# Patient Record
Sex: Male | Born: 1955 | Hispanic: Yes | Marital: Married | Smoking: Never smoker
Health system: Southern US, Community
[De-identification: ages and names within clinical notes are randomized; demographics above are authoritative.]

## PROBLEM LIST (undated history)

## (undated) DIAGNOSIS — I10 Essential (primary) hypertension: Secondary | ICD-10-CM

## (undated) DIAGNOSIS — K5900 Constipation, unspecified: Secondary | ICD-10-CM

## (undated) DIAGNOSIS — E785 Hyperlipidemia, unspecified: Secondary | ICD-10-CM

## (undated) DIAGNOSIS — C801 Malignant (primary) neoplasm, unspecified: Secondary | ICD-10-CM

## (undated) DIAGNOSIS — E119 Type 2 diabetes mellitus without complications: Secondary | ICD-10-CM

## (undated) DIAGNOSIS — M109 Gout, unspecified: Secondary | ICD-10-CM

---

## 2017-10-08 ENCOUNTER — Ambulatory Visit: Payer: Self-pay | Admitting: Physician Assistant

## 2017-10-08 ENCOUNTER — Telehealth (HOSPITAL_COMMUNITY): Payer: Self-pay | Admitting: Emergency Medicine

## 2017-10-08 ENCOUNTER — Encounter (HOSPITAL_COMMUNITY): Payer: Self-pay | Admitting: Emergency Medicine

## 2017-10-08 ENCOUNTER — Ambulatory Visit (HOSPITAL_COMMUNITY)
Admission: EM | Admit: 2017-10-08 | Discharge: 2017-10-08 | Disposition: A | Discharge status: Home / Self Care | Payer: Self-pay | Attending: Urgent Care | Admitting: Urgent Care

## 2017-10-08 ENCOUNTER — Ambulatory Visit (INDEPENDENT_AMBULATORY_CARE_PROVIDER_SITE_OTHER): Payer: Self-pay

## 2017-10-08 DIAGNOSIS — J189 Pneumonia, unspecified organism: Secondary | ICD-10-CM

## 2017-10-08 DIAGNOSIS — R0789 Other chest pain: Secondary | ICD-10-CM

## 2017-10-08 DIAGNOSIS — R05 Cough: Secondary | ICD-10-CM

## 2017-10-08 DIAGNOSIS — R059 Cough, unspecified: Secondary | ICD-10-CM

## 2017-10-08 MED ORDER — HYDROCODONE-HOMATROPINE 5-1.5 MG/5ML PO SYRP: 5.0000 mL | ORAL_SOLUTION | Freq: Every evening | ORAL | 0 refills | Status: DC | PRN

## 2017-10-08 MED ORDER — LEVOFLOXACIN 750 MG PO TABS: 750.0000 mg | ORAL_TABLET | Freq: Every day | ORAL | 0 refills | Status: DC

## 2017-10-08 NOTE — ED Provider Notes (Signed)
  MRN: [id number] DOB: [birth year]  Subjective:   Tim Vincent is a 62 y.o. male presenting for 2 week history of persistent productive cough that elicits chest pain, mild shob. Patient is currently being treated for mumps. Admits that this started out after he had wisdom tooth extraction. He was thought to have a subsequent infection from this but then was seen by another physician that started treatment for mumps.  He is also taking methisoprinol as prescribed by the same physician that is treating his mumps.  No current facility-administered medications for this encounter.   Current Outpatient Medications:  .  azithromycin (ZITHROMAX) 500 MG tablet, Take by mouth daily., Disp: , Rfl:  .  naproxen sodium (ANAPROX) 275 MG tablet, Take 275 mg by mouth 2 (two) times daily with a meal., Disp: , Rfl:  .  RIBAVIRIN PO, Take by mouth., Disp: , Rfl:    No Known Allergies. Past medical history includes mumps. Past surgical history includes wisdom tooth extraction.   Objective:   Vitals: BP (!) 143/101   Pulse 81   Temp 98 F (36.7 C)   Resp 16   SpO2 100%   Physical Exam  Constitutional: He is oriented to person, place, and time. He appears well-developed and well-nourished.  Eyes: Pupils are equal, round, and reactive to light. EOM are normal. Right eye exhibits no discharge. Left eye exhibits no discharge. No scleral icterus.  Neck:  Right sided submandibular lymphadenopathy.  Cardiovascular: Normal rate, regular rhythm, normal heart sounds and intact distal pulses. Exam reveals no gallop and no friction rub.  No murmur heard. Pulmonary/Chest: Effort normal and breath sounds normal. No stridor. No respiratory distress. He has no wheezes. He has no rales.  Diminished lung sounds bilaterally over lower lung fields.  Neurological: He is alert and oriented to person, place, and time.  Skin: Skin is warm and dry.  Psychiatric: He has a normal mood and affect.   Dg Chest 2 View  Result  Date: 10/08/2017 CLINICAL DATA:  Two weeks of coughing and chest pain. Recently diagnosed with months. Nonsmoker. EXAM: CHEST - 2 VIEW COMPARISON:  None in PACs FINDINGS: The lungs are adequately inflated. There is increased density in the left lower lobe and likely in the right lower lobe medially. There is no pleural effusion. The cardiac silhouette is enlarged. The pulmonary vascularity is not engorged. There are calcified lymph nodes in the hilar region bilaterally. The observed bony thorax is normal. IMPRESSION: Probable left lower lobe pneumonia posteriorly. Subsegmental atelectasis or early pneumonia in the right infrahilar region. Followup PA and lateral chest X-ray is recommended in 3-4 weeks following trial of antibiotic therapy to ensure resolution and exclude underlying malignancy. Mild cardiomegaly without pulmonary vascular congestion. Electronically Signed   By: David  SwazilandJordan M.D.   On: 10/08/2017 13:59   Assessment and Plan :   Community acquired pneumonia of left lung, unspecified part of lung  Community acquired pneumonia of right lung, unspecified part of lung  Cough  Atypical chest pain  Patient is to stop taking azithromycin. He is to start levofloxacin. Counseled on need for follow up, need for repeat chest x-ray in 3-4 weeks. Use supportive care otherwise for cough, pain. Recommended he follow up here or with his physician treating mumps.    Wallis BambergMani, Reyanna Baley, PA-C 10/08/17 1423

## 2017-10-08 NOTE — Discharge Instructions (Addendum)
Para el dolor de garganta intente usar un t de miel. Use 3 cucharaditas de miel con jugo exprimido de medio limn. Coloque las piezas de jengibre afeitadas en 1/2 - 1 taza de agua y caliente sobre la estufa. Luego mezcle los ingredientes y repita cada 4 horas.  

## 2017-10-08 NOTE — Telephone Encounter (Signed)
Patient's family member called reporting medication not available at pharmacy it was sent to .  Was told it could take 2 days to get hycodan.  [facility]ing [facility].  Already changed this in computer.  Notified provider

## 2017-10-08 NOTE — ED Triage Notes (Signed)
Pt presents with medications, but the formulary is not recognized by our system.  Pt had three oral medications and one liquid of unknown route.  Koptin was a medication recognized as Azithromycin and Naproxeno as Aleve.  Virazide and Metisoprinol were not recognized.

## 2017-10-08 NOTE — ED Triage Notes (Signed)
Per family member, pt c/o coughing x2 weeks, pt family states he was recently dx with mumps.

## 2017-10-09 ENCOUNTER — Telehealth (HOSPITAL_COMMUNITY): Payer: Self-pay | Admitting: Emergency Medicine

## 2017-10-09 NOTE — Telephone Encounter (Signed)
Patients family called last night.  Tim Vincent, [place] wrote a hard copy script for hycodan.  Maggie Mean, Patient Access available to call patient and instruct on hard copy script.    This afternoon 13:50, patient access staff reports patient/family did come to get script and identity verified.

## 2017-10-31 ENCOUNTER — Ambulatory Visit (INDEPENDENT_AMBULATORY_CARE_PROVIDER_SITE_OTHER): Payer: Self-pay

## 2017-10-31 ENCOUNTER — Ambulatory Visit (INDEPENDENT_AMBULATORY_CARE_PROVIDER_SITE_OTHER): Payer: Self-pay | Admitting: Emergency Medicine

## 2017-10-31 ENCOUNTER — Encounter: Payer: Self-pay | Admitting: Emergency Medicine

## 2017-10-31 VITALS — BP 173/85 | HR 60 | Temp 98.3°F | Resp 17 | Ht 66.0 in | Wt 183.0 lb

## 2017-10-31 DIAGNOSIS — R05 Cough: Secondary | ICD-10-CM

## 2017-10-31 DIAGNOSIS — R059 Cough, unspecified: Secondary | ICD-10-CM

## 2017-10-31 DIAGNOSIS — R030 Elevated blood-pressure reading, without diagnosis of hypertension: Secondary | ICD-10-CM | POA: Insufficient documentation

## 2017-10-31 MED ORDER — LISINOPRIL 10 MG PO TABS: 10.0000 mg | ORAL_TABLET | Freq: Every day | ORAL | 3 refills | Status: DC

## 2017-10-31 NOTE — Patient Instructions (Addendum)
If you have lab work done today you will be contacted with your lab results within the next 2 weeks.  If you have not heard from [place] then please contact us. The fastest way to get your results is to register for My Chart.   IF you received an x-ray today, you will receive an invoice from [facility]. Please contact [facility] at [phone] with questions or concerns regarding your invoice.   IF you received labwork today, you will receive an invoice from [facility]. Please contact [facility] at [phone] with questions or concerns regarding your invoice.   Our billing staff will not be able to assist you with questions regarding bills from these companies.  You will be contacted with the lab results as soon as they are available. The fastest way to get your results is to activate your My Chart account. Instructions are located on the last page of this paperwork. If you have not heard from [place] regarding the results in 2 weeks, please contact this office.      Hipertensin Hypertension La hipertensin, conocida comnmente como presin arterial alta, se produce cuando la sangre bombea en las arterias con mucha fuerza. Las arterias son los vasos sanguneos que transportan la sangre desde el corazn al resto del cuerpo. La hipertensin hace que el corazn haga ms esfuerzo para Insurance account manager y [employer] que las arterias se Armed forces training and education officer o Multimedia programmer. La hipertensin no tratada o no controlada puede causar infarto de miocardio, accidentes cerebrovasculares, enfermedad renal y otros problemas. Una lectura de la presin arterial consiste de un nmero ms alto sobre un nmero ms bajo. En condiciones ideales, la presin arterial debe estar por debajo de 120/80. El primer nmero ("superior") es la presin sistlica. Es la medida de la presin de las arterias cuando el corazn late. El segundo nmero ("inferior") es la presin diastlica. Es la medida de la presin en las arterias  cuando el corazn se relaja. Cules son las causas? Se desconoce la causa de esta afeccin. Qu incrementa el riesgo? Algunos factores de riesgo de hipertensin estn bajo su control. Otros no. Factores que puede [employer].  Tener diabetes mellitus tipo 2, colesterol alto, o ambos.  No hacer la cantidad suficiente de actividad fsica o ejercicio.  Tener sobrepeso.  Consumir mucha grasa, azcar, caloras o sal (sodio) en su dieta.  Beber alcohol en exceso. Factores que son difciles o imposibles de modificar  Tener enfermedad renal crnica.  Tener antecedentes familiares de presin arterial alta.  La edad. Los riesgos aumentan con la edad.  La raza. El riesgo es mayor para las Statistician.  El sexo. Antes de los 45aos, los hombres corren ms [employer]. Despus de los 65aos, las mujeres corren ms [employer].  Tener apnea obstructiva del sueo.  El estrs. Cules son los signos o los sntomas? La presin arterial extremadamente alta (crisis hipertensiva) puede provocar:  Dolor de [place].  Ansiedad.  Falta de aire.  Hemorragia nasal.  Nuseas y vmitos.  Dolor de pecho intenso.  Una crisis de movimientos que no puede controlar (convulsiones).  Cmo se diagnostica? Esta afeccin se diagnostica midiendo su presin arterial mientras se encuentra sentado, con el brazo apoyado sobre una superficie. El brazalete del tensimetro debe colocarse directamente sobre la piel de la parte superior del brazo y al nivel de su corazn. Debe medirla al [facility] veces en el mismo brazo. Determinadas condiciones pueden causar una diferencia de presin arterial entre  el brazo izquierdo y Aeronautical engineer. Ciertos factores pueden provocar que las lecturas de la presin arterial sean inferiores o superiores a lo normal (elevadas) por un perodo corto de tiempo:  Si su presin arterial es ms alta cuando se encuentra en el consultorio del  mdico que cuando la mide en su hogar, se denomina "hipertensin de bata blanca". La Harley[employer]Davidson de las personas que tienen esta afeccin no deben ser [employer].  Si su presin arterial es ms alta en el hogar que cuando se encuentra en el consultorio del mdico, se denomina "hipertensin enmascarada". La Harley[employer]Davidson de las personas que tienen esta afeccin deben ser medicadas para Chief Operating Officer la presin arterial.  Si tiene una lecturas de presin arterial alta durante una visita o si tiene presin arterial normal con otros factores de riesgo:  Es posible que se le pida que regrese Banker para volver a Chief Operating Officer su presin arterial.  Se le puede pedir que se controle la presin arterial en su casa durante 1 semana o ms.  Si se le diagnostica hipertensin, es posible que se le realicen otros anlisis de sangre o estudios de diagnstico por imgenes para ayudar a su mdico a comprender su riesgo general de tener otras afecciones. Cmo se trata? Esta afeccin se trata haciendo cambios saludables en el estilo de vida, tales como ingerir alimentos saludables, realizar ms ejercicio y reducir el consumo de alcohol. El mdico puede recetarle medicamentos si los cambios en el estilo de vida no son suficientes para Museum/gallery curator la presin arterial y si:  Su presin arterial sistlica est por encima de 130.  Su presin arterial diastlica est por encima de 80.  La presin arterial deseada puede variar en funcin de las enfermedades, la edad y otros factores personales. Siga estas instrucciones en su casa: Comida y bebida  Siga una dieta con alto contenido de fibras y [place], y con bajo contenido de sodio, International aid/development worker agregada y Neurosurgeon. Un ejemplo de plan alimenticio es la dieta DASH (Dietary Approaches to Stop Hypertension, Mtodos alimenticios para detener la hipertensin). Para alimentarse de esta manera: ? Coma mucha fruta y verdura fresca. Trate de que la mitad del plato de cada comida sea de frutas y  verduras. ? Coma cereales integrales, como pasta integral, arroz integral y pan integral. Llene aproximadamente un cuarto del plato con cereales integrales. ? Coma y beba productos lcteos con bajo contenido de grasa, como leche descremada o yogur bajo en grasas. ? Evite la ingesta de cortes de carne grasa, carne procesada o curada, y carne de ave con piel. Llene aproximadamente un cuarto del plato con protenas magras, como pescado, pollo sin piel, frijoles, huevos y tofu. ? Evite ingerir alimentos prehechos o procesados. En general, estos tienen mayor cantidad de sodio, azcar agregada y Steffanie [place].  Reduzca su ingesta diaria de sodio. La mayora de las personas que tienen hipertensin deben comer menos de 1500 mg de sodio por [employer].  Limite el consumo de alcohol a no ms de 1 medida por da si es mujer y no est Orthoptist y a 2 medidas por da si es hombre. Una medida equivale a 12onzas de cerveza, 5onzas de vino o 1onzas de bebidas alcohlicas de alta graduacin. Estilo de vida  Trabaje con su mdico para mantener un peso saludable o Curator. Pregntele cual es su peso recomendado.  Realice al menos 30 minutos de ejercicio que haga que se acelere su corazn (ejercicio Magazine features editor) la DIRECTV de la [place]. Estas actividades pueden incluir  caminar, nadar o andar en bicicleta.  Incluya ejercicios para fortalecer sus msculos (ejercicios de resistencia), como pilates o levantamiento de pesas, como parte de su rutina semanal de ejercicios. Intente realizar 30minutos de este tipo de ejercicios al Kelloggmenos tres das a la Lock[place]semana.  No consuma ningn producto que contenga nicotina o tabaco, como cigarrillos y Administrator, Civil Servicecigarrillos electrnicos. Si necesita ayuda para dejar de fumar, consulte al mdico.  Contrlese la presin arterial en su casa segn las indicaciones del mdico.  Concurra a todas las visitas de control como se lo haya indicado el mdico. Esto es importante. Medicamentos  Intel Corporationome los  medicamentos de venta libre y los recetados solamente como se lo haya indicado el mdico. Siga cuidadosamente las indicaciones. Los medicamentos para la presin arterial deben tomarse segn las indicaciones.  No omita las dosis de medicamentos para la presin arterial. Si lo hace, estar en riesgo de tener problemas y puede hacer que los medicamentos sean menos eficaces.  Pregntele a su mdico a qu efectos secundarios o reacciones a los Museum/gallery curatormedicamentos debe prestar atencin. Comunquese con un mdico si:  Piensa que tiene una reaccin a un medicamento que est tomando.  Tiene dolores de cabeza frecuentes (recurrentes).  Siente mareos.  Tiene hinchazn en los tobillos.  Tiene problemas de visin. Solicite ayuda de inmediato si:  Siente un dolor de cabeza intenso o confusin.  Siente debilidad inusual o adormecimiento.  Siente que va a desmayarse.  Siente un dolor intenso en el pecho o el abdomen.  Vomita repetidas veces.  Tiene dificultad para respirar. Resumen  La hipertensin se produce cuando la sangre bombea en las arterias con mucha fuerza. Si esta afeccin no se controla, podra correr riesgo de tener complicaciones graves.  La presin arterial deseada puede variar en funcin de las enfermedades, la edad y otros factores personales. Para la Franklin Resourcesmayora de las personas, una presin arterial normal es menor que 120/80.  La hipertensin se trata con cambios en el estilo de vida, medicamentos o una combinacin de [place]. Los Danaher Corporationcambios en el estilo de vida incluyen prdida de peso, ingerir alimentos sanos, seguir una dieta baja en sodio, hacer ms ejercicio y Glass blower/designerlimitar el consumo de alcohol. Esta informacin no tiene Theme park managercomo fin reemplazar el consejo del mdico. Asegrese de hacerle al mdico cualquier pregunta que tenga. Document Released: 02/25/2005 Document Revised: 02/07/2016 Document Reviewed: 02/07/2016 Elsevier Interactive Patient Education  [employer]2018 Elsevier Inc.

## 2017-10-31 NOTE — Progress Notes (Signed)
Tim Vincent 62 y.o.   Chief Complaint  Patient presents with  . Follow-up    pneumonia     HISTORY OF PRESENT ILLNESS: This is a 62 y.o. male seen at urgent care center on 10/08/2017 and diagnosed with pneumonia.  Finished course of Levaquin 7 days ago.  Feeling better.  Has no complaints except dry cough.  Here for follow-up.  HPI   Prior to Admission medications   Medication Sig Start Date End Date Taking? Authorizing Provider  azithromycin (ZITHROMAX) 500 MG tablet Take by mouth daily.    [provider]  HYDROcodone-homatropine (HYCODAN) 5-1.5 MG/5ML syrup Take 5 mLs by mouth at bedtime as needed. Patient not taking: Reported on 10/31/2017 10/08/17   Wallis Bamberg, PA-C  levofloxacin (LEVAQUIN) 750 MG tablet Take 1 tablet (750 mg total) by mouth daily. Patient not taking: Reported on 10/31/2017 10/08/17   Wallis Bamberg, PA-C  naproxen sodium (ANAPROX) 275 MG tablet Take 275 mg by mouth 2 (two) times daily with a meal.    [provider]  RIBAVIRIN PO Take by mouth.    [provider]    No Known Allergies  There are no active problems to display for this patient.   No past medical history on file.  No past surgical history on file.  Social History   Socioeconomic History  . Marital status: Married    Spouse name: Not on file  . Number of children: Not on file  . Years of education: Not on file  . Highest education level: Not on file  Occupational History  . Not on file  Social Needs  . Financial resource strain: Not on file  . Food insecurity:    Worry: Not on file    Inability: Not on file  . Transportation needs:    Medical: Not on file    Non-medical: Not on file  Tobacco Use  . Smoking status: Never Smoker  . Smokeless tobacco: Never Used  Substance and Sexual Activity  . Alcohol use: Not on file  . Drug use: Not on file  . Sexual activity: Not on file  Lifestyle  . Physical activity:    Days per week: Not on file    Minutes  per session: Not on file  . Stress: Not on file  Relationships  . Social connections:    Talks on phone: Not on file    Gets together: Not on file    Attends religious service: Not on file    Active member of club or organization: Not on file    Attends meetings of clubs or organizations: Not on file    Relationship status: Not on file  . Intimate partner violence:    Fear of current or ex partner: Not on file    Emotionally abused: Not on file    Physically abused: Not on file    Forced sexual activity: Not on file  Other Topics Concern  . Not on file  Social History Narrative  . Not on file    No family history on file.   Review of Systems  Constitutional: Negative.  Negative for chills and fever.  HENT: Negative for congestion, nosebleeds and sore throat.   Eyes: Negative.  Negative for blurred vision and double vision.  Respiratory: Positive for cough. Negative for sputum production, shortness of breath and wheezing.   Cardiovascular: Negative.  Negative for chest pain, palpitations, orthopnea, leg swelling and PND.  Gastrointestinal: Negative for abdominal pain, nausea and vomiting.  Genitourinary: Negative.   Skin: Negative.  Negative for rash.  Neurological: Negative.   Endo/Heme/Allergies: Negative.   All other systems reviewed and are negative.   Vitals:   10/31/17 1006  BP: (!) 173/85  Pulse: 60  Resp: 17  Temp: 98.3 F (36.8 C)  SpO2: 98%   BP Readings from Last 3 Encounters:  10/31/17 (!) 173/85  10/08/17 (!) 143/101    Dg Chest 2 View  Result Date: 10/31/2017 CLINICAL DATA:  Pneumonia follow-up. EXAM: CHEST - 2 VIEW COMPARISON:  Chest x-ray dated October 08, 2017. FINDINGS: Stable mild cardiomegaly. Normal pulmonary vascularity. No focal consolidation, pleural effusion, or pneumothorax. No acute osseous abnormality. IMPRESSION: Resolved left lower lobe pneumonia. No active cardiopulmonary disease. Electronically Signed   By: Obie Dredge M.D.   On:  10/31/2017 10:55    Physical Exam  Constitutional: He appears well-developed and well-nourished.  HENT:  Head: Normocephalic and atraumatic.  Nose: Nose normal.  Mouth/Throat: Oropharynx is clear and moist.  Eyes: Pupils are equal, round, and reactive to light. Conjunctivae and EOM are normal.  Neck: Normal range of motion.  Cardiovascular: Normal rate, regular rhythm and normal heart sounds.  Pulmonary/Chest: Effort normal and breath sounds normal.  Abdominal: Soft. There is no tenderness.  Musculoskeletal: Normal range of motion. He exhibits no edema or tenderness.  Lymphadenopathy:    He has no cervical adenopathy.  Neurological: He is alert. No sensory deficit. He exhibits normal muscle tone.  Skin: Skin is warm and dry. Capillary refill takes less than 2 seconds.  Psychiatric: He has a normal mood and affect.  Vitals reviewed.    ASSESSMENT & PLAN: Tim Vincent was seen today for follow-up.  Diagnoses and all orders for this visit:  Cough -     DG Chest 2 View; Future  Elevated blood pressure reading -     lisinopril (PRINIVIL,ZESTRIL) 10 MG tablet; Take 1 tablet (10 mg total) by mouth daily.    Patient Instructions       If you have lab work done today you will be contacted with your lab results within the next 2 weeks.  If you have not heard from [place] then please contact us. The fastest way to get your results is to register for My Chart.   IF you received an x-ray today, you will receive an invoice from [facility]. Please contact [facility] at [phone] with questions or concerns regarding your invoice.   IF you received labwork today, you will receive an invoice from [place]. Please contact [facility] at [phone] with questions or concerns regarding your invoice.   Our billing staff will not be able to assist you with questions regarding bills from these companies.  You will be contacted with the lab results as soon as they are available.  The fastest way to get your results is to activate your My Chart account. Instructions are located on the last page of this paperwork. If you have not heard from [place] regarding the results in 2 weeks, please contact this office.      Hipertensin Hypertension La hipertensin, conocida comnmente como presin arterial alta, se produce cuando la sangre bombea en las arterias con mucha fuerza. Las arterias son los vasos sanguneos que transportan la sangre desde el corazn al resto del cuerpo. La hipertensin hace que el corazn haga ms esfuerzo para Insurance account manager y [employer] que las arterias se Armed forces training and education officer o Multimedia programmer. La hipertensin no tratada o no controlada puede causar infarto de miocardio, accidentes cerebrovasculares,  enfermedad renal y otros problemas. Una lectura de la presin arterial consiste de un nmero ms alto sobre un nmero ms bajo. En condiciones ideales, la presin arterial debe estar por debajo de 120/80. El primer nmero ("superior") es la presin sistlica. Es la medida de la presin de las arterias cuando el corazn late. El segundo nmero ("inferior") es la presin diastlica. Es la medida de la presin en las arterias cuando el corazn se relaja. Cules son las causas? Se desconoce la causa de esta afeccin. Qu incrementa el riesgo? Algunos factores de riesgo de hipertensin estn bajo su control. Otros no. Factores que puede [employer].  Tener diabetes mellitus tipo 2, colesterol alto, o ambos.  No hacer la cantidad suficiente de actividad fsica o ejercicio.  Tener sobrepeso.  Consumir mucha grasa, azcar, caloras o sal (sodio) en su dieta.  Beber alcohol en exceso. Factores que son difciles o imposibles de modificar  Tener enfermedad renal crnica.  Tener antecedentes familiares de presin arterial alta.  La edad. Los riesgos aumentan con la edad.  La raza. El riesgo es mayor para las Statistician.  El sexo. Antes de los  45aos, los hombres corren ms [employer]. Despus de los 65aos, las mujeres corren ms [employer].  Tener apnea obstructiva del sueo.  El estrs. Cules son los signos o los sntomas? La presin arterial extremadamente alta (crisis hipertensiva) puede provocar:  Dolor de [place].  Ansiedad.  Falta de aire.  Hemorragia nasal.  Nuseas y vmitos.  Dolor de pecho intenso.  Una crisis de movimientos que no puede controlar (convulsiones).  Cmo se diagnostica? Esta afeccin se diagnostica midiendo su presin arterial mientras se encuentra sentado, con el brazo apoyado sobre una superficie. El brazalete del tensimetro debe colocarse directamente sobre la piel de la parte superior del brazo y al nivel de su corazn. Debe medirla al [facility] veces en el mismo brazo. Determinadas condiciones pueden causar una diferencia de presin arterial entre el brazo izquierdo y Aeronautical engineer. Ciertos factores pueden provocar que las lecturas de la presin arterial sean inferiores o superiores a lo normal (elevadas) por un perodo corto de tiempo:  Si su presin arterial es ms alta cuando se encuentra en el consultorio del mdico que cuando la mide en su hogar, se denomina "hipertensin de bata blanca". La Harley[employer]Davidson de las personas que tienen esta afeccin no deben ser [employer].  Si su presin arterial es ms alta en el hogar que cuando se encuentra en el consultorio del mdico, se denomina "hipertensin enmascarada". La Harley[employer]Davidson de las personas que tienen esta afeccin deben ser medicadas para Chief Operating Officer la presin arterial.  Si tiene una lecturas de presin arterial alta durante una visita o si tiene presin arterial normal con otros factores de riesgo:  Es posible que se le pida que regrese Banker para volver a Chief Operating Officer su presin arterial.  Se le puede pedir que se controle la presin arterial en su casa durante 1 semana o ms.  Si se le diagnostica hipertensin, es  posible que se le realicen otros anlisis de sangre o estudios de diagnstico por imgenes para ayudar a su mdico a comprender su riesgo general de tener otras afecciones. Cmo se trata? Esta afeccin se trata haciendo cambios saludables en el estilo de vida, tales como ingerir alimentos saludables, realizar ms ejercicio y reducir el consumo de alcohol. El mdico puede recetarle medicamentos si los cambios en el estilo de vida no son suficientes para Personnel officer  controlar la presin arterial y si:  Su presin arterial sistlica est por encima de 130.  Su presin arterial diastlica est por encima de 80.  La presin arterial deseada puede variar en funcin de las enfermedades, la edad y otros factores personales. Siga estas instrucciones en su casa: Comida y bebida  Siga una dieta con alto contenido de fibras y Tancred, y con bajo contenido de sodio, International aid/development worker agregada y Neurosurgeon. Un ejemplo de plan alimenticio es la dieta DASH (Dietary Approaches to Stop Hypertension, Mtodos alimenticios para detener la hipertensin). Para alimentarse de esta manera: ? Coma mucha fruta y verdura fresca. Trate de que la mitad del plato de cada comida sea de frutas y verduras. ? Coma cereales integrales, como pasta integral, arroz integral y pan integral. Llene aproximadamente un cuarto del plato con cereales integrales. ? Coma y beba productos lcteos con bajo contenido de grasa, como leche descremada o yogur bajo en grasas. ? Evite la ingesta de cortes de carne grasa, carne procesada o curada, y carne de ave con piel. Llene aproximadamente un cuarto del plato con protenas magras, como pescado, pollo sin piel, frijoles, huevos y tofu. ? Evite ingerir alimentos prehechos o procesados. En general, estos tienen mayor cantidad de sodio, azcar agregada y Steffanie Rain[place].  Reduzca su ingesta diaria de sodio. La mayora de las personas que tienen hipertensin deben comer menos de 1500 mg de sodio por [employer].  Limite el consumo de  alcohol a no ms de 1 medida por da si es mujer y no est Orthoptist y a 2 medidas por da si es hombre. Una medida equivale a 12onzas de cerveza, 5onzas de vino o 1onzas de bebidas alcohlicas de alta graduacin. Estilo de vida  Trabaje con su mdico para mantener un peso saludable o Curator. Pregntele cual es su peso recomendado.  Realice al menos 30 minutos de ejercicio que haga que se acelere su corazn (ejercicio Magazine features editor) la DIRECTV de la [place]. Estas actividades pueden incluir caminar, nadar o andar en bicicleta.  Incluya ejercicios para fortalecer sus msculos (ejercicios de resistencia), como pilates o levantamiento de pesas, como parte de su rutina semanal de ejercicios. Intente realizar de este tipo de ejercicios al Kellogg a la [place].  No consuma ningn producto que contenga nicotina o tabaco, como cigarrillos y Administrator, Civil Service. Si necesita ayuda para dejar de fumar, consulte al mdico.  Contrlese la presin arterial en su casa segn las indicaciones del mdico.  Concurra a todas las visitas de control como se lo haya indicado el mdico. Esto es importante. Medicamentos  [employer] de venta libre y los recetados solamente como se lo haya indicado el mdico. Siga cuidadosamente las indicaciones. Los medicamentos para la presin arterial deben tomarse segn las indicaciones.  No omita las dosis de medicamentos para la presin arterial. Si lo hace, estar en riesgo de tener problemas y puede hacer que los medicamentos sean menos eficaces.  Pregntele a su mdico a qu efectos secundarios o reacciones a los Museum/gallery curator. Comunquese con un mdico si:  Piensa que tiene una reaccin a un medicamento que est tomando.  Tiene dolores de cabeza frecuentes (recurrentes).  Siente mareos.  Tiene hinchazn en los tobillos.  Tiene problemas de visin. Solicite ayuda de inmediato si:  Siente un dolor  de cabeza intenso o confusin.  Siente debilidad inusual o adormecimiento.  Siente que va a desmayarse.  Siente un dolor intenso en el pecho o el abdomen.  Vomita repetidas veces.  Tiene dificultad para respirar. Resumen  La hipertensin se produce cuando la sangre bombea en las arterias con mucha fuerza. Si esta afeccin no se controla, podra correr riesgo de tener complicaciones graves.  La presin arterial deseada puede variar en funcin de las enfermedades, la edad y otros factores personales. Para la Franklin Resourcesmayora de las personas, una presin arterial normal es menor que 120/80.  La hipertensin se trata con cambios en el estilo de vida, medicamentos o una combinacin de [place]. Los Danaher Corporationcambios en el estilo de vida incluyen prdida de peso, ingerir alimentos sanos, seguir una dieta baja en sodio, hacer ms ejercicio y Glass blower/designerlimitar el consumo de alcohol. Esta informacin no tiene Theme park managercomo fin reemplazar el consejo del mdico. Asegrese de hacerle al mdico cualquier pregunta que tenga. Document Released: 02/25/2005 Document Revised: 02/07/2016 Document Reviewed: 02/07/2016 Elsevier Interactive Patient Education  2018 Elsevier Inc.      Edwina BarthMiguel Natalina Wieting, MD [facility]

## 2017-11-19 ENCOUNTER — Ambulatory Visit: Payer: Self-pay | Admitting: Emergency Medicine

## 2020-03-21 ENCOUNTER — Ambulatory Visit: Payer: Self-pay | Admitting: Emergency Medicine

## 2020-04-19 ENCOUNTER — Ambulatory Visit: Payer: Medicare Other | Admitting: Emergency Medicine

## 2020-04-20 ENCOUNTER — Encounter: Payer: Self-pay | Admitting: Emergency Medicine

## 2020-07-28 ENCOUNTER — Other Ambulatory Visit: Payer: Self-pay | Admitting: Internal Medicine

## 2020-07-28 DIAGNOSIS — R7989 Other specified abnormal findings of blood chemistry: Secondary | ICD-10-CM

## 2020-08-17 ENCOUNTER — Ambulatory Visit
Admission: RE | Admit: 2020-08-17 | Discharge: 2020-08-17 | Disposition: A | Discharge status: Home / Self Care | Payer: Medicare Other | Source: Ambulatory Visit | Attending: Internal Medicine | Admitting: Internal Medicine

## 2020-08-17 DIAGNOSIS — R7989 Other specified abnormal findings of blood chemistry: Secondary | ICD-10-CM

## 2020-09-27 ENCOUNTER — Ambulatory Visit: Payer: Medicare Other | Admitting: Podiatry

## 2020-12-27 ENCOUNTER — Ambulatory Visit: Payer: Medicare Other | Admitting: Podiatry

## 2021-01-03 ENCOUNTER — Encounter: Payer: Self-pay | Admitting: Plastic Surgery

## 2021-01-03 ENCOUNTER — Other Ambulatory Visit: Payer: Self-pay

## 2021-01-03 ENCOUNTER — Ambulatory Visit (INDEPENDENT_AMBULATORY_CARE_PROVIDER_SITE_OTHER): Payer: Medicare Other | Admitting: Plastic Surgery

## 2021-01-03 VITALS — BP 210/91 | HR 64 | Ht 66.0 in | Wt 189.0 lb

## 2021-01-03 DIAGNOSIS — M1A9XX1 Chronic gout, unspecified, with tophus (tophi): Secondary | ICD-10-CM

## 2021-01-03 NOTE — Progress Notes (Signed)
   Referring Provider Caro Laroche, MD [address] [place],  [place] [address]   CC:  Chief Complaint  Patient presents with   Advice Only      Tim Vincent is an 65 y.o. male.  HPI: Patient presents with bilateral elbow masses.  The been present for greater than 20 years and getting larger.  They are interfering with function to a certain degree and he is interested in considering surgical treatment.  He reports occasional numbness in his left arm that comes and goes.  This does not involve his hands but is more in the area of the dorsal forearm.  Is not currently numb today.  Denies neck pain.  No Known Allergies  Outpatient Encounter Medications as of 01/03/2021  Medication Sig   lisinopril (PRINIVIL,ZESTRIL) 10 MG tablet Take 1 tablet (10 mg total) by mouth daily.   naproxen sodium (ANAPROX) 275 MG tablet Take 275 mg by mouth 2 (two) times daily with a meal.   RIBAVIRIN PO Take by mouth.   azithromycin (ZITHROMAX) 500 MG tablet Take by mouth daily. (Patient not taking: Reported on 01/03/2021)   [DISCONTINUED] HYDROcodone-homatropine (HYCODAN) 5-1.5 MG/5ML syrup Take 5 mLs by mouth at bedtime as needed. (Patient not taking: Reported on 10/31/2017)   [DISCONTINUED] levofloxacin (LEVAQUIN) 750 MG tablet Take 1 tablet (750 mg total) by mouth daily. (Patient not taking: No sig reported)   No facility-administered encounter medications on file as of 01/03/2021.     No past medical history on file.  No past surgical history on file.  No family history on file.  Social History   Social History Narrative   Not on file    Review of Systems General: Denies fevers, chills, weight loss CV: Denies chest pain, shortness of breath, palpitations  Physical Exam Vitals with BMI 01/03/2021 10/31/2017 10/08/2017  Height 5\' 6"  5\' 6"  -  Weight 189 lbs 183 lbs -  BMI 30.52 29.55 -  Systolic 210 173  Diastolic 91 85 101  Pulse 64 60 81    General:  No acute  distress,  Alert and oriented, Non-Toxic, Normal speech and affect Examination shows significant subcutaneous masses in the area of the olecranon bilaterally.  These extend longitudinally superior and inferior to the olecranon.  No overlying skin ulceration.  Both hands are neurovascularly intact with normal strength and sensation.  These masses appear to be gouty tophi.  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  They have been present for a significant period of time but seem to be getting worse.  We will plan to send him for x-rays to evaluate the joint overall.  He may require surgical excision of these.  This was discussed through his daughter who acted as his interpreter.  All of his questions were answered.  01/03/2021, 6:01 PM

## 2021-01-04 ENCOUNTER — Ambulatory Visit (HOSPITAL_BASED_OUTPATIENT_CLINIC_OR_DEPARTMENT_OTHER)
Admission: RE | Admit: 2021-01-04 | Discharge: 2021-01-04 | Disposition: A | Discharge status: Home / Self Care | Payer: Medicare Other | Source: Ambulatory Visit | Attending: Plastic Surgery | Admitting: Plastic Surgery

## 2021-01-04 DIAGNOSIS — M19021 Primary osteoarthritis, right elbow: Secondary | ICD-10-CM | POA: Diagnosis not present

## 2021-01-04 DIAGNOSIS — M1A9XX1 Chronic gout, unspecified, with tophus (tophi): Secondary | ICD-10-CM | POA: Diagnosis not present

## 2021-01-04 DIAGNOSIS — M19022 Primary osteoarthritis, left elbow: Secondary | ICD-10-CM | POA: Diagnosis not present

## 2021-01-11 ENCOUNTER — Other Ambulatory Visit: Payer: Self-pay

## 2021-01-11 ENCOUNTER — Ambulatory Visit (INDEPENDENT_AMBULATORY_CARE_PROVIDER_SITE_OTHER): Payer: Medicare Other | Admitting: Plastic Surgery

## 2021-01-11 DIAGNOSIS — M1A9XX1 Chronic gout, unspecified, with tophus (tophi): Secondary | ICD-10-CM | POA: Diagnosis not present

## 2021-01-11 NOTE — Progress Notes (Signed)
   Referring Provider No referring provider defined for this encounter.   CC: No chief complaint on file.     Tim Vincent is an 65 y.o. male.  HPI: Patient presents for follow-up after x-rays of both elbows.  The elbow joints themselves look to be normal.  I suspect that the masses at both elbows are gouty tophi.  He is interested in having the 1 on the left side removed first.  Review of Systems General: Denies fevers and chills  Physical Exam Vitals with BMI 01/03/2021 10/31/2017 10/08/2017  Height 5\' 6"  5\' 6"  -  Weight 189 lbs 183 lbs -  BMI 30.52 29.55 -  Systolic 210 173  Diastolic 91 85 101  Pulse 64 60 81    General:  No acute distress,  Alert and oriented, Non-Toxic, Normal speech and affect Examination unchanged.  Large gouty tophi both elbows.  Hands neurovascularly intact.  Assessment/Plan Patient presents with what I suspect are gouty tophi bilateral elbows.  We discussed excision 1 at a time.  We discussed the risks include bleeding, infection, damage to surrounding structures and need for additional procedures.  We discussed that the ulnar nerve is close to the excision but should be able to be protected.  We discussed the potential for wound healing complications postoperatively.  Patient is interested in moving forward and plan to try to schedule this sometime in January after he is finished some upcoming travel.  All of his questions were answered.  008 01/11/2021, 1:46 PM

## 2021-01-12 ENCOUNTER — Ambulatory Visit (INDEPENDENT_AMBULATORY_CARE_PROVIDER_SITE_OTHER): Payer: Medicare Other | Admitting: Podiatry

## 2021-01-12 ENCOUNTER — Encounter: Payer: Self-pay | Admitting: Podiatry

## 2021-01-12 DIAGNOSIS — E1140 Type 2 diabetes mellitus with diabetic neuropathy, unspecified: Secondary | ICD-10-CM | POA: Insufficient documentation

## 2021-01-12 DIAGNOSIS — M2010 Hallux valgus (acquired), unspecified foot: Secondary | ICD-10-CM

## 2021-01-12 DIAGNOSIS — E119 Type 2 diabetes mellitus without complications: Secondary | ICD-10-CM | POA: Insufficient documentation

## 2021-01-12 DIAGNOSIS — R209 Unspecified disturbances of skin sensation: Secondary | ICD-10-CM | POA: Insufficient documentation

## 2021-01-12 DIAGNOSIS — E669 Obesity, unspecified: Secondary | ICD-10-CM | POA: Insufficient documentation

## 2021-01-12 DIAGNOSIS — E1142 Type 2 diabetes mellitus with diabetic polyneuropathy: Secondary | ICD-10-CM

## 2021-01-12 DIAGNOSIS — I10 Essential (primary) hypertension: Secondary | ICD-10-CM | POA: Insufficient documentation

## 2021-01-12 NOTE — Progress Notes (Signed)
This patient presents the office for diabetic foot exam .  Patient is diabetic and is  not taking  Metformin.  He says he has developed a skin lesion on the outside of his right ankle which has taken long in healing.  He  feels numbness through his feet.  He presents to the office for diabetic foot exam.  He presents to the office with young male interpreter.  Vascular  Dorsalis pedis and posterior tibial pulses are palpable  B/L.  Capillary return  WNL.  Temperature gradient is  WNL.  Skin turgor  WNL  Sensorium  Senn Weinstein monofilament wire  absent . Normal tactile sensation.  Nail Exam  Patient has normal nails with no evidence of bacterial or fungal infection.  Orthopedic  Exam  Muscle tone and muscle strength  WNL.  No limitations of motion feet  B/L.  No crepitus or joint effusion noted.  Hammer toes 2-5  B/L.   Bony prominences are unremarkable.  HAV  B/L.  Cyst noted on lateral achilles tendon right leg which is asymptomatic.  Skin  No open lesions.  Normal skin texture and turgor. Healing ulcer on the lateral malleous right ankle.  Diabetic neuropathy.  Healing skin lesion right ankle.  IE.  Diabetic foot exam reveals no evidence of vascular pathology.  Patient has diminished/absent  LOPS  B/L.  Patient qualifies for diabetic shoes due to DPN and  HAV  B/L.  Discuused obtaining diabetic shoes next year.  Also recommended he be evaluated for his neuropathy by his doctor. Marland Kitchen  RTC 1 year for annual foot exam.   Helane Gunther DPM

## 2021-02-15 ENCOUNTER — Telehealth: Payer: Self-pay | Admitting: Plastic Surgery

## 2021-02-15 NOTE — Telephone Encounter (Signed)
Patient's daughter called to find out when her father could be scheduled for surgery. Called her back and had to leave a message. Advised patient's daughter that because surgery is happening in January, his insurance plan will not let me do an authorization too far in advance so I will attempt to get surgery authorized later this month or first of the year. Once I hear from insurance, I will give them a call to advise of status.

## 2021-03-13 NOTE — Progress Notes (Deleted)
Patient ID: Tim Vincent, male    DOB: 1955/10/30, 66 y.o.   MRN: [id number]  No chief complaint on file.   No diagnosis found.   History of Present Illness: Tim Vincent is a 66 y.o.  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  He presents for preoperative evaluation for upcoming procedure, left elbow gouty tophi excision, scheduled for 03/27/2021 with Dr. Claudia Desanctis.  The patient {HAS HAS [id number] had problems with anesthesia. ***  Summary of Previous Visit: Patient was seen most recently on 01/11/2021 by Dr. Claudia Desanctis.  While patient has masses over bilateral elbows, he would prefer excision wanted time.  Risks and benefits discussed.  Patient expressed continued interest in surgical intervention.  Job: ***  PMH Significant for: Type II DM, HTN, bilateral elbow masses.   Past Medical History: Allergies: No Known Allergies  Current Medications:  Current Outpatient Medications:    lisinopril (PRINIVIL,ZESTRIL) 10 MG tablet, Take 1 tablet (10 mg total) by mouth daily., Disp: 90 tablet, Rfl: 3  Past Medical Problems: No past medical history on file.  Past Surgical History: No past surgical history on file.  Social History: Social History   Socioeconomic History   Marital status: Married    Spouse name: Not on file   Number of children: Not on file   Years of education: Not on file   Highest education level: Not on file  Occupational History   Not on file  Tobacco Use   Smoking status: Never   Smokeless tobacco: Never  Substance and Sexual Activity   Alcohol use: Not on file   Drug use: Not on file   Sexual activity: Not on file  Other Topics Concern   Not on file  Social History Narrative   Not on file   Social Determinants of Health   Financial Resource Strain: Not on file  Food Insecurity: Not on file  Transportation Needs: Not on file  Physical Activity: Not on file  Stress: Not on file  Social Connections: Not on file  Intimate  Partner Violence: Not on file    Family History: No family history on file.  Review of Systems: ROS  Physical Exam: Vital Signs There were no vitals taken for this visit.  Physical Exam *** Constitutional:      General: Not in acute distress.    Appearance: Normal appearance. Not ill-appearing.  HENT:     Head: Normocephalic and atraumatic.  Eyes:     Pupils: Pupils are equal, round. Cardiovascular:     Rate and Rhythm: Normal rate.    Pulses: Normal pulses.  Pulmonary:     Effort: No respiratory distress or increased work of breathing.  Speaks in full sentences. Abdominal:     General: Abdomen is flat. No distension.   Musculoskeletal: Normal range of motion. No lower extremity swelling or edema. No varicosities. *** Skin:    General: Skin is warm and dry.     Findings: No erythema or rash.  Neurological:     Mental Status: Alert and oriented to person, place, and time.  Psychiatric:        Mood and Affect: Mood normal.        Behavior: Behavior normal.    Assessment/Plan: The patient is scheduled for *** with Dr. Donne Hazel.  Risks, benefits, and alternatives of procedure discussed, questions answered and consent obtained.    Smoking Status: ***; Counseling Given? *** Last Mammogram: ***; Results: ***  Caprini Score: ***;  Risk Factors include: ***, BMI *** 25, and length of planned surgery. Recommendation for mechanical *** pharmacological prophylaxis. Encourage early ambulation.   Pictures obtained: ***  Post-op Rx sent to pharmacy: ***  Patient was provided with the *** General Surgical Risk consent document and Pain Medication Agreement prior to their appointment.  They had adequate time to read through the risk consent documents and Pain Medication Agreement. We also discussed them in person together during this preop appointment. All of their questions were answered to their satisfaction.  Recommended calling if they have any  further questions.  Risk consent form and Pain Medication Agreement to be scanned into patient's chart.  ***   Electronically signed by: Krista Blue, PA-C 03/13/2021 1:40 PM

## 2021-03-15 ENCOUNTER — Encounter: Payer: Medicare Other | Admitting: Physician Assistant

## 2021-03-22 ENCOUNTER — Other Ambulatory Visit: Payer: Self-pay

## 2021-03-22 ENCOUNTER — Ambulatory Visit (INDEPENDENT_AMBULATORY_CARE_PROVIDER_SITE_OTHER): Payer: Medicaid Other | Admitting: Physician Assistant

## 2021-03-22 VITALS — BP 194/86 | HR 70 | Ht 67.0 in | Wt 185.6 lb

## 2021-03-22 DIAGNOSIS — M1A9XX1 Chronic gout, unspecified, with tophus (tophi): Secondary | ICD-10-CM

## 2021-03-22 MED ORDER — ONDANSETRON 4 MG PO TBDP: 4.0000 mg | ORAL_TABLET | Freq: Three times a day (TID) | ORAL | 0 refills | Status: DC | PRN

## 2021-03-22 MED ORDER — HYDROCODONE-ACETAMINOPHEN 5-325 MG PO TABS: 1.0000 | ORAL_TABLET | Freq: Four times a day (QID) | ORAL | 0 refills | Status: AC | PRN

## 2021-03-22 NOTE — Progress Notes (Signed)
Patient ID: Tim Vincent, male    DOB: 02-May-1955, 66 y.o.   MRN: [id number]  Chief Complaint  Patient presents with   Pre-op Exam      ICD-10-CM   1. Gout with tophus  M1A.9XX1        History of Present Illness: Tim Vincent is a 66 y.o.  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  He presents for preoperative evaluation for upcoming procedure, left elbow mass excision, scheduled for 04/17/2021 with Dr. Arita Miss.  The patient has never had anesthesia.  His blood pressure today is 194/86.  He states that that has been his baseline for 15 years, has not dipped below 160 systolic since then.  He does endorse relatively frequent epistaxis.  He thought that perhaps he had a bleeding disorder, but I suspected to be related to his uncontrolled blood pressures.  He denies any chest pain, shortness of breath, vision changes, headaches, back pain, or other symptoms.  He states that the masses on his elbows arose in 2005 at which point he also had considerable swelling and pain in his knees bilaterally.  He states that at 1 point he could not ambulate.  His knee swelling and discomfort subsided, but he has had persistent masses in his elbows ever since.  He is looking forward to having them removed.  While he was prescribed metformin for A1c of 7.1 06/2020, he states that he has not been taking it recently.  He agrees to take it between now and a few weeks after surgery.  His daughter who is at bedside and is functioning as the interpreter for the exam.  She states that he simply does not like going to the doctor.  No known personal or family history of blood clots or clotting disorder.  He has never had cancer.  He is not on any hormone replacement.  Reports the flu a little over a month ago from which he has completely recovered.  No recent fevers or infection.  No recent traumas.  Patient states that he will occasionally have edema in his lower legs after driving for  extended periods of time, suspect dependent edema.  Summary of Previous Visit: Patient was seen for consult 01/11/2021.  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  Patient expressed that he would prefer to have 1 removed at a time, starting with the left side.  Discussed proximity of ulnar nerve to the excision site and potential for wound healing complications postoperatively.  Patient expressed desire to proceed with surgical intervention.  Job: [employer] doctor.  PMH Significant for: Hypertension, type II DM on metformin with most recent A1c 7.1 06/2020.   Past Medical History: Allergies: No Known Allergies  Current Medications:  Current Outpatient Medications:    lisinopril (PRINIVIL,ZESTRIL) 10 MG tablet, Take 1 tablet (10 mg total) by mouth daily., Disp: 90 tablet, Rfl: 3  Past Medical Problems: No past medical history on file.  Past Surgical History: No past surgical history on file.  Social History: Social History   Socioeconomic History   Marital status: Married    Spouse name: Not on file   Number of children: Not on file   Years of education: Not on file   Highest education level: Not on file  Occupational History   Not on file  Tobacco Use   Smoking status: Never   Smokeless tobacco: Never  Substance  and Sexual Activity   Alcohol use: Not on file   Drug use: Not on file   Sexual activity: Not on file  Other Topics Concern   Not on file  Social History Narrative   Not on file   Social Determinants of Health   Financial Resource Strain: Not on file  Food Insecurity: Not on file  Transportation Needs: Not on file  Physical Activity: Not on file  Stress: Not on file  Social Connections: Not on file  Intimate Partner Violence: Not on file    Family History: No family history on file.  Review of Systems: ROS Denies recent injuries, trauma, fevers, chills, or infection.  Physical  Exam: Vital Signs BP (!) 194/86 (BP Location: Left Arm, Patient Position: Sitting, Cuff Size: Small)    Pulse 70    Ht 5\' 7"  (1.702 m)    Wt 185 lb 9.6 oz (84.2 kg)    SpO2 98%    BMI 29.07 kg/m   Physical Exam Constitutional:      General: Not in acute distress.    Appearance: Normal appearance. Not ill-appearing.  HENT:     Head: Normocephalic and atraumatic.  Eyes:     Pupils: Pupils are equal, round. Cardiovascular:     Rate and Rhythm: Normal rate.    Pulses: Normal pulses.  Pulmonary:     Effort: No respiratory distress or increased work of breathing.  Speaks in full sentences. Abdominal:     General: Abdomen is flat. No distension.   Musculoskeletal: Normal range of motion. No lower extremity swelling or edema appreciated today. No varicosities. Skin:    General: Skin is warm and dry.     Findings: No erythema or rash.  Neurological:     Mental Status: Alert and oriented to person, place, and time.  Psychiatric:        Mood and Affect: Mood normal.        Behavior: Behavior normal.    Assessment/Plan: The patient is scheduled for 04/17/2021 with Dr. 06/15/2021.  Risks, benefits, and alternatives of procedure discussed, questions answered and consent obtained.    Smoking Status: Non-smoker.  Caprini Score: 5; Risk Factors include: Age, BMI greater than 25, and length of planned surgery. Recommendation for mechanical prophylaxis. Encourage early ambulation.   Pictures obtained: Today.  Post-op Rx sent to pharmacy: Norco, Zofran.  Explained that he cannot drive while taking Norco.  Patient was provided with the General Surgical Risk consent document and Pain Medication Agreement prior to their appointment.  They had adequate time to read through the risk consent documents and Pain Medication Agreement. We also discussed them in person together during this preop appointment. All of their questions were answered to their satisfaction.  Recommended calling if they have any further  questions.  Risk consent form and Pain Medication Agreement to be scanned into patient's chart.    Electronically signed by: Arita Miss, PA-C 03/22/2021 1:39 PM

## 2021-04-04 ENCOUNTER — Encounter: Payer: Medicare Other | Admitting: Plastic Surgery

## 2021-04-10 ENCOUNTER — Encounter (HOSPITAL_BASED_OUTPATIENT_CLINIC_OR_DEPARTMENT_OTHER): Payer: Self-pay | Admitting: Plastic Surgery

## 2021-04-10 ENCOUNTER — Other Ambulatory Visit: Payer: Self-pay

## 2021-04-16 NOTE — Progress Notes (Signed)
Sent text reminding pt to come in for lab work today.  °

## 2021-04-17 ENCOUNTER — Encounter (HOSPITAL_BASED_OUTPATIENT_CLINIC_OR_DEPARTMENT_OTHER): Payer: Self-pay | Admitting: Plastic Surgery

## 2021-04-17 ENCOUNTER — Encounter (HOSPITAL_BASED_OUTPATIENT_CLINIC_OR_DEPARTMENT_OTHER)
Admission: RE | Disposition: A | Discharge status: Home / Self Care | Payer: Self-pay | Source: Home / Self Care | Attending: Plastic Surgery

## 2021-04-17 ENCOUNTER — Ambulatory Visit (HOSPITAL_BASED_OUTPATIENT_CLINIC_OR_DEPARTMENT_OTHER)
Admission: RE | Admit: 2021-04-17 | Discharge: 2021-04-17 | Disposition: A | Discharge status: Home / Self Care | Payer: Medicare Other | Attending: Plastic Surgery | Admitting: Plastic Surgery

## 2021-04-17 ENCOUNTER — Ambulatory Visit (HOSPITAL_BASED_OUTPATIENT_CLINIC_OR_DEPARTMENT_OTHER): Payer: Medicare Other | Admitting: Anesthesiology

## 2021-04-17 ENCOUNTER — Other Ambulatory Visit: Payer: Self-pay

## 2021-04-17 DIAGNOSIS — Z539 Procedure and treatment not carried out, unspecified reason: Secondary | ICD-10-CM | POA: Insufficient documentation

## 2021-04-17 DIAGNOSIS — I10 Essential (primary) hypertension: Secondary | ICD-10-CM | POA: Diagnosis not present

## 2021-04-17 DIAGNOSIS — Z7984 Long term (current) use of oral hypoglycemic drugs: Secondary | ICD-10-CM | POA: Diagnosis not present

## 2021-04-17 DIAGNOSIS — E119 Type 2 diabetes mellitus without complications: Secondary | ICD-10-CM | POA: Diagnosis not present

## 2021-04-17 HISTORY — PX: MASS EXCISION: SHX2000

## 2021-04-17 HISTORY — DX: Type 2 diabetes mellitus without complications: E11.9

## 2021-04-17 HISTORY — DX: Essential (primary) hypertension: I10

## 2021-04-17 LAB — BASIC METABOLIC PANEL
Anion gap: 9 (ref 5–15)
BUN: 32 mg/dL — ABNORMAL HIGH (ref 8–23)
CO2: 24 mmol/L (ref 22–32)
Calcium: 8.3 mg/dL — ABNORMAL LOW (ref 8.9–10.3)
Chloride: 110 mmol/L (ref 98–111)
Creatinine, Ser: 2.13 mg/dL — ABNORMAL HIGH (ref 0.61–1.24)
GFR, Estimated: 34 mL/min — ABNORMAL LOW (ref >60)
Glucose, Bld: 134 mg/dL — ABNORMAL HIGH (ref 70–99)
Potassium: 4.2 mmol/L (ref 3.5–5.1)
Sodium: 143 mmol/L (ref 135–145)

## 2021-04-17 LAB — GLUCOSE, CAPILLARY: Glucose-Capillary: 119 mg/dL — ABNORMAL HIGH (ref 70–99)

## 2021-04-17 SURGERY — EXCISION MASS
Anesthesia: General | Site: Elbow | Laterality: Left

## 2021-04-17 MED ORDER — FENTANYL CITRATE (PF) 100 MCG/2ML IJ SOLN
INTRAMUSCULAR | Status: AC
  Filled 2021-04-17: qty 2

## 2021-04-17 MED ORDER — CHLORHEXIDINE GLUCONATE CLOTH 2 % EX PADS: 6.0000 | MEDICATED_PAD | Freq: Once | CUTANEOUS | Status: DC

## 2021-04-17 MED ORDER — CEFAZOLIN SODIUM-DEXTROSE 2-4 GM/100ML-% IV SOLN
INTRAVENOUS | Status: AC
  Filled 2021-04-17: qty 100

## 2021-04-17 MED ORDER — MIDAZOLAM HCL 2 MG/2ML IJ SOLN
INTRAMUSCULAR | Status: AC
  Filled 2021-04-17: qty 2

## 2021-04-17 MED ORDER — ACETAMINOPHEN 500 MG PO TABS
1000.0000 mg | ORAL_TABLET | Freq: Once | ORAL | Status: AC
  Administered 2021-04-17: 1000 mg via ORAL

## 2021-04-17 MED ORDER — CEFAZOLIN SODIUM-DEXTROSE 2-4 GM/100ML-% IV SOLN: 2.0000 g | INTRAVENOUS | Status: DC

## 2021-04-17 MED ORDER — ROCURONIUM BROMIDE 10 MG/ML (PF) SYRINGE
PREFILLED_SYRINGE | INTRAVENOUS | Status: AC
  Filled 2021-04-17: qty 10

## 2021-04-17 MED ORDER — ACETAMINOPHEN 500 MG PO TABS
ORAL_TABLET | ORAL | Status: AC
  Filled 2021-04-17: qty 2

## 2021-04-17 MED ORDER — LACTATED RINGERS IV SOLN: INTRAVENOUS | Status: DC

## 2021-04-17 SURGICAL SUPPLY — 53 items
BAG DECANTER FOR FLEXI CONT (MISCELLANEOUS) IMPLANT
BENZOIN TINCTURE PRP APPL 2/3 (GAUZE/BANDAGES/DRESSINGS) IMPLANT
BLADE SURG 15 STRL LF DISP TIS (BLADE) ×1 IMPLANT
BLADE SURG 15 STRL SS (BLADE) ×2
BNDG COHESIVE 1X5 TAN STRL LF (GAUZE/BANDAGES/DRESSINGS) IMPLANT
BNDG COHESIVE 3X5 TAN ST LF (GAUZE/BANDAGES/DRESSINGS) IMPLANT
BNDG ELASTIC 3X5.8 VLCR STR LF (GAUZE/BANDAGES/DRESSINGS) IMPLANT
BNDG ELASTIC 4X5.8 VLCR STR LF (GAUZE/BANDAGES/DRESSINGS) IMPLANT
BNDG ESMARK 4X9 LF (GAUZE/BANDAGES/DRESSINGS) IMPLANT
BNDG GAUZE ELAST 4 BULKY (GAUZE/BANDAGES/DRESSINGS) ×2 IMPLANT
CHLORAPREP W/TINT 26 (MISCELLANEOUS) IMPLANT
CORD BIPOLAR FORCEPS 12FT (ELECTRODE) IMPLANT
COVER BACK TABLE 60X90IN (DRAPES) ×2 IMPLANT
COVER MAYO STAND STRL (DRAPES) ×2 IMPLANT
CUFF TOURN SGL QUICK 18X4 (TOURNIQUET CUFF) IMPLANT
DRAPE EXTREMITY T 121X128X90 (DISPOSABLE) ×2 IMPLANT
DRAPE SURG 17X23 STRL (DRAPES) ×2 IMPLANT
ELECT REM PT RETURN 9FT ADLT (ELECTROSURGICAL)
ELECTRODE REM PT RTRN 9FT ADLT (ELECTROSURGICAL) IMPLANT
GAUZE PACKING IODOFORM 1/4X15 (PACKING) IMPLANT
GAUZE SPONGE 4X4 12PLY STRL (GAUZE/BANDAGES/DRESSINGS) ×2 IMPLANT
GAUZE XEROFORM 1X8 LF (GAUZE/BANDAGES/DRESSINGS) IMPLANT
GLOVE SRG 8 PF TXTR STRL LF DI (GLOVE) ×1 IMPLANT
GLOVE SURG ENC MOIS LTX SZ7.5 (GLOVE) IMPLANT
GLOVE SURG ENC TEXT LTX SZ7.5 (GLOVE) ×2 IMPLANT
GLOVE SURG UNDER POLY LF SZ8 (GLOVE) ×2
GOWN STRL REUS W/ TWL LRG LVL3 (GOWN DISPOSABLE) ×1 IMPLANT
GOWN STRL REUS W/TWL LRG LVL3 (GOWN DISPOSABLE) ×2
GOWN STRL REUS W/TWL XL LVL3 (GOWN DISPOSABLE) ×2 IMPLANT
NDL HYPO 27GX1-1/4 (NEEDLE) IMPLANT
NEEDLE HYPO 27GX1-1/4 (NEEDLE) IMPLANT
NS IRRIG 1000ML POUR BTL (IV SOLUTION) ×2 IMPLANT
PACK BASIN DAY SURGERY FS (CUSTOM PROCEDURE TRAY) ×2 IMPLANT
PAD CAST 3X4 CTTN HI CHSV (CAST SUPPLIES) IMPLANT
PADDING CAST ABS 4INX4YD NS (CAST SUPPLIES) ×1
PADDING CAST ABS COTTON 4X4 ST (CAST SUPPLIES) ×1 IMPLANT
PADDING CAST COTTON 3X4 STRL (CAST SUPPLIES)
PENCIL SMOKE EVACUATOR (MISCELLANEOUS) IMPLANT
SHEET MEDIUM DRAPE 40X70 STRL (DRAPES) IMPLANT
SPLINT PLASTER CAST XFAST 3X15 (CAST SUPPLIES) IMPLANT
SPLINT PLASTER XTRA FASTSET 3X (CAST SUPPLIES)
STOCKINETTE 4X48 STRL (DRAPES) ×2 IMPLANT
STOCKINETTE 6  STRL (DRAPES)
STOCKINETTE 6 STRL (DRAPES) IMPLANT
SUT ETHILON 4 0 PS 2 18 (SUTURE) ×2 IMPLANT
SUT MNCRL AB 4-0 PS2 18 (SUTURE) IMPLANT
SWAB COLLECTION DEVICE MRSA (MISCELLANEOUS) IMPLANT
SWAB CULTURE ESWAB REG 1ML (MISCELLANEOUS) IMPLANT
SYR BULB EAR ULCER 3OZ GRN STR (SYRINGE) ×2 IMPLANT
SYR CONTROL 10ML LL (SYRINGE) IMPLANT
TOWEL GREEN STERILE FF (TOWEL DISPOSABLE) ×4 IMPLANT
TRAY DSU PREP LF (CUSTOM PROCEDURE TRAY) IMPLANT
UNDERPAD 30X36 HEAVY ABSORB (UNDERPADS AND DIAPERS) ×2 IMPLANT

## 2021-04-17 NOTE — H&P (Signed)
Case canceled 

## 2021-04-17 NOTE — Anesthesia Preprocedure Evaluation (Addendum)
Anesthesia Evaluation    Reviewed: Allergy & Precautions, Patient's Chart, lab work & pertinent test results  Airway        Dental   Pulmonary neg pulmonary ROS,           Cardiovascular hypertension, Pt. on medications      Neuro/Psych negative neurological ROS  negative psych ROS   GI/Hepatic negative GI ROS, (+)       alcohol use,   Endo/Other  diabetes, Type 2, Oral Hypoglycemic Agents  Renal/GU negative Renal ROS  negative genitourinary   Musculoskeletal negative musculoskeletal ROS (+)   Abdominal   Peds  Hematology negative hematology ROS (+)   Anesthesia Other Findings   Reproductive/Obstetrics negative OB ROS                           Anesthesia Physical Anesthesia Plan  ASA: 2  Anesthesia Plan: General   Post-op Pain Management: Tylenol PO (pre-op) and Toradol IV (intra-op)   Induction: Intravenous  PONV Risk Score and Plan: 2 and Ondansetron, Dexamethasone, Midazolam and Treatment may vary due to age or medical condition  Airway Management Planned: Oral ETT  Additional Equipment: None  Intra-op Plan:   Post-operative Plan: Extubation in OR  Informed Consent:   Plan Discussed with:   Anesthesia Plan Comments: (BP 210-220SBP consistently on both arms, multiple readings- d/w pt he needs to see PCP and likely needs to be put on another antihypertensive in addition to what he is currently taking. Per pt, he is normally 180-190SBP. D/w pt risk of MI and stroke with consistently high blood pressure. )       Anesthesia Quick Evaluation

## 2021-04-17 NOTE — Progress Notes (Signed)
Patient's surgery cancelled by Dr. Salvadore Farber due to elevated blood pressures ranging from 210/86 and 221/85. Patient educated and advised to follow up with primary care.

## 2021-04-18 ENCOUNTER — Encounter (HOSPITAL_BASED_OUTPATIENT_CLINIC_OR_DEPARTMENT_OTHER): Payer: Self-pay | Admitting: Plastic Surgery

## 2021-04-18 DIAGNOSIS — E1122 Type 2 diabetes mellitus with diabetic chronic kidney disease: Secondary | ICD-10-CM | POA: Diagnosis not present

## 2021-04-18 DIAGNOSIS — I10 Essential (primary) hypertension: Secondary | ICD-10-CM | POA: Diagnosis not present

## 2021-04-18 DIAGNOSIS — E1142 Type 2 diabetes mellitus with diabetic polyneuropathy: Secondary | ICD-10-CM | POA: Diagnosis not present

## 2021-04-18 DIAGNOSIS — N1832 Chronic kidney disease, stage 3b: Secondary | ICD-10-CM | POA: Diagnosis not present

## 2021-04-20 NOTE — H&P (Signed)
Case Cancelled

## 2021-04-25 ENCOUNTER — Encounter: Payer: Medicaid Other | Admitting: Plastic Surgery

## 2021-06-01 DIAGNOSIS — I10 Essential (primary) hypertension: Secondary | ICD-10-CM | POA: Diagnosis not present

## 2021-06-01 DIAGNOSIS — N1832 Chronic kidney disease, stage 3b: Secondary | ICD-10-CM | POA: Diagnosis not present

## 2021-06-01 DIAGNOSIS — E1122 Type 2 diabetes mellitus with diabetic chronic kidney disease: Secondary | ICD-10-CM | POA: Diagnosis not present

## 2021-06-01 DIAGNOSIS — Z0000 Encounter for general adult medical examination without abnormal findings: Secondary | ICD-10-CM | POA: Diagnosis not present

## 2021-06-01 DIAGNOSIS — E1142 Type 2 diabetes mellitus with diabetic polyneuropathy: Secondary | ICD-10-CM | POA: Diagnosis not present

## 2021-06-21 ENCOUNTER — Telehealth: Payer: Self-pay | Admitting: Plastic Surgery

## 2021-06-21 NOTE — Telephone Encounter (Signed)
Pt daughter is calling in wanting to go ahead and get the pt scheduled for surgery as soon as possible due to the pts elbows are red and has gotten larger.  She would like to have a call back. ?

## 2021-07-12 ENCOUNTER — Ambulatory Visit (INDEPENDENT_AMBULATORY_CARE_PROVIDER_SITE_OTHER): Payer: Medicare Other | Admitting: Plastic Surgery

## 2021-07-12 VITALS — BP 161/85

## 2021-07-12 DIAGNOSIS — E782 Mixed hyperlipidemia: Secondary | ICD-10-CM | POA: Diagnosis not present

## 2021-07-12 DIAGNOSIS — Z0000 Encounter for general adult medical examination without abnormal findings: Secondary | ICD-10-CM | POA: Diagnosis not present

## 2021-07-12 DIAGNOSIS — M1A9XX1 Chronic gout, unspecified, with tophus (tophi): Secondary | ICD-10-CM | POA: Diagnosis not present

## 2021-07-12 DIAGNOSIS — E1122 Type 2 diabetes mellitus with diabetic chronic kidney disease: Secondary | ICD-10-CM | POA: Diagnosis not present

## 2021-07-12 DIAGNOSIS — E1142 Type 2 diabetes mellitus with diabetic polyneuropathy: Secondary | ICD-10-CM | POA: Diagnosis not present

## 2021-07-12 DIAGNOSIS — I10 Essential (primary) hypertension: Secondary | ICD-10-CM | POA: Diagnosis not present

## 2021-07-12 DIAGNOSIS — N1832 Chronic kidney disease, stage 3b: Secondary | ICD-10-CM | POA: Diagnosis not present

## 2021-07-12 NOTE — Progress Notes (Signed)
? ?  Referring Provider ?Norm Salt, PA ?[phone] ?[place],  [place] [address]  ? ?CC:  ?Chief Complaint  ?Patient presents with  ? Follow-up  ?   ? ?Tim Vincent is an 66 y.o. male.  ?HPI: Patient presents in follow-up for bilateral elbow masses.  We will plan to excise the left 1 first but surgery was canceled as his hypertension was uncontrolled.  He since restarted his medications and has had much improved blood pressure control recently. ? ?Review of Systems ?General: Denies fevers and chills ? ?Physical Exam ? ?  07/12/2021  ? 11:38 AM 04/17/2021  ?  9:24 AM 04/10/2021  ?  2:44 PM  ?Vitals with BMI  ?Height  5\' 7"  5\' 7"   ?Weight  191 lbs 2 oz 185 lbs  ?BMI  29.93 28.97  ?Systolic 161 210   ?Diastolic 85 86   ?Pulse  56   ?  ?General:  No acute distress,  Alert and oriented, Non-Toxic, Normal speech and affect ?Examination of the left elbow shows a large gouty tophus extending from the proximal forearm to the distal upper arm.  No changes. ? ?Assessment/Plan ?Now the blood pressure is improved I think it would be safe to go ahead with the surgery.  I did explain that his blood pressure is still higher than would be ideal in the long run but hopefully it would be safe to proceed with surgery.  We will plan to move ahead with removal of the left elbow mass as previously planned. ? ? ?07/12/2021, 3:46 PM  ? ? ?    ?

## 2021-07-31 ENCOUNTER — Encounter (HOSPITAL_BASED_OUTPATIENT_CLINIC_OR_DEPARTMENT_OTHER): Payer: Self-pay | Admitting: Plastic Surgery

## 2021-07-31 ENCOUNTER — Other Ambulatory Visit: Payer: Self-pay

## 2021-08-02 ENCOUNTER — Encounter: Payer: Medicare Other | Admitting: Surgical

## 2021-08-02 ENCOUNTER — Ambulatory Visit (INDEPENDENT_AMBULATORY_CARE_PROVIDER_SITE_OTHER): Payer: Medicare Other | Admitting: Surgical

## 2021-08-02 ENCOUNTER — Encounter: Payer: Self-pay | Admitting: Surgical

## 2021-08-02 VITALS — BP 192/90 | HR 73 | Ht 67.0 in | Wt 192.2 lb

## 2021-08-02 DIAGNOSIS — M1A9XX1 Chronic gout, unspecified, with tophus (tophi): Secondary | ICD-10-CM

## 2021-08-02 NOTE — H&P (View-Only) (Signed)
Patient ID: Tim Vincent, male    DOB: 08/19/1955, 66 y.o.   MRN: [id number]  Chief Complaint  Patient presents with   Pre-op Exam      ICD-10-CM   1. Gout with tophus  M1A.9XX1       History of Present Illness: Tim Vincent is a 66 y.o.  male  with a history of bilateral elbow masses.  He presents for preoperative evaluation for upcoming procedure, excision of left elbow mass, scheduled for 08/10/2021 with Dr. Claudia Desanctis.  Of note, patient was previously scheduled for excision in February 2023, however surgery was canceled due to patient's blood pressure being uncontrolled.  He was last seen by Dr. Claudia Desanctis earlier this month and his blood pressures have seemed to be under better control.  They discussed proceeding with surgery.  Patient presents today with Spanish interpreter and his son-in-law.  Patient reports he is doing well, he is hopeful to have surgery.  He reports that the left elbow mass has not changed much since his last appointment.  He reports that he has been taking his blood pressure medications as prescribed, reports he takes his blood pressure daily at home and it is in the 140/90 range.  Patient reports no history of anesthesia.  No known family complications of anesthesia.  PMH Significant for: Hypertension, type 2 diabetes mellitus -reports daily blood sugars in the 120-140 range.  Past Medical History: Allergies: No Known Allergies  Current Medications:  Current Outpatient Medications:    lisinopril (PRINIVIL,ZESTRIL) 10 MG tablet, Take 1 tablet (10 mg total) by mouth daily., Disp: 90 tablet, Rfl: 3   METFORMIN HCL PO, Take by mouth daily., Disp: , Rfl:    ondansetron (ZOFRAN-ODT) 4 MG disintegrating tablet, Take 1 tablet (4 mg total) by mouth every 8 (eight) hours as needed for nausea or vomiting., Disp: 20 tablet, Rfl: 0  Past Medical Problems: Past Medical History:  Diagnosis Date   Diabetes mellitus without complication ([place])    Hypertension     Past  Surgical History: Past Surgical History:  Procedure Laterality Date   MASS EXCISION Left 04/17/2021   Procedure: EXCISION LEFT ELBOW MASS;  Surgeon: Cindra Presume, MD;  Location: [facility];  Service: Plastics;  Laterality: Left;  1 hour    Social History: Social History   Socioeconomic History   Marital status: Married    Spouse name: Not on file   Number of children: Not on file   Years of education: Not on file   Highest education level: Not on file  Occupational History   Not on file  Tobacco Use   Smoking status: Never   Smokeless tobacco: Never  Vaping Use   Vaping Use: Never used  Substance and Sexual Activity   Alcohol use: Yes    Alcohol/week: 6.0 standard drinks    Types: 6 Cans of beer per week   Drug use: Never   Sexual activity: Not on file  Other Topics Concern   Not on file  Social History Narrative   Not on file   Social Determinants of Health   Financial Resource Strain: Not on file  Food Insecurity: Not on file  Transportation Needs: Not on file  Physical Activity: Not on file  Stress: Not on file  Social Connections: Not on file  Intimate Partner Violence: Not on file    Family History: History reviewed. No pertinent family history.  Review of Systems: Review of Systems  Constitutional: Negative.  Respiratory:  Negative for shortness of breath.   Cardiovascular:  Negative for chest pain, palpitations and leg swelling.  Gastrointestinal: Negative.   Neurological: Negative.    Physical Exam: Vital Signs BP (!) 192/90 (BP Location: Left Arm, Patient Position: Sitting, Cuff Size: Small)   Pulse 73   Ht 5\' 7"  (1.702 m)   Wt 192 lb 3.2 oz (87.2 kg)   SpO2 100%   BMI 30.10 kg/m   Physical Exam  Constitutional:      General: Not in acute distress.    Appearance: Normal appearance. Not ill-appearing.  HENT:     Head: Normocephalic and atraumatic.  Eyes:     Pupils: Pupils are equal, round Neck:     Musculoskeletal:  Normal range of motion.  Cardiovascular:     Rate and Rhythm: Normal rate    Pulses: Normal pulses.  Pulmonary:     Effort: Pulmonary effort is normal. No respiratory distress.  Abdominal:     General: Abdomen is flat. There is no distension.  Musculoskeletal: Normal range of motion.  Skin:    General: Skin is warm and dry.     Findings: No erythema or rash.  Neurological:     General: No focal deficit present.     Mental Status: Alert and oriented to person, place, and time. Mental status is at baseline.     Motor: No weakness.  Psychiatric:        Mood and Affect: Mood normal.        Behavior: Behavior normal.    Assessment/Plan: The patient is scheduled for excision of left elbow mass with Dr. Claudia Desanctis.  Risks, benefits, and alternatives of procedure discussed, questions answered and consent obtained.    Smoking Status: Non-smoker; Counseling Given?  N/A  Caprini Score: 5; Risk Factors include: Age, BMI greater than 25, and length of planned surgery. Recommendation for mechanical prophylaxis. Encourage early ambulation.   Pictures obtained: 03/22/2021  Post-op Rx sent to pharmacy:  Patient previously picked up prescription for Norco at his previous preop earlier this year, no additional narcotics will be sent.  Patient was provided with the General Surgical Risk consent document and Pain Medication Agreement prior to their appointment.  They had adequate time to read through the risk consent documents and Pain Medication Agreement. We also discussed them in person together during this preop appointment. All of their questions were answered to their satisfaction.  Recommended calling if they have any further questions.  Risk consent form and Pain Medication Agreement to be scanned into patient's chart.  Patient had no specific questions about scheduled procedure.  He reports he previously picked up prescriptions for pain medications.  All of his questions were answered to his content  with the use of an interpreter.  Patient is aware that surgery could possibly be canceled again if his blood pressure is at an elevated uncontrolled level at the discretion of anesthesia.   Electronically signed by: Carola Rhine Jakala Herford, PA-C 08/02/2021 2:58 PM

## 2021-08-02 NOTE — Progress Notes (Signed)
   Patient ID: Tim Vincent, male    DOB: 10/05/1955, 66 y.o.   MRN: [id number]  Chief Complaint  Patient presents with   Pre-op Exam      ICD-10-CM   1. Gout with tophus  [id number].[id number]       History of Present Illness: Tim Vincent is a 66 y.o.  male  with a history of bilateral elbow masses.  He presents for preoperative evaluation for upcoming procedure, excision of left elbow mass, scheduled for 08/10/2021 with Dr. Pace.  Of note, patient was previously scheduled for excision in February 2023, however surgery was canceled due to patient's blood pressure being uncontrolled.  He was last seen by Dr. Pace earlier this month and his blood pressures have seemed to be under better control.  They discussed proceeding with surgery.  Patient presents today with Spanish interpreter and his son-in-law.  Patient reports he is doing well, he is hopeful to have surgery.  He reports that the left elbow mass has not changed much since his last appointment.  He reports that he has been taking his blood pressure medications as prescribed, reports he takes his blood pressure daily at home and it is in the 140/90 range.  Patient reports no history of anesthesia.  No known family complications of anesthesia.  PMH Significant for: Hypertension, type 2 diabetes mellitus -reports daily blood sugars in the 120-140 range.  Past Medical History: Allergies: No Known Allergies  Current Medications:  Current Outpatient Medications:    lisinopril (PRINIVIL,ZESTRIL) 10 MG tablet, Take 1 tablet (10 mg total) by mouth daily., Disp: 90 tablet, Rfl: 3   METFORMIN HCL PO, Take by mouth daily., Disp: , Rfl:    ondansetron (ZOFRAN-ODT) 4 MG disintegrating tablet, Take 1 tablet (4 mg total) by mouth every 8 (eight) hours as needed for nausea or vomiting., Disp: 20 tablet, Rfl: 0  Past Medical Problems: Past Medical History:  Diagnosis Date   Diabetes mellitus without complication (HCC)    Hypertension     Past  Surgical History: Past Surgical History:  Procedure Laterality Date   MASS EXCISION Left 04/17/2021   Procedure: EXCISION LEFT ELBOW MASS;  Surgeon: Pace, Collier S, MD;  Location: [facility];  Service: Plastics;  Laterality: Left;  1 hour    Social History: Social History   Socioeconomic History   Marital status: Married    Spouse name: Not on file   Number of children: Not on file   Years of education: Not on file   Highest education level: Not on file  Occupational History   Not on file  Tobacco Use   Smoking status: Never   Smokeless tobacco: Never  Vaping Use   Vaping Use: Never used  Substance and Sexual Activity   Alcohol use: Yes    Alcohol/week: 6.0 standard drinks    Types: 6 Cans of beer per week   Drug use: Never   Sexual activity: Not on file  Other Topics Concern   Not on file  Social History Narrative   Not on file   Social Determinants of Health   Financial Resource Strain: Not on file  Food Insecurity: Not on file  Transportation Needs: Not on file  Physical Activity: Not on file  Stress: Not on file  Social Connections: Not on file  Intimate Partner Violence: Not on file    Family History: History reviewed. No pertinent family history.  Review of Systems: Review of Systems  Constitutional: Negative.     Respiratory:  Negative for shortness of breath.   Cardiovascular:  Negative for chest pain, palpitations and leg swelling.  Gastrointestinal: Negative.   Neurological: Negative.    Physical Exam: Vital Signs BP (!) 192/90 (BP Location: Left Arm, Patient Position: Sitting, Cuff Size: Small)   Pulse 73   Ht 5' 7" (1.702 m)   Wt 192 lb 3.2 oz (87.2 kg)   SpO2 100%   BMI 30.10 kg/m   Physical Exam  Constitutional:      General: Not in acute distress.    Appearance: Normal appearance. Not ill-appearing.  HENT:     Head: Normocephalic and atraumatic.  Eyes:     Pupils: Pupils are equal, round Neck:     Musculoskeletal:  Normal range of motion.  Cardiovascular:     Rate and Rhythm: Normal rate    Pulses: Normal pulses.  Pulmonary:     Effort: Pulmonary effort is normal. No respiratory distress.  Abdominal:     General: Abdomen is flat. There is no distension.  Musculoskeletal: Normal range of motion.  Skin:    General: Skin is warm and dry.     Findings: No erythema or rash.  Neurological:     General: No focal deficit present.     Mental Status: Alert and oriented to person, place, and time. Mental status is at baseline.     Motor: No weakness.  Psychiatric:        Mood and Affect: Mood normal.        Behavior: Behavior normal.    Assessment/Plan: The patient is scheduled for excision of left elbow mass with Dr. Pace.  Risks, benefits, and alternatives of procedure discussed, questions answered and consent obtained.    Smoking Status: Non-smoker; Counseling Given?  N/A  Caprini Score: 5; Risk Factors include: Age, BMI greater than 25, and length of planned surgery. Recommendation for mechanical prophylaxis. Encourage early ambulation.   Pictures obtained: 03/22/2021  Post-op Rx sent to pharmacy:  Patient previously picked up prescription for Norco at his previous preop earlier this year, no additional narcotics will be sent.  Patient was provided with the General Surgical Risk consent document and Pain Medication Agreement prior to their appointment.  They had adequate time to read through the risk consent documents and Pain Medication Agreement. We also discussed them in person together during this preop appointment. All of their questions were answered to their satisfaction.  Recommended calling if they have any further questions.  Risk consent form and Pain Medication Agreement to be scanned into patient's chart.  Patient had no specific questions about scheduled procedure.  He reports he previously picked up prescriptions for pain medications.  All of his questions were answered to his content  with the use of an interpreter.  Patient is aware that surgery could possibly be canceled again if his blood pressure is at an elevated uncontrolled level at the discretion of anesthesia.   Electronically signed by: Avonelle Viveros J Atwell Mcdanel, PA-C 08/02/2021 2:58 PM 

## 2021-08-09 NOTE — Progress Notes (Signed)
Sent text reminding pt to come in for lab work and soap.

## 2021-08-10 ENCOUNTER — Other Ambulatory Visit: Payer: Self-pay

## 2021-08-10 ENCOUNTER — Ambulatory Visit (HOSPITAL_BASED_OUTPATIENT_CLINIC_OR_DEPARTMENT_OTHER): Payer: Medicare Other | Admitting: Anesthesiology

## 2021-08-10 ENCOUNTER — Encounter (HOSPITAL_BASED_OUTPATIENT_CLINIC_OR_DEPARTMENT_OTHER)
Admission: RE | Disposition: A | Discharge status: Home / Self Care | Payer: Self-pay | Source: Home / Self Care | Attending: Plastic Surgery

## 2021-08-10 ENCOUNTER — Encounter (HOSPITAL_BASED_OUTPATIENT_CLINIC_OR_DEPARTMENT_OTHER): Payer: Self-pay | Admitting: Plastic Surgery

## 2021-08-10 ENCOUNTER — Ambulatory Visit (HOSPITAL_BASED_OUTPATIENT_CLINIC_OR_DEPARTMENT_OTHER)
Admission: RE | Admit: 2021-08-10 | Discharge: 2021-08-10 | Disposition: A | Discharge status: Home / Self Care | Payer: Medicare Other | Attending: Plastic Surgery | Admitting: Plastic Surgery

## 2021-08-10 DIAGNOSIS — M109 Gout, unspecified: Secondary | ICD-10-CM | POA: Diagnosis not present

## 2021-08-10 DIAGNOSIS — Z01818 Encounter for other preprocedural examination: Secondary | ICD-10-CM

## 2021-08-10 DIAGNOSIS — M1A9XX1 Chronic gout, unspecified, with tophus (tophi): Secondary | ICD-10-CM | POA: Insufficient documentation

## 2021-08-10 DIAGNOSIS — E119 Type 2 diabetes mellitus without complications: Secondary | ICD-10-CM | POA: Insufficient documentation

## 2021-08-10 DIAGNOSIS — Z7984 Long term (current) use of oral hypoglycemic drugs: Secondary | ICD-10-CM | POA: Insufficient documentation

## 2021-08-10 DIAGNOSIS — I10 Essential (primary) hypertension: Secondary | ICD-10-CM | POA: Insufficient documentation

## 2021-08-10 HISTORY — PX: MASS EXCISION: SHX2000

## 2021-08-10 LAB — BASIC METABOLIC PANEL
Anion gap: 11 (ref 5–15)
BUN: 45 mg/dL — ABNORMAL HIGH (ref 8–23)
CO2: 22 mmol/L (ref 22–32)
Calcium: 8.8 mg/dL — ABNORMAL LOW (ref 8.9–10.3)
Chloride: 111 mmol/L (ref 98–111)
Creatinine, Ser: 2.83 mg/dL — ABNORMAL HIGH (ref 0.61–1.24)
GFR, Estimated: 24 mL/min — ABNORMAL LOW (ref >60)
Glucose, Bld: 124 mg/dL — ABNORMAL HIGH (ref 70–99)
Potassium: 3.7 mmol/L (ref 3.5–5.1)
Sodium: 144 mmol/L (ref 135–145)

## 2021-08-10 LAB — GLUCOSE, CAPILLARY
Glucose-Capillary: 117 mg/dL — ABNORMAL HIGH (ref 70–99)
Glucose-Capillary: 195 mg/dL — ABNORMAL HIGH (ref 70–99)

## 2021-08-10 SURGERY — EXCISION MASS
Anesthesia: General | Site: Elbow | Laterality: Left

## 2021-08-10 MED ORDER — CEFAZOLIN SODIUM-DEXTROSE 2-3 GM-%(50ML) IV SOLR
INTRAVENOUS | Status: DC | PRN
  Administered 2021-08-10: 2 g via INTRAVENOUS

## 2021-08-10 MED ORDER — EPHEDRINE SULFATE (PRESSORS) 50 MG/ML IJ SOLN
INTRAMUSCULAR | Status: DC | PRN
  Administered 2021-08-10: 10 mg via INTRAVENOUS

## 2021-08-10 MED ORDER — CHLORHEXIDINE GLUCONATE CLOTH 2 % EX PADS: 6.0000 | MEDICATED_PAD | Freq: Once | CUTANEOUS | Status: DC

## 2021-08-10 MED ORDER — PROPOFOL 500 MG/50ML IV EMUL
INTRAVENOUS | Status: AC
  Filled 2021-08-10: qty 100

## 2021-08-10 MED ORDER — PROPOFOL 10 MG/ML IV BOLUS
INTRAVENOUS | Status: DC | PRN
  Administered 2021-08-10: 150 mg via INTRAVENOUS

## 2021-08-10 MED ORDER — LACTATED RINGERS IV SOLN: INTRAVENOUS | Status: DC

## 2021-08-10 MED ORDER — MIDAZOLAM HCL 2 MG/2ML IJ SOLN
INTRAMUSCULAR | Status: AC
  Filled 2021-08-10: qty 2

## 2021-08-10 MED ORDER — ACETAMINOPHEN 500 MG PO TABS
ORAL_TABLET | ORAL | Status: AC
  Filled 2021-08-10: qty 2

## 2021-08-10 MED ORDER — FENTANYL CITRATE (PF) 100 MCG/2ML IJ SOLN
INTRAMUSCULAR | Status: DC | PRN
Start: 2021-08-10 — End: 2021-08-10
  Administered 2021-08-10 (×2): 50 ug via INTRAVENOUS

## 2021-08-10 MED ORDER — BUPIVACAINE-EPINEPHRINE 0.25% -1:200000 IJ SOLN
INTRAMUSCULAR | Status: DC | PRN
  Administered 2021-08-10: 20 mL

## 2021-08-10 MED ORDER — MIDAZOLAM HCL 5 MG/5ML IJ SOLN
INTRAMUSCULAR | Status: DC | PRN
  Administered 2021-08-10: 2 mg via INTRAVENOUS

## 2021-08-10 MED ORDER — HYDROMORPHONE HCL 1 MG/ML IJ SOLN: 0.2500 mg | INTRAMUSCULAR | Status: DC | PRN

## 2021-08-10 MED ORDER — PROPOFOL 500 MG/50ML IV EMUL
INTRAVENOUS | Status: DC | PRN
  Administered 2021-08-10: 25 ug/kg/min via INTRAVENOUS

## 2021-08-10 MED ORDER — LIDOCAINE-EPINEPHRINE 1 %-1:100000 IJ SOLN
INTRAMUSCULAR | Status: DC | PRN
  Administered 2021-08-10: 20 mL

## 2021-08-10 MED ORDER — CEFAZOLIN SODIUM-DEXTROSE 2-4 GM/100ML-% IV SOLN: 2.0000 g | INTRAVENOUS | Status: DC

## 2021-08-10 MED ORDER — ACETAMINOPHEN 500 MG PO TABS
1000.0000 mg | ORAL_TABLET | Freq: Once | ORAL | Status: AC
  Administered 2021-08-10: 1000 mg via ORAL

## 2021-08-10 MED ORDER — FENTANYL CITRATE (PF) 100 MCG/2ML IJ SOLN
INTRAMUSCULAR | Status: AC
  Filled 2021-08-10: qty 2

## 2021-08-10 MED ORDER — ONDANSETRON HCL 4 MG/2ML IJ SOLN
INTRAMUSCULAR | Status: DC | PRN
  Administered 2021-08-10: 4 mg via INTRAVENOUS

## 2021-08-10 MED ORDER — LIDOCAINE 2% (20 MG/ML) 5 ML SYRINGE
INTRAMUSCULAR | Status: DC | PRN
  Administered 2021-08-10: 60 mg via INTRAVENOUS

## 2021-08-10 MED ORDER — DEXAMETHASONE SODIUM PHOSPHATE 10 MG/ML IJ SOLN
INTRAMUSCULAR | Status: DC | PRN
  Administered 2021-08-10: 4 mg via INTRAVENOUS

## 2021-08-10 SURGICAL SUPPLY — 76 items
APL PRP STRL LF DISP 70% ISPRP (MISCELLANEOUS) ×1
APL SKNCLS STERI-STRIP NONHPOA (GAUZE/BANDAGES/DRESSINGS)
BAND INSRT 18 STRL LF DISP RB (MISCELLANEOUS)
BAND RUBBER #18 3X1/16 STRL (MISCELLANEOUS) IMPLANT
BENZOIN TINCTURE PRP APPL 2/3 (GAUZE/BANDAGES/DRESSINGS) IMPLANT
BLADE CLIPPER SURG (BLADE) IMPLANT
BLADE SURG 15 STRL LF DISP TIS (BLADE) ×1 IMPLANT
BLADE SURG 15 STRL SS (BLADE) ×6
BNDG COHESIVE 4X5 TAN ST LF (GAUZE/BANDAGES/DRESSINGS) ×1 IMPLANT
BNDG GAUZE ELAST 4 BULKY (GAUZE/BANDAGES/DRESSINGS) ×3 IMPLANT
CANISTER SUCT 1200ML W/VALVE (MISCELLANEOUS) IMPLANT
CHLORAPREP W/TINT 26 (MISCELLANEOUS) ×2 IMPLANT
COVER BACK TABLE 60X90IN (DRAPES) ×2 IMPLANT
COVER MAYO STAND STRL (DRAPES) ×2 IMPLANT
DRAIN JP 10F RND SILICONE (MISCELLANEOUS) IMPLANT
DRAPE LAPAROTOMY 100X72 PEDS (DRAPES) IMPLANT
DRAPE U-SHAPE 76X120 STRL (DRAPES) IMPLANT
DRAPE UTILITY XL STRL (DRAPES) ×2 IMPLANT
DRSG PAD ABDOMINAL 8X10 ST (GAUZE/BANDAGES/DRESSINGS) ×1 IMPLANT
DRSG TELFA 3X8 NADH (GAUZE/BANDAGES/DRESSINGS) IMPLANT
ELECT COATED BLADE 2.86 ST (ELECTRODE) IMPLANT
ELECT NDL BLADE 2-5/6 (NEEDLE) ×1 IMPLANT
ELECT NEEDLE BLADE 2-5/6 (NEEDLE) ×2 IMPLANT
ELECT REM PT RETURN 9FT ADLT (ELECTROSURGICAL)
ELECT REM PT RETURN 9FT PED (ELECTROSURGICAL)
ELECTRODE REM PT RETRN 9FT PED (ELECTROSURGICAL) IMPLANT
ELECTRODE REM PT RTRN 9FT ADLT (ELECTROSURGICAL) IMPLANT
EVACUATOR SILICONE 100CC (DRAIN) IMPLANT
GAUZE SPONGE 4X4 12PLY STRL LF (GAUZE/BANDAGES/DRESSINGS) ×1 IMPLANT
GAUZE XEROFORM 1X8 LF (GAUZE/BANDAGES/DRESSINGS) IMPLANT
GLOVE BIOGEL M STRL SZ7.5 (GLOVE) ×2 IMPLANT
GLOVE BIOGEL PI IND STRL 8 (GLOVE) ×1 IMPLANT
GLOVE BIOGEL PI INDICATOR 8 (GLOVE) ×1
GOWN STRL REUS W/ TWL LRG LVL3 (GOWN DISPOSABLE) ×2 IMPLANT
GOWN STRL REUS W/TWL LRG LVL3 (GOWN DISPOSABLE) ×4
NDL FILTER BLUNT 18X1 1/2 (NEEDLE) IMPLANT
NDL HYPO 27GX1-1/4 (NEEDLE) ×1 IMPLANT
NDL HYPO 30GX1 BEV (NEEDLE) IMPLANT
NDL SAFETY ECLIPSE 18X1.5 (NEEDLE) IMPLANT
NEEDLE FILTER BLUNT 18X 1/2SAF (NEEDLE)
NEEDLE FILTER BLUNT 18X1 1/2 (NEEDLE) IMPLANT
NEEDLE HYPO 18GX1.5 SHARP (NEEDLE)
NEEDLE HYPO 27GX1-1/4 (NEEDLE) ×2 IMPLANT
NEEDLE HYPO 30GX1 BEV (NEEDLE) IMPLANT
NS IRRIG 1000ML POUR BTL (IV SOLUTION) IMPLANT
PACK BASIN DAY SURGERY FS (CUSTOM PROCEDURE TRAY) ×2 IMPLANT
PAD DRESSING TELFA 3X8 NADH (GAUZE/BANDAGES/DRESSINGS) IMPLANT
PENCIL SMOKE EVACUATOR (MISCELLANEOUS) ×2 IMPLANT
SHEET MEDIUM DRAPE 40X70 STRL (DRAPES) IMPLANT
SLEEVE SCD COMPRESS KNEE MED (STOCKING) IMPLANT
SPONGE GAUZE 2X2 8PLY STRL LF (GAUZE/BANDAGES/DRESSINGS) IMPLANT
SPONGE T-LAP 18X18 ~~LOC~~+RFID (SPONGE) ×1 IMPLANT
STAPLER INSORB 30 2030 C-SECTI (MISCELLANEOUS) ×1 IMPLANT
STAPLER VISISTAT 35W (STAPLE) ×2 IMPLANT
STOCKINETTE IMPERVIOUS LG (DRAPES) ×1 IMPLANT
STRIP CLOSURE SKIN 1/2X4 (GAUZE/BANDAGES/DRESSINGS) IMPLANT
SUCTION FRAZIER HANDLE 10FR (MISCELLANEOUS) ×2
SUCTION TUBE FRAZIER 10FR DISP (MISCELLANEOUS) IMPLANT
SUT ETHILON 4 0 PS 2 18 (SUTURE) IMPLANT
SUT MNCRL AB 4-0 PS2 18 (SUTURE) ×1 IMPLANT
SUT MON AB 5-0 P3 18 (SUTURE) IMPLANT
SUT PDS 3-0 CT2 (SUTURE)
SUT PDS II 3-0 CT2 27 ABS (SUTURE) IMPLANT
SUT PLAIN 5 0 P 3 18 (SUTURE) IMPLANT
SUT PROLENE 5 0 P 3 (SUTURE) IMPLANT
SUT VICRYL 4-0 PS2 18IN ABS (SUTURE) IMPLANT
SUT VLOC 90 P-14 23 (SUTURE) IMPLANT
SWAB COLLECTION DEVICE MRSA (MISCELLANEOUS) IMPLANT
SWAB CULTURE ESWAB REG 1ML (MISCELLANEOUS) IMPLANT
SYR 50ML LL SCALE MARK (SYRINGE) IMPLANT
SYR BULB EAR ULCER 3OZ GRN STR (SYRINGE) ×1 IMPLANT
SYR CONTROL 10ML LL (SYRINGE) ×2 IMPLANT
TOWEL GREEN STERILE FF (TOWEL DISPOSABLE) ×2 IMPLANT
TRAY DSU PREP LF (CUSTOM PROCEDURE TRAY) IMPLANT
TUBE CONNECTING 20X1/4 (TUBING) ×1 IMPLANT
TUBING INFILTRATION IT-10001 (TUBING) IMPLANT

## 2021-08-10 NOTE — Transfer of Care (Signed)
Immediate Anesthesia Transfer of Care Note  Patient: Tim Vincent  Procedure(s) Performed: EXCISION ELBOW MASS (Left: Elbow)  Patient Location: PACU  Anesthesia Type:General  Level of Consciousness: drowsy  Airway & Oxygen Therapy: Patient Spontanous Breathing and Patient connected to face mask oxygen  Post-op Assessment: Report given to RN and Post -op Vital signs reviewed and stable  Post vital signs: Reviewed and stable  Last Vitals:   Dr. Sampson Goon aware of BP Vitals Value Taken Time  BP 205/77 08/10/21 1252  Temp    Pulse 80 08/10/21 1256  Resp 12 08/10/21 1256  SpO2 100 % 08/10/21 1256  Vitals shown include unvalidated device data.  Last Pain:  Vitals:   08/10/21 1000  TempSrc: Oral  PainSc: 0-No pain         Complications: No notable events documented.

## 2021-08-10 NOTE — Interval H&P Note (Signed)
History and Physical Interval Note:  08/10/2021 9:49 AM  [name]  has presented today for surgery, with the diagnosis of Gout with Tophus.  The various methods of treatment have been discussed with the patient and family. After consideration of risks, benefits and other options for treatment, the patient has consented to  Procedure(s) with comments: EXCISION ELBOW MASS (Left) - 1 hour as a surgical intervention.  The patient's history has been reviewed, patient examined, no change in status, stable for surgery.  I have reviewed the patient's chart and labs.  Questions were answered to the patient's satisfaction.     Tim Vincent

## 2021-08-10 NOTE — Anesthesia Postprocedure Evaluation (Signed)
Anesthesia Post Note  Patient: Akeen Fogal  Procedure(s) Performed: EXCISION ELBOW MASS (Left: Elbow)     Patient location during evaluation: PACU Anesthesia Type: General Level of consciousness: awake and alert Pain management: pain level controlled Vital Signs Assessment: post-procedure vital signs reviewed and stable Respiratory status: spontaneous breathing, nonlabored ventilation and respiratory function stable Cardiovascular status: blood pressure returned to baseline and stable Postop Assessment: no apparent nausea or vomiting Anesthetic complications: no   No notable events documented.  Last Vitals:  Vitals:   08/10/21 1315 08/10/21 1334  BP: (!) 193/80 (!) 205/86  Pulse: 76 76  Resp: 14 14  Temp:  36.7 C  SpO2: 100% 98%    Last Pain:  Vitals:   08/10/21 1334  TempSrc:   PainSc: 0-No pain                 Ollie Esty,W. EDMOND

## 2021-08-10 NOTE — Discharge Instructions (Addendum)
No heavy activities. Elevate arm to reduce swelling.  Diet: Regular  Wound Care: Keep dressing clean & dry. Leave dressing in place until your post-op appointment in 1 week. Place bag over left arm when showering to prevent water contact.   Call doctor if any unusual problems occur such as pain, excessive bleeding, unrelieved nausea/vomiting, fever &/or chills.  No Tylenol before 4pm 08/10/21   Post Anesthesia Home Care Instructions  Activity: Get plenty of rest for the remainder of the day. A responsible individual must stay with you for 24 hours following the procedure.  For the next 24 hours, DO NOT: -Drive a car -Advertising copywriter -Drink alcoholic beverages -Take any medication unless instructed by your physician -Make any legal decisions or sign important papers.  Meals: Start with liquid foods such as gelatin or soup. Progress to regular foods as tolerated. Avoid greasy, spicy, heavy foods. If nausea and/or vomiting occur, drink only clear liquids until the nausea and/or vomiting subsides. Call your physician if vomiting continues.  Special Instructions/Symptoms: Your throat may feel dry or sore from the anesthesia or the breathing tube placed in your throat during surgery. If this causes discomfort, gargle with warm salt water. The discomfort should disappear within 24 hours.  If you had a scopolamine patch placed behind your ear for the management of post- operative nausea and/or vomiting:  1. The medication in the patch is effective for 72 hours, after which it should be removed.  Wrap patch in a tissue and discard in the trash. Wash hands thoroughly with soap and water. 2. You may remove the patch earlier than 72 hours if you experience unpleasant side effects which may include dry mouth, dizziness or visual disturbances. 3. Avoid touching the patch. Wash your hands with soap and water after contact with the patch.

## 2021-08-10 NOTE — Anesthesia Preprocedure Evaluation (Addendum)
Anesthesia Evaluation  Patient identified by MRN, date of birth, ID band Patient awake    Reviewed: Allergy & Precautions, H&P , NPO status , Patient's Chart, lab work & pertinent test results  Airway Mallampati: II  TM Distance: >3 FB Neck ROM: Full    Dental no notable dental hx. (+) Teeth Intact, Dental Advisory Given   Pulmonary neg pulmonary ROS,    Pulmonary exam normal breath sounds clear to auscultation       Cardiovascular hypertension, Pt. on medications  Rhythm:Regular Rate:Normal     Neuro/Psych negative neurological ROS  negative psych ROS   GI/Hepatic negative GI ROS, Neg liver ROS,   Endo/Other  diabetes, Type 2, Oral Hypoglycemic Agents  Renal/GU negative Renal ROS  negative genitourinary   Musculoskeletal   Abdominal   Peds  Hematology negative hematology ROS (+)   Anesthesia Other Findings   Reproductive/Obstetrics negative OB ROS                            Anesthesia Physical Anesthesia Plan  ASA: 2  Anesthesia Plan: General   Post-op Pain Management: Tylenol PO (pre-op)*   Induction: Intravenous  PONV Risk Score and Plan: 3 and Ondansetron, Dexamethasone and Midazolam  Airway Management Planned: LMA  Additional Equipment:   Intra-op Plan:   Post-operative Plan: Extubation in OR  Informed Consent: I have reviewed the patients History and Physical, chart, labs and discussed the procedure including the risks, benefits and alternatives for the proposed anesthesia with the patient or authorized representative who has indicated his/her understanding and acceptance.     Dental advisory given and Interpreter used for interveiw  Plan Discussed with: CRNA  Anesthesia Plan Comments:        Anesthesia Quick Evaluation

## 2021-08-10 NOTE — Anesthesia Procedure Notes (Signed)
Procedure Name: LMA Insertion Date/Time: 08/10/2021 11:40 AM Performed by: Sheryn Bison, CRNA Pre-anesthesia Checklist: Patient identified, Emergency Drugs available, Suction available and Patient being monitored Patient Re-evaluated:Patient Re-evaluated prior to induction Oxygen Delivery Method: Circle System Utilized Preoxygenation: Pre-oxygenation with 100% oxygen Induction Type: IV induction Ventilation: Mask ventilation without difficulty LMA: LMA inserted LMA Size: 4.0 Number of attempts: 1 Airway Equipment and Method: bite block Placement Confirmation: positive ETCO2 Tube secured with: Tape Dental Injury: Teeth and Oropharynx as per pre-operative assessment

## 2021-08-10 NOTE — Op Note (Signed)
Operative Note   DATE OF OPERATION: 08/10/2021  SURGICAL DEPARTMENT: Plastic Surgery  PREOPERATIVE DIAGNOSES: Left elbow gouty tophus  POSTOPERATIVE DIAGNOSES:  same  PROCEDURE: 1.  Excision of left elbow gouty tophus totaling 20 cm in length 2.  Complex closure left elbow totaling 20 cm in length  SURGEON: Talmadge Coventry, MD  ASSISTANT: Verdie Shire, PA The advanced practice practitioner (APP) assisted throughout the case.  The APP was essential in retraction and counter traction when needed to make the case progress smoothly.  This retraction and assistance made it possible to see the tissue planes for the procedure.  The assistance was needed for hemostasis, tissue re-approximation and closure of the incision site.   ANESTHESIA:  General.   COMPLICATIONS: None.   INDICATIONS FOR PROCEDURE:  The patient, Tim Vincent is a 66 y.o. male born on 08-Jul-1955, is here for treatment of symptomatic left elbow gouty tophus MRN: [id number]  CONSENT:  Informed consent was obtained directly from the patient. Risks, benefits and alternatives were fully discussed. Specific risks including but not limited to bleeding, infection, hematoma, seroma, scarring, pain, contracture, asymmetry, wound healing problems, and need for further surgery were all discussed. The patient did have an ample opportunity to have questions answered to satisfaction.   DESCRIPTION OF PROCEDURE:  The patient was taken to the operating room. SCDs were placed and antibiotics were given.  General anesthesia was administered.  The patient's operative site was prepped and draped in a sterile fashion. A time out was performed and all information was confirmed to be correct.  Started by marking out a planned elliptical incision over the gouty tophus.  This extended from the mid forearm up just proximal to the elbow.  Combination of Marcaine and lidocaine with epinephrine was then infiltrated and given time to work.  I then  incised along the skin incision with a 15 blade and undermined out in either direction to encompass the entirety of the specimen.  This clinically appeared to as a chalky mass consistent with a gouty tophus.  I then came around it on either side and underneath it and it was excised in its entirety.  A few additional fragments of remaining tophus were then removed.  The mass was retracted radially to bring the dissection away from the cubital tunnel which was not entered.  Hemostasis was then ensured.  Irrigation was performed.  Surrounding skin was then undermined and advanced and closed in layers with interrupted buried INSORB staples and a running 4-0 Monocryl.  A bulky compressive dressing was then applied.  The patient tolerated the procedure well.  There were no complications. The patient was allowed to wake from anesthesia, extubated and taken to the recovery room in satisfactory condition.

## 2021-08-13 ENCOUNTER — Encounter (HOSPITAL_BASED_OUTPATIENT_CLINIC_OR_DEPARTMENT_OTHER): Payer: Self-pay | Admitting: Plastic Surgery

## 2021-08-13 LAB — SURGICAL PATHOLOGY

## 2021-08-16 ENCOUNTER — Ambulatory Visit (INDEPENDENT_AMBULATORY_CARE_PROVIDER_SITE_OTHER): Payer: Medicare Other | Admitting: Plastic Surgery

## 2021-08-16 DIAGNOSIS — M1A9XX1 Chronic gout, unspecified, with tophus (tophi): Secondary | ICD-10-CM

## 2021-08-16 NOTE — Progress Notes (Signed)
Patient presents postop from excision of a left elbow mass.  He is overall feeling good.  He had some swelling in his hand but this is gone down.  On exam his incision is healing nicely.  No signs of any threatening skin.  Full range of motion of his hand and full sensation.  Normal and intact intrinsic muscular function.  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  He is overall happy.  We will plan to continue compressive wrap for another week and he can then begin to range his elbow as tolerated.  I will see him in about a month and we will check on how things are coming along and if everything looks good we will plan to set up to do the right side.  All of his questions were answered.

## 2021-08-21 ENCOUNTER — Ambulatory Visit (INDEPENDENT_AMBULATORY_CARE_PROVIDER_SITE_OTHER): Payer: Medicare Other | Admitting: Surgical

## 2021-08-21 ENCOUNTER — Encounter: Payer: Self-pay | Admitting: Student

## 2021-08-21 DIAGNOSIS — M1A9XX1 Chronic gout, unspecified, with tophus (tophi): Secondary | ICD-10-CM

## 2021-08-21 NOTE — Progress Notes (Signed)
Patient is a 66 year old male with history of left elbow gouty tophus.  Patient underwent excision of left elbow gouty tophus and complex closure of the left elbow with Dr. Claudia Desanctis on 08/10/2021.  Patient was scheduled for follow-up early July, however presents today with his daughter for concerns over bleeding and redness.  Patient reports she is feeling well, reports swelling has improved since his last appointment.  Does not report any increased pain.  No infectious symptoms.  On exam left elbow incision is intact, healing well.  There is some mild swelling of the forearm and elbow, no more than expected.  Palpable radial pulses noted.  Good range of motion of his wrist and all 5 digits is noted.  Sensation intact distally.  No cellulitic changes.  There is some slight irritation at the position of the lateral epicondyle, no concern for infection at this time.  No purulence, no fluctuance is noted.  No increased tenderness with palpation.  Recommend continue with restrictions, follow-up at next scheduled appointment.  We discussed physical exam findings that would be of concern, all the patient and his daughter's questions were answered to their content.  Call with questions or concerns.

## 2021-09-13 ENCOUNTER — Encounter: Payer: Medicare Other | Admitting: Plastic Surgery

## 2021-09-13 ENCOUNTER — Encounter: Payer: Self-pay | Admitting: Surgical

## 2021-09-13 ENCOUNTER — Ambulatory Visit (INDEPENDENT_AMBULATORY_CARE_PROVIDER_SITE_OTHER): Payer: Medicare Other | Admitting: Surgical

## 2021-09-13 DIAGNOSIS — M1A9XX1 Chronic gout, unspecified, with tophus (tophi): Secondary | ICD-10-CM

## 2021-09-13 NOTE — Progress Notes (Signed)
Patient is a 66 year old male here for follow-up after excision of gouty tophi with Dr. Arita Miss on 08/10/2021. He is just over 1 month postop.  He presents today with his daughter and a Spanish interpreter.  He reports he is doing well. He reports that he is getting back to normal activities.  He does report he is having some tenderness in the proximal forearm/elbow area. He is interested in proceeding with surgery on the right side for excision of the right elbow gouty tophi  On exam left elbow/forearm incision is intact, healing well.  He does have a small wound that is approximately 1 x 1 mm without any surrounding erythema or cellulitic changes noted.  He has normal range of motion of the left hand, palpable radial pulse, normal sensation intact distally.  Distal extremity has normal color and warm to touch.  Right elbow with 2 large gouty tophi present.  Nontender to palpation.  No overlying skin changes or erythema or cellulitic changes.   We discussed small amount of Vaseline on the left elbow incisional wound. We will discuss with Dr. Arita Miss plan for second surgery with excision of right forearm gouty tophi. Recommend calling with questions or concerns, pictures were taken and placed in the patient's chart with patient's permission.  We will plan to submit to insurance for excision of the right elbow/arm mass.

## 2021-10-04 ENCOUNTER — Ambulatory Visit (INDEPENDENT_AMBULATORY_CARE_PROVIDER_SITE_OTHER): Payer: Medicare Other | Admitting: Surgical

## 2021-10-04 DIAGNOSIS — E113292 Type 2 diabetes mellitus with mild nonproliferative diabetic retinopathy without macular edema, left eye: Secondary | ICD-10-CM | POA: Diagnosis not present

## 2021-10-04 DIAGNOSIS — M1A9XX1 Chronic gout, unspecified, with tophus (tophi): Secondary | ICD-10-CM

## 2021-10-04 DIAGNOSIS — E113211 Type 2 diabetes mellitus with mild nonproliferative diabetic retinopathy with macular edema, right eye: Secondary | ICD-10-CM | POA: Diagnosis not present

## 2021-10-04 NOTE — Progress Notes (Signed)
Patient is a 66 year old male here for follow-up after excision of gouty tophi with Dr. Arita Miss on 08/10/2021.  He presents today with his daughter and Spanish interpreter.  He presents for concerns related to a small pustule just adjacent to the left elbow incision.  He reports it is tender to palpation.  He is not having any infectious symptoms.  On exam he has a 1 mm pustule with a scabbed center.  I do not appreciate any surrounding erythema or cellulitic change.  It is tender with palpation.  There is no surrounding fluctuance.  His incision is intact and healing well.  Discussed with patient the pustule is likely related to a suture underneath the skin, discussed with patient that this may rupture spontaneously over the next few days or week.  It does not appear infected to me.  It did not easily rupture with digital pressure.  All of his questions were answered to his content with the use of a Spanish interpreter.  I do not see any signs of concern on exam, recommend calling with questions or concerns and following up as needed.

## 2021-10-05 ENCOUNTER — Ambulatory Visit (INDEPENDENT_AMBULATORY_CARE_PROVIDER_SITE_OTHER): Payer: Medicare Other | Admitting: Plastic Surgery

## 2021-10-05 DIAGNOSIS — M1A9XX1 Chronic gout, unspecified, with tophus (tophi): Secondary | ICD-10-CM

## 2021-10-06 NOTE — Progress Notes (Signed)
   Referring Provider Norm Salt, PA [address] [place],  [place] [address]   CC:  Gouty tophus.   Tim Vincent is an 66 y.o. male.  HPI: Patient is a 66 year old who had previous excision of his left elbow mass.  This is a gouty tophus he is interested in having surgery of his right elbow mass since the first surgery went well.  He has not been followed by primary care or rheumatology for his gout.    No Known Allergies  Outpatient Encounter Medications as of 10/05/2021  Medication Sig   lisinopril (PRINIVIL,ZESTRIL) 10 MG tablet Take 1 tablet (10 mg total) by mouth daily.   METFORMIN HCL PO Take by mouth daily.   ondansetron (ZOFRAN-ODT) 4 MG disintegrating tablet Take 1 tablet (4 mg total) by mouth every 8 (eight) hours as needed for nausea or vomiting.   No facility-administered encounter medications on file as of 10/05/2021.     Past Medical History:  Diagnosis Date   Diabetes mellitus without complication (HCC)    Hypertension     Past Surgical History:  Procedure Laterality Date   MASS EXCISION Left 04/17/2021   Procedure: EXCISION LEFT ELBOW MASS;  Surgeon: Allena Napoleon, MD;  Location: [facility];  Service: Plastics;  Laterality: Left;  1 hour   MASS EXCISION Left 08/10/2021   Procedure: EXCISION ELBOW MASS;  Surgeon: Allena Napoleon, MD;  Location: [facility];  Service: Plastics;  Laterality: Left;  1 hour    No family history on file.  Social History   Social History Narrative   Not on file     Review of Systems General: Denies fevers, chills, weight loss CV: Denies chest pain, shortness of breath, palpitations   Physical Exam    08/10/2021    1:34 PM 08/10/2021    1:15 PM 08/10/2021    1:00 PM  Vitals with BMI  Systolic 205 193 644  Diastolic 86 80 74  Pulse 76 76 85    General:  No acute distress,  Alert and oriented, Non-Toxic, Normal speech and affect Extremity: Well-healed scar left elbow with possible  some  re-deposition of crystals.  Prominent tophi right elbow.  Assessment/Plan Excision of right elbow would likely help the patient but given that he may be already read the spotting tissues on the left elbow I want him to be seen by primary care or rheumatology to see whether he needs to be on medication for his chronic tophaceous gout.    [name] 10/06/2021, 4:29 PM

## 2021-10-12 DIAGNOSIS — E1142 Type 2 diabetes mellitus with diabetic polyneuropathy: Secondary | ICD-10-CM | POA: Diagnosis not present

## 2021-10-12 DIAGNOSIS — N1832 Chronic kidney disease, stage 3b: Secondary | ICD-10-CM | POA: Diagnosis not present

## 2021-10-12 DIAGNOSIS — M1A9XX1 Chronic gout, unspecified, with tophus (tophi): Secondary | ICD-10-CM | POA: Diagnosis not present

## 2021-10-12 DIAGNOSIS — E1122 Type 2 diabetes mellitus with diabetic chronic kidney disease: Secondary | ICD-10-CM | POA: Diagnosis not present

## 2021-10-12 DIAGNOSIS — I10 Essential (primary) hypertension: Secondary | ICD-10-CM | POA: Diagnosis not present

## 2021-10-12 DIAGNOSIS — E782 Mixed hyperlipidemia: Secondary | ICD-10-CM | POA: Diagnosis not present

## 2021-10-26 DIAGNOSIS — M1A9XX1 Chronic gout, unspecified, with tophus (tophi): Secondary | ICD-10-CM | POA: Diagnosis not present

## 2021-10-26 DIAGNOSIS — E782 Mixed hyperlipidemia: Secondary | ICD-10-CM | POA: Diagnosis not present

## 2021-10-26 DIAGNOSIS — I10 Essential (primary) hypertension: Secondary | ICD-10-CM | POA: Diagnosis not present

## 2021-10-26 DIAGNOSIS — L97511 Non-pressure chronic ulcer of other part of right foot limited to breakdown of skin: Secondary | ICD-10-CM | POA: Diagnosis not present

## 2021-10-26 DIAGNOSIS — E1122 Type 2 diabetes mellitus with diabetic chronic kidney disease: Secondary | ICD-10-CM | POA: Diagnosis not present

## 2021-10-26 DIAGNOSIS — E1142 Type 2 diabetes mellitus with diabetic polyneuropathy: Secondary | ICD-10-CM | POA: Diagnosis not present

## 2021-10-26 DIAGNOSIS — N1832 Chronic kidney disease, stage 3b: Secondary | ICD-10-CM | POA: Diagnosis not present

## 2021-11-16 ENCOUNTER — Encounter (HOSPITAL_BASED_OUTPATIENT_CLINIC_OR_DEPARTMENT_OTHER): Payer: Medicare Other | Attending: Internal Medicine | Admitting: Internal Medicine

## 2021-11-16 DIAGNOSIS — E113211 Type 2 diabetes mellitus with mild nonproliferative diabetic retinopathy with macular edema, right eye: Secondary | ICD-10-CM | POA: Diagnosis not present

## 2021-11-16 DIAGNOSIS — N183 Chronic kidney disease, stage 3 unspecified: Secondary | ICD-10-CM | POA: Insufficient documentation

## 2021-11-16 DIAGNOSIS — E1142 Type 2 diabetes mellitus with diabetic polyneuropathy: Secondary | ICD-10-CM | POA: Diagnosis not present

## 2021-11-16 DIAGNOSIS — I129 Hypertensive chronic kidney disease with stage 1 through stage 4 chronic kidney disease, or unspecified chronic kidney disease: Secondary | ICD-10-CM | POA: Diagnosis not present

## 2021-11-16 DIAGNOSIS — E1122 Type 2 diabetes mellitus with diabetic chronic kidney disease: Secondary | ICD-10-CM | POA: Diagnosis not present

## 2021-11-16 DIAGNOSIS — E785 Hyperlipidemia, unspecified: Secondary | ICD-10-CM | POA: Diagnosis not present

## 2021-11-16 DIAGNOSIS — E11621 Type 2 diabetes mellitus with foot ulcer: Secondary | ICD-10-CM | POA: Insufficient documentation

## 2021-11-16 DIAGNOSIS — E113292 Type 2 diabetes mellitus with mild nonproliferative diabetic retinopathy without macular edema, left eye: Secondary | ICD-10-CM | POA: Diagnosis not present

## 2021-11-16 DIAGNOSIS — L97512 Non-pressure chronic ulcer of other part of right foot with fat layer exposed: Secondary | ICD-10-CM | POA: Diagnosis not present

## 2021-11-16 DIAGNOSIS — L97522 Non-pressure chronic ulcer of other part of left foot with fat layer exposed: Secondary | ICD-10-CM | POA: Insufficient documentation

## 2021-11-16 DIAGNOSIS — H47393 Other disorders of optic disc, bilateral: Secondary | ICD-10-CM | POA: Diagnosis not present

## 2021-11-16 DIAGNOSIS — H2513 Age-related nuclear cataract, bilateral: Secondary | ICD-10-CM | POA: Diagnosis not present

## 2021-11-16 NOTE — Progress Notes (Addendum)
CORIN, CUZZORT ([id number]) [id number] Page 1 of 9 Visit Report for 11/16/2021 Allergy List Details Patient Name: Date of Service: Tim Vincent, Tim Vincent 11/16/2021 8:00 A M Medical Record Number: [id number] Patient Account Number: [id number] Date of Birth/Sex: Treating RN: 10-06-1955 (66 y.o. Mare Ferrari Primary Care Traveion Ruddock: Raelyn Number Other Clinician: Referring Kamir Selover: Treating Merrissa Giacobbe/Extender: Gilman Buttner Weeks in Treatment: 0 Allergies Active Allergies No Known Drug Allergies Allergy Notes Electronic Signature(s) Signed: 11/16/2021 12:28:23 PM By: Sharyn Creamer RN, BSN Entered By: Sharyn Creamer on 11/16/2021 08:38:45 -------------------------------------------------------------------------------- Arrival Information Details Patient Name: Date of Service: Tim Vincent, Tim Vincent 11/16/2021 8:00 A M Medical Record Number: [id number] Patient Account Number: [id number] Date of Birth/Sex: Treating RN: 1955/04/05 (66 y.o. Mare Ferrari Primary Care Ameshia Pewitt: Raelyn Number Other Clinician: Referring Andrianna Manalang: Treating Min Tunnell/Extender: Sammie Bench in Treatment: 0 Visit Information Patient Arrived: Ambulatory Arrival Time: 08:35 Accompanied By: daughter, interpreter Transfer Assistance: None Patient Identification Verified: Yes Secondary Verification Process Completed: Yes Patient Requires Transmission-Based Precautions: No Patient Has Alerts: No Electronic Signature(s) Signed: 11/16/2021 12:28:23 PM By: Sharyn Creamer RN, BSN Entered By: Sharyn Creamer on 11/16/2021 08:35:29 -------------------------------------------------------------------------------- Clinic Level of Care Assessment Details Patient Name: Date of Service: Tim Vincent, Tim Vincent 11/16/2021 8:00 A M Medical Record Number: [id number] Patient Account Number: [id number] Tim Vincent, Tim Vincent ([id number]) [id number] Page  2 of 9 Date of Birth/Sex: Treating RN: December 08, 1955 (66 y.o. Mare Ferrari Primary Care Icis Budreau: Other Clinician: Raelyn Number Referring Aftin Lye: Treating Amarria Andreasen/Extender: Sammie Bench in Treatment: 0 Clinic Level of Care Assessment Items TOOL 1 Quantity Score X- 1 0 Use when EandM and Procedure is performed on INITIAL visit ASSESSMENTS - Nursing Assessment / Reassessment X- 1 20 General Physical Exam (combine w/ comprehensive assessment (listed just below) when performed on new pt. evals) X- 1 25 Comprehensive Assessment (HX, ROS, Risk Assessments, Wounds Hx, etc.) ASSESSMENTS - Wound and Skin Assessment / Reassessment []$  - 0 Dermatologic / Skin Assessment (not related to wound area) ASSESSMENTS - Ostomy and/or Continence Assessment and Care []$  - 0 Incontinence Assessment and Management []$  - 0 Ostomy Care Assessment and Management (repouching, etc.) PROCESS - Coordination of Care []$  - 0 Simple Patient / Family Education for ongoing care X- 1 20 Complex (extensive) Patient / Family Education for ongoing care X- 1 10 Staff obtains Programmer, systems, Records, T Results / Process Orders est []$  - 0 Staff telephones HHA, Nursing Homes / Clarify orders / etc []$  - 0 Routine Transfer to another Facility (non-emergent condition) []$  - 0 Routine Hospital Admission (non-emergent condition) X- 1 15 New Admissions / Biomedical engineer / Ordering NPWT Apligraf, etc. , []$  - 0 Emergency Hospital Admission (emergent condition) PROCESS - Special Needs []$  - 0 Pediatric / Minor Patient Management []$  - 0 Isolation Patient Management X- 1 15 Hearing / Language / Visual special needs []$  - 0 Assessment of Community assistance (transportation, D/C planning, etc.) []$  - 0 Additional assistance / Altered mentation []$  - 0 Support Surface(s) Assessment (bed, cushion, seat, etc.) INTERVENTIONS - Miscellaneous []$  - 0 External ear exam []$  - 0 Patient  Transfer (multiple staff / Civil Service fast streamer / Similar devices) []$  - 0 Simple Staple / Suture removal (25 or less) []$  - 0 Complex Staple / Suture removal (26 or more) []$  - 0 Hypo/Hyperglycemic Management (do not check if billed separately) X- 1 15 Ankle / Brachial Index (ABI) - do not check if billed separately Has the patient been seen at the  hospital within the last three years: Yes Total Score: 120 Level Of Care: New/Established - Level 4 Electronic Signature(s) Signed: 11/16/2021 12:28:23 PM By: Sharyn Creamer RN, BSN Entered By: Sharyn Creamer on 11/16/2021 12:24:09 Tim Vincent ([id number]) [id number] Page 3 of 9 -------------------------------------------------------------------------------- Encounter Discharge Information Details Patient Name: Date of Service: Tim Vincent, Tim Vincent 11/16/2021 8:00 A M Medical Record Number: [id number] Patient Account Number: [id number] Date of Birth/Sex: Treating RN: 10/25/1955 (66 y.o. Mare Ferrari Primary Care Emmelia Holdsworth: Raelyn Number Other Clinician: Referring Yalitza Teed: Treating Tamas Suen/Extender: Sammie Bench in Treatment: 0 Encounter Discharge Information Items Post Procedure Vitals Discharge Condition: Stable Temperature (F): 97.8 Ambulatory Status: Ambulatory Pulse (bpm): 92 Discharge Destination: Home Respiratory Rate (breaths/min): 16 Transportation: Private Auto Blood Pressure (mmHg): 184/90 Accompanied By: interpreter, daughter Schedule Follow-up Appointment: Yes Clinical Summary of Care: Patient Declined Electronic Signature(s) Signed: 11/16/2021 12:28:23 PM By: Sharyn Creamer RN, BSN Entered By: Sharyn Creamer on 11/16/2021 12:27:10 -------------------------------------------------------------------------------- Lower Extremity Assessment Details Patient Name: Date of Service: Tim Vincent, Tim Vincent 11/16/2021 8:00 A M Medical Record Number: [id number] Patient Account Number:  [id number] Date of Birth/Sex: Treating RN: 05/14/1955 (66 y.o. Mare Ferrari Primary Care Chay Mazzoni: Raelyn Number Other Clinician: Referring Erron Wengert: Treating Ciji Boston/Extender: Sammie Bench in Treatment: 0 Edema Assessment Assessed: Shirlyn Goltz: Yes] Patrice Paradise: Yes] [Left: Edema] [Right: :] Calf Left: Right: Point of Measurement: 32 cm From Medial Instep 36 cm 35 cm Ankle Left: Right: Point of Measurement: 11 cm From Medial Instep 22 cm 22.5 cm Knee To Floor Left: Right: From Medial Instep 45 cm Vascular Assessment Pulses: Dorsalis Pedis Palpable: [Left:Yes] [Right:Yes] Blood Pressure: Brachial: [Right:184] Ankle: [Right:Dorsalis Pedis: 104 0.57] Electronic Signature(s) Signed: 11/16/2021 12:28:23 PM By: Sharyn Creamer RN, BSN Entered By: Sharyn Creamer on 11/16/2021 10:36:19 Tim Vincent ([id number]) [id number] Page 4 of 9 -------------------------------------------------------------------------------- Multi Wound Chart Details Patient Name: Date of Service: Tim Vincent, Tim Vincent 11/16/2021 8:00 A M Medical Record Number: [id number] Patient Account Number: [id number] Date of Birth/Sex: Treating RN: [birth year] (66 y.o. M) Primary Care Shellia Hartl: Raelyn Number Other Clinician: Referring Tayquan Gassman: Treating Margarita Croke/Extender: Sammie Bench in Treatment: 0 Vital Signs Height(in): 67 Pulse(bpm): 37 Weight(lbs): 185 Blood Pressure(mmHg): 184/90 Body Mass Index(BMI): 29 Temperature(F): 97.8 Respiratory Rate(breaths/min): 16 [1:Photos:] Right, Lateral Foot Left, Plantar T Great oe Left Calcaneus Wound Location: Gradually Appeared Gradually Appeared Gradually Appeared Wounding Event: Diabetic Wound/Ulcer of the Lower Diabetic Wound/Ulcer of the Lower Diabetic Wound/Ulcer of the Lower Primary Etiology: Extremity Extremity Extremity Hypertension, Type II Diabetes, Gout, Hypertension, Type II  Diabetes, Gout, Hypertension, Type II Diabetes, Gout, Comorbid History: Neuropathy Neuropathy Neuropathy 10/25/2021 10/25/2021 10/25/2021 Date Acquired: 0 0 0 Weeks of Treatment: Open Open Open Wound Status: No No No Wound Recurrence: 7.2x2x0.2 0.5x0.5x0.2 0.7x1x0.1 Measurements L x W x D (cm) 11.31 0.196 0.55 A (cm) : rea 2.262 0.039 0.055 Volume (cm) : 0.00% 0.00% 0.00% % Reduction in A rea: 0.00% 0.00% 0.00% % Reduction in Volume: Grade 2 Grade 2 Grade 2 Classification: Medium Medium Medium Exudate A mount: Serosanguineous Serosanguineous Serosanguineous Exudate Type: red, brown red, brown red, brown Exudate Color: Small (1-33%) Medium (34-66%) None Present (0%) Granulation A mount: Red, Pink Red N/A Granulation Quality: Large (67-100%) Medium (34-66%) Large (67-100%) Necrotic A mount: Eschar, Adherent Slough Eschar, Adherent [facility], Adherent Slough Necrotic Tissue: Fat Layer (Subcutaneous Tissue): Yes Fat Layer (Subcutaneous Tissue): Yes Fat Layer (Subcutaneous Tissue): Yes Exposed Structures: Fascia: No Fascia: No Fascia: No Tendon: No Tendon: No Tendon:  No Muscle: No Muscle: No Muscle: No Joint: No Joint: No Joint: No Bone: No Bone: No Bone: No None None None Epithelialization: Debridement - Excisional Debridement - Excisional Debridement - Excisional Debridement: Pre-procedure Verification/Time Out 09:20 09:20 09:20 Taken: N/A Lidocaine 5% topical ointment Lidocaine 5% topical ointment Pain Control: Necrotic/Eschar, Subcutaneous, Callus, Subcutaneous, Slough Subcutaneous, Slough Tissue Debrided: Slough Skin/Subcutaneous Tissue Skin/Subcutaneous Tissue Skin/Subcutaneous Tissue Level: 14.4 0.25 0.7 Debridement A (sq cm): rea Curette Curette Curette Instrument: Minimum Minimum Minimum Bleeding: Pressure Pressure Pressure Hemostasis Achieved: 0 0 0 Procedural Pain: 0 0 0 Post Procedural Pain: Debridement Treatment Response:  Procedure was tolerated well Procedure was tolerated well Procedure was tolerated well Post Debridement Measurements L x 7.2x2x0.2 0.5x0.5x0.1 0.7x1x0.1 W x D (cm) 2.262 0.02 0.055 Post Debridement Volume: (cm) Tim Vincent, Tim Vincent ([id number] Page 5 of 9 Debridement Debridement Debridement Procedures Performed: Treatment Notes Electronic Signature(s) Signed: 11/16/2021 3:02:11 PM By: Kalman Shan DO Entered By: Kalman Shan on 11/16/2021 09:47:18 -------------------------------------------------------------------------------- Multi-Disciplinary Care Plan Details Patient Name: Date of Service: Tim Vincent, Tim Vincent 11/16/2021 8:00 A M Medical Record Number: [id number] Patient Account Number: [id number] Date of Birth/Sex: Treating RN: 11-Sep-1955 (66 y.o. Mare Ferrari Primary Care Chamille Werntz: Raelyn Number Other Clinician: Referring Natara Monfort: Treating Coen Miyasato/Extender: Sammie Bench in Treatment: 0 Active Inactive Electronic Signature(s) Signed: 12/31/2021 6:16:46 PM By: Deon Pilling RN, BSN Signed: 04/29/2022 9:56:31 AM By: Sharyn Creamer RN, BSN Previous Signature: 11/16/2021 12:28:23 PM Version By: Sharyn Creamer RN, BSN Entered By: Deon Pilling on 12/31/2021 18:14:06 -------------------------------------------------------------------------------- Pain Assessment Details Patient Name: Date of Service: Tim Vincent, Tim Vincent 11/16/2021 8:00 A M Medical Record Number: [id number] Patient Account Number: [id number] Date of Birth/Sex: Treating RN: May 30, 1955 (66 y.o. Mare Ferrari Primary Care Ladarien Beeks: Raelyn Number Other Clinician: Referring Tikisha Molinaro: Treating Kannen Moxey/Extender: Sammie Bench in Treatment: 0 Active Problems Location of Pain Severity and Description of Pain Patient Has Paino No Site Locations [place], [place] ([id number]) [id number] Page 6 of  9 Pain Management and Medication Current Pain Management: Electronic Signature(s) Signed: 11/16/2021 12:28:23 PM By: Sharyn Creamer RN, BSN Entered By: Sharyn Creamer on 11/16/2021 09:08:34 -------------------------------------------------------------------------------- Patient/Caregiver Education Details Patient Name: Date of Service: Tim Vincent, Tim Vincent 9/8/2023andnbsp8:00 A M Medical Record Number: [id number] Patient Account Number: [id number] Date of Birth/Gender: Treating RN: 05-Sep-1955 (66 y.o. Mare Ferrari Primary Care Physician: Raelyn Number Other Clinician: Referring Physician: Treating Physician/Extender: Sammie Bench in Treatment: 0 Education Assessment Education Provided To: Patient Education Topics Provided Elevated Blood Sugar/ Impact on Healing: Handouts: Elevated Blood Sugars: How Do they Affect Wound Healing[place] Spanish Methods: Explain/Verbal, Printed Responses: State content correctly Wound/Skin Impairment: Handouts: Caring for Your Ulcer Spanish, Skin Care Do's and Dont's Spanish Methods: Explain/Verbal, Printed Responses: State content correctly Electronic Signature(s) Signed: 11/16/2021 12:28:23 PM By: Sharyn Creamer RN, BSN Entered By: Sharyn Creamer on 11/16/2021 09:29:42 -------------------------------------------------------------------------------- Wound Assessment Details Patient Name: Date of Service: Tim Vincent, Tim Vincent 11/16/2021 8:00 A M Medical Record Number: [id number] Patient Account Number: [id number] Date of Birth/Sex: Treating RN: 11-28-1955 (66 y.o. Mare Ferrari Primary Care Vinson Tietze: Raelyn Number Other Clinician: Referring Lavel Rieman: Treating Ryley Bachtel/Extender: Sammie Bench in Treatment: 0 Wound Status Wound Number: 1 Primary Etiology: Diabetic Wound/Ulcer of the Lower Extremity Wound Location: Right, Lateral Foot Wound Status: Open Wounding Event: Gradually  Appeared Comorbid History: Hypertension, Type II Diabetes, Gout, Neuropathy Date Acquired: 10/25/2021 Weeks Of Treatment: 0 Clustered Wound: No Hansson, Johnathyn ([id number]) [id number] Page 7 of  9 Photos Wound Measurements Length: (cm) 7.2 Width: (cm) 2 Depth: (cm) 0.2 Area: (cm) 11.31 Volume: (cm) 2.262 % Reduction in Area: 0% % Reduction in Volume: 0% Epithelialization: None Tunneling: No Undermining: No Wound Description Classification: Grade 2 Exudate Amount: Medium Exudate Type: Serosanguineous Exudate Color: red, brown Foul Odor After Cleansing: No Slough/Fibrino Yes Wound Bed Granulation Amount: Small (1-33%) Exposed Structure Granulation Quality: Red, Pink Fascia Exposed: No Necrotic Amount: Large (67-100%) Fat Layer (Subcutaneous Tissue) Exposed: Yes Necrotic Quality: Eschar, Adherent Slough Tendon Exposed: No Muscle Exposed: No Joint Exposed: No Bone Exposed: No Electronic Signature(s) Signed: 11/16/2021 12:28:23 PM By: Sharyn Creamer RN, BSN Entered By: Sharyn Creamer on 11/16/2021 09:07:19 -------------------------------------------------------------------------------- Wound Assessment Details Patient Name: Date of Service: Tim Vincent, Tim Vincent 11/16/2021 8:00 A M Medical Record Number: [id number] Patient Account Number: [id number] Date of Birth/Sex: Treating RN: April 04, 1955 (66 y.o. Mare Ferrari Primary Care Onofre Gains: Raelyn Number Other Clinician: Referring Kohan Azizi: Treating Lawren Sexson/Extender: Sammie Bench in Treatment: 0 Wound Status Wound Number: 2 Primary Etiology: Diabetic Wound/Ulcer of the Lower Extremity Wound Location: Left, Plantar T Great oe Wound Status: Open Wounding Event: Gradually Appeared Comorbid History: Hypertension, Type II Diabetes, Gout, Neuropathy Date Acquired: 10/25/2021 Weeks Of Treatment: 0 Clustered Wound: No Photos Tim Vincent, Tim Vincent ([id number])  [id number] Page 8 of 9 Wound Measurements Length: (cm) 0.5 Width: (cm) 0.5 Depth: (cm) 0.2 Area: (cm) 0.196 Volume: (cm) 0.039 % Reduction in Area: 0% % Reduction in Volume: 0% Epithelialization: None Tunneling: No Undermining: No Wound Description Classification: Grade 2 Exudate Amount: Medium Exudate Type: Serosanguineous Exudate Color: red, brown Foul Odor After Cleansing: No Slough/Fibrino Yes Wound Bed Granulation Amount: Medium (34-66%) Exposed Structure Granulation Quality: Red Fascia Exposed: No Necrotic Amount: Medium (34-66%) Fat Layer (Subcutaneous Tissue) Exposed: Yes Necrotic Quality: Eschar, Adherent Slough Tendon Exposed: No Muscle Exposed: No Joint Exposed: No Bone Exposed: No Electronic Signature(s) Signed: 11/16/2021 12:28:23 PM By: Sharyn Creamer RN, BSN Entered By: Sharyn Creamer on 11/16/2021 09:07:44 -------------------------------------------------------------------------------- Wound Assessment Details Patient Name: Date of Service: Tim Vincent, Tim Vincent 11/16/2021 8:00 A M Medical Record Number: [id number] Patient Account Number: [id number] Date of Birth/Sex: Treating RN: 04/14/1955 (66 y.o. Mare Ferrari Primary Care Meko Masterson: Raelyn Number Other Clinician: Referring Jackelin Correia: Treating Satish Hammers/Extender: Sammie Bench in Treatment: 0 Wound Status Wound Number: 3 Primary Etiology: Diabetic Wound/Ulcer of the Lower Extremity Wound Location: Left Calcaneus Wound Status: Open Wounding Event: Gradually Appeared Comorbid History: Hypertension, Type II Diabetes, Gout, Neuropathy Date Acquired: 10/25/2021 Weeks Of Treatment: 0 Clustered Wound: No Photos Tim Vincent, Tim Vincent ([id number]) [id number] Page 9 of 9 Wound Measurements Length: (cm) 0.7 Width: (cm) 1 Depth: (cm) 0.1 Area: (cm) 0.55 Volume: (cm) 0.055 % Reduction in Area: 0% % Reduction in Volume:  0% Epithelialization: None Tunneling: No Undermining: No Wound Description Classification: Grade 2 Exudate Amount: Medium Exudate Type: Serosanguineous Exudate Color: red, brown Foul Odor After Cleansing: No Slough/Fibrino Yes Wound Bed Granulation Amount: None Present (0%) Exposed Structure Necrotic Amount: Large (67-100%) Fascia Exposed: No Necrotic Quality: Eschar, Adherent Slough Fat Layer (Subcutaneous Tissue) Exposed: Yes Tendon Exposed: No Muscle Exposed: No Joint Exposed: No Bone Exposed: No Electronic Signature(s) Signed: 11/16/2021 12:28:23 PM By: Sharyn Creamer RN, BSN Entered By: Sharyn Creamer on 11/16/2021 09:08:12 -------------------------------------------------------------------------------- Vitals Details Patient Name: Date of Service: Tim Vincent, Tim Vincent 11/16/2021 8:00 A M Medical Record Number: [id number] Patient Account Number: [id number] Date of Birth/Sex: Treating RN: [birth year] (66 y.o. Mare Ferrari Primary Care Jailyne Chieffo: Raelyn Number  Other Clinician: Referring Tadd Holtmeyer: Treating Daniell Mancinas/Extender: Sammie Bench in Treatment: 0 Vital Signs Time Taken: 08:36 Temperature (F): 97.8 Height (in): 67 Pulse (bpm): 62 Source: Stated Respiratory Rate (breaths/min): 16 Weight (lbs): 185 Blood Pressure (mmHg): 184/90 Source: Stated Reference Range: 80 - 120 mg / dl Body Mass Index (BMI): 29 Electronic Signature(s) Signed: 11/16/2021 12:28:23 PM By: Sharyn Creamer RN, BSN Entered By: Sharyn Creamer on 11/16/2021 08:37:53

## 2021-11-16 NOTE — Progress Notes (Signed)
Tim Vincent, HOPFENSPERGER ([id number]) Visit Report for 11/16/2021 Chief Complaint Document Details Patient Name: Date of Service: Tim Vincent 11/16/2021 8:00 A M Medical Record Number: [id number] Patient Account Number: [id number] Date of Birth/Sex: Treating RN: [birth year] (66 y.o. M) Primary Care Provider: Norva Riffle Other Clinician: Referring Provider: Treating Provider/Extender: Sanjuana Kava in Treatment: 0 Information Obtained from: Patient Chief Complaint 11/16/2021; Bilateral foot wounds Electronic Signature(s) Signed: 11/16/2021 3:02:11 PM By: Geralyn Corwin DO Entered By: Geralyn Corwin on 11/16/2021 09:47:45 -------------------------------------------------------------------------------- Debridement Details Patient Name: Date of Service: Tim Vincent, Tim Vincent 11/16/2021 8:00 A M Medical Record Number: [id number] Patient Account Number: [id number] Date of Birth/Sex: Treating RN: [birth year] (66 y.o. Tim Vincent Primary Care Provider: Norva Riffle Other Clinician: Referring Provider: Treating Provider/Extender: Sanjuana Kava in Treatment: 0 Debridement Performed for Assessment: Wound #1 Right,Lateral Foot Performed By: Physician Geralyn Corwin, DO Debridement Type: Debridement Severity of Tissue Pre Debridement: Fat layer exposed Level of Consciousness (Pre-procedure): Awake and Alert Pre-procedure Verification/Time Out Yes - 09:20 Taken: Start Time: 09:24 T Area Debrided (L x W): otal 7.2 (cm) x 2 (cm) = 14.4 (cm) Tissue and other material debrided: Non-Viable, Eschar, Slough, Subcutaneous, Slough Level: Skin/Subcutaneous Tissue Debridement Description: Excisional Instrument: Curette Bleeding: Minimum Hemostasis Achieved: Pressure Procedural Pain: 0 Post Procedural Pain: 0 Response to Treatment: Procedure was tolerated well Level of Consciousness (Post- Awake and Alert procedure): Post Debridement  Measurements of Total Wound Length: (cm) 7.2 Width: (cm) 2 Depth: (cm) 0.2 Volume: (cm) 2.262 Character of Wound/Ulcer Post Debridement: Improved Severity of Tissue Post Debridement: Fat layer exposed Post Procedure Diagnosis Same as Pre-procedure Electronic Signature(s) Signed: 11/16/2021 12:28:23 PM By: Redmond Pulling RN, BSN Signed: 11/16/2021 3:02:11 PM By: Geralyn Corwin DO Entered By: Redmond Pulling on 11/16/2021 09:27:14 -------------------------------------------------------------------------------- Debridement Details Patient Name: Date of Service: Tim Vincent, Tim Vincent 11/16/2021 8:00 A M Medical Record Number: [id number] Patient Account Number: [id number] Date of Birth/Sex: Treating RN: 09-24-1955 (66 y.o. Tim Vincent Primary Care Provider: Norva Riffle Other Clinician: Referring Provider: Treating Provider/Extender: Sanjuana Kava in Treatment: 0 Debridement Performed for Assessment: Wound #2 Left,Plantar T Great oe Performed By: Physician Geralyn Corwin, DO Debridement Type: Debridement Severity of Tissue Pre Debridement: Fat layer exposed Level of Consciousness (Pre-procedure): Awake and Alert Pre-procedure Verification/Time Out Yes - 09:20 Taken: Start Time: 09:24 Pain Control: Lidocaine 5% topical ointment T Area Debrided (L x W): otal 0.5 (cm) x 0.5 (cm) = 0.25 (cm) Tissue and other material debrided: Non-Viable, Callus, Slough, Subcutaneous, Slough Level: Skin/Subcutaneous Tissue Debridement Description: Excisional Instrument: Curette Bleeding: Minimum Hemostasis Achieved: Pressure Procedural Pain: 0 Post Procedural Pain: 0 Response to Treatment: Procedure was tolerated well Level of Consciousness (Post- Awake and Alert procedure): Post Debridement Measurements of Total Wound Length: (cm) 0.5 Width: (cm) 0.5 Depth: (cm) 0.1 Volume: (cm) 0.02 Character of Wound/Ulcer Post Debridement: Improved Severity of Tissue  Post Debridement: Fat layer exposed Post Procedure Diagnosis Same as Pre-procedure Electronic Signature(s) Signed: 11/16/2021 12:28:23 PM By: Redmond Pulling RN, BSN Signed: 11/16/2021 3:02:11 PM By: Geralyn Corwin DO Entered By: Redmond Pulling on 11/16/2021 09:33:02 -------------------------------------------------------------------------------- Debridement Details Patient Name: Date of Service: Tim Vincent, Tim Vincent 11/16/2021 8:00 A M Medical Record Number: [id number] Patient Account Number: [id number] Date of Birth/Sex: Treating RN: [birth year] (66 y.o. Tim Vincent Primary Care Provider: Other Clinician: Norva Riffle Referring Provider: Treating Provider/Extender: Sanjuana Kava in Treatment: 0 Debridement Performed for Assessment: Wound #3 Left Calcaneus Performed  By: Physician Tim Shan, DO Debridement Type: Debridement Severity of Tissue Pre Debridement: Fat layer exposed Level of Consciousness (Pre-procedure): Awake and Alert Pre-procedure Verification/Time Out Yes - 09:20 Taken: Start Time: 09:24 Pain Control: Lidocaine 5% topical ointment T Area Debrided (L x W): otal 0.7 (cm) x 1 (cm) = 0.7 (cm) Tissue and other material debrided: Non-Viable, Slough, Subcutaneous, Slough Level: Skin/Subcutaneous Tissue Debridement Description: Excisional Instrument: Curette Bleeding: Minimum Hemostasis Achieved: Pressure Procedural Pain: 0 Post Procedural Pain: 0 Response to Treatment: Procedure was tolerated well Level of Consciousness (Post- Awake and Alert procedure): Post Debridement Measurements of Total Wound Length: (cm) 0.7 Width: (cm) 1 Depth: (cm) 0.1 Volume: (cm) 0.055 Character of Wound/Ulcer Post Debridement: Improved Severity of Tissue Post Debridement: Fat layer exposed Post Procedure Diagnosis Same as Pre-procedure Electronic Signature(s) Signed: 11/16/2021 12:28:23 PM By: Sharyn Creamer RN, BSN Signed: 11/16/2021 3:02:11  PM By: Tim Shan DO Entered By: Sharyn Creamer on 11/16/2021 09:33:56 -------------------------------------------------------------------------------- HPI Details Patient Name: Date of Service: Tim Vincent, Tim Vincent 11/16/2021 8:00 A M Medical Record Number: [id number] Patient Account Number: [id number] Date of Birth/Sex: Treating RN: 1955/04/08 (66 y.o. M) Primary Care Provider: Raelyn Number Other Clinician: Referring Provider: Treating Provider/Extender: Sammie Bench in Treatment: 0 History of Present Illness HPI Description: Admission 11/16/2021 Mr. Dewitte Krogstad Is a 66 year old male with a past medical history of type 2 diabetes on oral agents without known hemoglobin [id number] complicated by peripheral neuropathy that presents to the clinic for a 1 month history of nonhealing ulcers to his feet bilaterally. He is not sure how this started. He has been using bacitracin and herbal ointments to the wound bed. He saw his primary care provider for this issue and he was prescribed Keflex. He states he is almost done with his 2-week Prescription. He reports some redness to the periwound of the right foot. He denies purulent drainage. Electronic Signature(s) Signed: 11/16/2021 3:02:11 PM By: Tim Shan DO Entered By: Tim Vincent on 11/16/2021 11:44:49 -------------------------------------------------------------------------------- Physical Exam Details Patient Name: Date of Service: LAN, HODGSON 11/16/2021 8:00 A M Medical Record Number: [id number] Patient Account Number: [id number] Date of Birth/Sex: Treating RN: 08-26-1955 (66 y.o. M) Primary Care Provider: Raelyn Number Other Clinician: Referring Provider: Treating Provider/Extender: Sammie Bench in Treatment: 0 Constitutional respirations regular, non-labored and within target range for patient.. Cardiovascular 2+ dorsalis pedis/posterior tibialis  pulses. Psychiatric pleasant and cooperative. Notes Right foot: T the lateral aspect there is an elongated wound with granulation tissue and nonviable tissue. T the surrounding soft tissue there is increased o o warmth and erythema. Left foot: T the plantar aspect of the great toe there is an open wound with granulation tissue and nonviable tissue o T the calcaneus there is an open wound with granulation tissue and nonviable tissue. No signs of infection to the left foot wounds o Electronic Signature(s) Signed: 11/16/2021 3:02:11 PM By: Tim Shan DO Entered By: Tim Vincent on 11/16/2021 11:46:58 -------------------------------------------------------------------------------- Physician Orders Details Patient Name: Date of Service: Tim Vincent, Tim Vincent 11/16/2021 8:00 A M Medical Record Number: [id number] Patient Account Number: [id number] Date of Birth/Sex: Treating RN: 03-22-1955 (66 y.o. Mare Ferrari Primary Care Provider: Raelyn Number Other Clinician: Referring Provider: Treating Provider/Extender: Sammie Bench in Treatment: 0 Verbal / Phone Orders: No Diagnosis Coding ICD-10 Coding Code Description [phone] Non-pressure chronic ulcer of other part of right foot with fat layer exposed L97.522 Non-pressure chronic ulcer of other part of left foot  with fat layer exposed E11.621 Type 2 diabetes mellitus with foot ulcer E11.42 Type 2 diabetes mellitus with diabetic polyneuropathy Follow-up Appointments ppointment in 1 week. - Dr. Heber [place] - [place] Return A Tuesday 11/27/21 @ 12:30 ppointment in: - Dr. Heber [place] - [place] Return A Thursday 11/29/21 @ 12:30 Anesthetic Wound #1 Right,Lateral Foot (In clinic) Topical Lidocaine 5% applied to wound bed Wound #2 Left,Plantar T Great oe (In clinic) Topical Lidocaine 5% applied to wound bed Wound #3 Left Calcaneus (In clinic) Topical Lidocaine 5% applied to wound bed Bathing/ Shower/ Hygiene May  shower and wash wound with soap and water. - wash with Dial antibacterial (gold) soap prior to dressing change Edema Control - Lymphedema / SCD / Other Bilateral Lower Extremities Elevate legs to the level of the heart or above for 30 minutes daily and/or when sitting, a frequency of: Avoid standing for long periods of time. Off-Loading Wound #3 Left Calcaneus Wedge shoe to: - left foot Additional Orders / Instructions Follow Nutritious Diet - increase protein intake to encourage wound healing, decrease sugar/carbohydrate intake Other: - May purchase medihoney from [facility] or pick up at [facility] #1 - Foot Wound Laterality: Right, Lateral Cleanser: Wound Cleanser Discharge Instructions: Cleanse the wound with wound cleanser prior to applying a clean dressing using gauze sponges, not tissue or cotton balls. Prim Dressing: MediHoney Gel, tube 1.5 (oz) ary Discharge Instructions: Apply to wound bed as instructed Secondary Dressing: ABD Pad, 5x9 Discharge Instructions: Apply over primary dressing as directed. Secondary Dressing: Optifoam Non-Adhesive Dressing, 4x4 in Discharge Instructions: Apply over primary dressing as directed. Secured With: [employer], 4.5x3.1 (in/yd) Discharge Instructions: Secure with Kerlix as directed. Secured With: 67M Med[employer] Public affairs consultant Surgical T 2x10 (in/yd) ape Discharge Instructions: Secure with tape as directed. Wound #2 - T Great oe Wound Laterality: Plantar, Left Cleanser: Wound Cleanser Discharge Instructions: Cleanse the wound with wound cleanser prior to applying a clean dressing using gauze sponges, not tissue or cotton balls. Prim Dressing: MediHoney Gel, tube 1.5 (oz) ary Discharge Instructions: Apply to wound bed as instructed Secondary Dressing: Woven Gauze Sponge, Non-Sterile 4x4 in Discharge Instructions: Apply over primary dressing as directed. Secured With: Child psychotherapist,  Sterile 2x75 (in/in) Discharge Instructions: Secure with stretch gauze as directed. Secured With: 67M [employer] Public affairs consultant Surgical T 2x10 (in/yd) ape Discharge Instructions: Secure with tape as directed. Wound #3 - Calcaneus Wound Laterality: Left Cleanser: Wound Cleanser Discharge Instructions: Cleanse the wound with wound cleanser prior to applying a clean dressing using gauze sponges, not tissue or cotton balls. Prim Dressing: MediHoney Gel, tube 1.5 (oz) ary Discharge Instructions: Apply to wound bed as instructed Secondary Dressing: ALLEVYN Heel 4 1/2in x 5 1/2in / 10.5cm x 13.5cm Discharge Instructions: Apply over primary dressing as directed. Secondary Dressing: Woven Gauze Sponge, Non-Sterile 4x4 in Discharge Instructions: Apply over primary dressing as directed. Secured With: [employer], 4.5x3.1 (in/yd) Discharge Instructions: Secure with Kerlix as directed. Secured With: 67M [employer] Public affairs consultant Surgical T 2x10 (in/yd) ape Discharge Instructions: Secure with tape as directed. Services and Therapies Venous Studies -Bilateral - Bilateral ABI/TBI for treatment of diabetic foot ulcers CPT 93922 - (ICD10 E11.621 - Type 2 diabetes mellitus with foot : ulcer) Electronic Signature(s) Signed: 11/16/2021 12:28:23 PM By: Sharyn Creamer RN, BSN Signed: 11/16/2021 3:02:11 PM By: Tim Shan DO Previous Signature: 11/16/2021 9:46:45 AM Version By: Tim Shan DO Entered By: Sharyn Creamer on 11/16/2021 12:10:31 Prescription 11/16/2021 -------------------------------------------------------------------------------- Cyndia Skeeters  DO Patient Name: Provider: 19-Oct-1955 [id number] Date of Birth: NPI#: Jerilynn Mages [id number] Sex: DEA #: [id number] Phone #: License #: [place] Patient Address: [address], [place] [address] [place], [place] [address] [phone] Allergies No  Known Drug Allergies Provider's Orders Venous Studies -Bilateral - ICD10: E11.621 - Bilateral ABI/TBI for treatment of diabetic foot ulcers CPT 93922 : Hand Signature: Date(s): Electronic Signature(s) Signed: 11/16/2021 12:28:23 PM By: Sharyn Creamer RN, BSN Signed: 11/16/2021 3:02:11 PM By: Tim Shan DO Entered By: Sharyn Creamer on 11/16/2021 12:10:32 -------------------------------------------------------------------------------- Problem List Details Patient Name: Date of Service: Tim Vincent, Tim Vincent 11/16/2021 8:00 A M Medical Record Number: [id number] Patient Account Number: [id number] Date of Birth/Sex: Treating RN: 1955/09/25 (66 y.o. M) Primary Care Provider: Raelyn Number Other Clinician: Referring Provider: Treating Provider/Extender: Sammie Bench in Treatment: 0 Active Problems ICD-10 Encounter Code Description Active Date MDM Diagnosis L97.512 Non-pressure chronic ulcer of other part of right foot with fat layer exposed 11/16/2021 No Yes L97.522 Non-pressure chronic ulcer of other part of left foot with fat layer exposed 11/16/2021 No Yes E11.621 Type 2 diabetes mellitus with foot ulcer 11/16/2021 No Yes E11.42 Type 2 diabetes mellitus with diabetic polyneuropathy 11/16/2021 No Yes Inactive Problems Resolved Problems Electronic Signature(s) Signed: 11/16/2021 3:02:11 PM By: Tim Shan DO Entered By: Tim Vincent on 11/16/2021 09:47:13 -------------------------------------------------------------------------------- Progress Note Details Patient Name: Date of Service: Tim Vincent, Adron 11/16/2021 8:00 A M Medical Record Number: [id number] Patient Account Number: [id number] Date of Birth/Sex: Treating RN: [birth year] (66 y.o. M) Primary Care Provider: Raelyn Number Other Clinician: Referring Provider: Treating Provider/Extender: Sammie Bench in Treatment: 0 Subjective Chief Complaint Information  obtained from Patient 11/16/2021; Bilateral foot wounds History of Present Illness (HPI) Admission 11/16/2021 Mr. Kartik Sargsyan Is a 66 year old male with a past medical history of type 2 diabetes on oral agents without known hemoglobin 123456 complicated by peripheral neuropathy that presents to the clinic for a 1 month history of nonhealing ulcers to his feet bilaterally. He is not sure how this started. He has been using bacitracin and herbal ointments to the wound bed. He saw his primary care provider for this issue and he was prescribed Keflex. He states he is almost done with his 2-week Prescription. He reports some redness to the periwound of the right foot. He denies purulent drainage. Patient History Allergies No Known Drug Allergies Family History Cancer - Siblings, Diabetes - Mother, Hypertension - Mother, Stroke - Father, No family history of Kidney Disease, Lung Disease. Social History Former smoker - quit 30 yrs ago, Marital Status - Married, Alcohol Use - Daily - 2-3 beers per week, Drug Use - No History, Caffeine Use - Daily. Medical History Cardiovascular Patient has history of Hypertension Endocrine Patient has history of Type II Diabetes Musculoskeletal Patient has history of Gout Neurologic Patient has history of Neuropathy Patient is treated with Oral Agents. Blood sugar is not tested. Medical A Surgical History Notes nd Cardiovascular hyperlipidemia Genitourinary CKD stage III Review of Systems (ROS) Constitutional Symptoms (General Health) Denies complaints or symptoms of Fatigue, Fever, Chills, Marked Weight Change. Eyes Complains or has symptoms of Glasses / Contacts - reading glasses. Ear/Nose/Mouth/Throat Denies complaints or symptoms of Chronic sinus problems or rhinitis. Respiratory Denies complaints or symptoms of Chronic or frequent coughs, Shortness of Breath. Gastrointestinal Denies complaints or symptoms of Frequent diarrhea, Nausea,  Vomiting. Genitourinary Denies complaints or symptoms of  Frequent urination. Integumentary (Skin) Complains or has symptoms of Wounds - bilateral feet. Musculoskeletal Denies complaints or symptoms of Muscle Pain, Muscle Weakness. Objective Constitutional respirations regular, non-labored and within target range for patient.. Vitals Time Taken: 8:36 AM, Height: 67 in, Source: Stated, Weight: 185 lbs, Source: Stated, BMI: 29, Temperature: 97.8 F, Pulse: 62 bpm, Respiratory Rate: 16 breaths/min, Blood Pressure: 184/90 mmHg. Cardiovascular 2+ dorsalis pedis/posterior tibialis pulses. Psychiatric pleasant and cooperative. General Notes: Right foot: T the lateral aspect there is an elongated wound with granulation tissue and nonviable tissue. T the surrounding soft tissue there is o o increased warmth and erythema. Left foot: T the plantar aspect of the great toe there is an open wound with granulation tissue and nonviable tissue T the o o calcaneus there is an open wound with granulation tissue and nonviable tissue. No signs of infection to the left foot wounds Integumentary (Hair, Skin) Wound #1 status is Open. Original cause of wound was Gradually Appeared. The date acquired was: 10/25/2021. The wound is located on the Right,Lateral Foot. The wound measures 7.2cm length x 2cm width x 0.2cm depth; 11.31cm^2 area and 2.262cm^3 volume. There is Fat Layer (Subcutaneous Tissue) exposed. There is no tunneling or undermining noted. There is a medium amount of serosanguineous drainage noted. There is small (1-33%) red, pink granulation within the wound bed. There is a large (67-100%) amount of necrotic tissue within the wound bed including Eschar and Adherent Slough. Wound #2 status is Open. Original cause of wound was Gradually Appeared. The date acquired was: 10/25/2021. The wound is located on the [employer]. The wound measures 0.5cm length x 0.5cm width x 0.2cm depth; 0.196cm^2 area  and 0.039cm^3 volume. There is Fat Layer (Subcutaneous Tissue) exposed. There is no tunneling or undermining noted. There is a medium amount of serosanguineous drainage noted. There is medium (34-66%) red granulation within the wound bed. There is a medium (34-66%) amount of necrotic tissue within the wound bed including Eschar and Adherent Slough. Wound #3 status is Open. Original cause of wound was Gradually Appeared. The date acquired was: 10/25/2021. The wound is located on the Left Calcaneus. The wound measures 0.7cm length x 1cm width x 0.1cm depth; 0.55cm^2 area and 0.055cm^3 volume. There is Fat Layer (Subcutaneous Tissue) exposed. There is no tunneling or undermining noted. There is a medium amount of serosanguineous drainage noted. There is no granulation within the wound bed. There is a large (67-100%) amount of necrotic tissue within the wound bed including Eschar and Adherent Slough. Assessment Active Problems ICD-10 Non-pressure chronic ulcer of other part of right foot with fat layer exposed Non-pressure chronic ulcer of other part of left foot with fat layer exposed Type 2 diabetes mellitus with foot ulcer Type 2 diabetes mellitus with diabetic polyneuropathy Patient presents with a 1 month history of nonhealing ulcers to his feet bilaterally in the setting of diabetes. I cannot see his last hemoglobin A1c but was told he does not take his medications as prescribed for his diabetes. He does have peripheral neuropathy. We had a long discussion about the importance of glycemic control in wound healing. We also discussed how diabetics are at high risk for infection and thus amputations. He expressed understanding. We also talked about the importance of pressure relief to these wound beds to help with healing. I recommended a heel offloading shoe to the left foot and padding with a surgical shoe to the right foot. Since he has some redness around the right foot wound  I will prescribe him  a course of Augmentin. He has a few days left of Keflex and I asked him to stop this. ABIs in office were noncompressible And I recommended ABIs with TBI's. He does have strong pedal pulses. I debrided nonviable tissue and recommended doing Medihoney. We will see him back in 1 to 2 weeks. He would benefit from a total contact cast in the near future. We discussed this today and he is agreeable to do this. Procedures Wound #1 Pre-procedure diagnosis of Wound #1 is a Diabetic Wound/Ulcer of the Lower Extremity located on the Right,Lateral Foot .Severity of Tissue Pre Debridement is: Fat layer exposed. There was a Excisional Skin/Subcutaneous Tissue Debridement with a total area of 14.4 sq cm performed by Tim Shan, DO. With the following instrument(s): Curette to remove Non-Viable tissue/material. Material removed includes Eschar, Subcutaneous Tissue, and Slough. No specimens were taken. A time out was conducted at 09:20, prior to the start of the procedure. A Minimum amount of bleeding was controlled with Pressure. The procedure was tolerated well with a pain level of 0 throughout and a pain level of 0 following the procedure. Post Debridement Measurements: 7.2cm length x 2cm width x 0.2cm depth; 2.262cm^3 volume. Character of Wound/Ulcer Post Debridement is improved. Severity of Tissue Post Debridement is: Fat layer exposed. Post procedure Diagnosis Wound #1: Same as Pre-Procedure Wound #2 Pre-procedure diagnosis of Wound #2 is a Diabetic Wound/Ulcer of the Lower Extremity located on the [place] .Severity of Tissue Pre oe Debridement is: Fat layer exposed. There was a Excisional Skin/Subcutaneous Tissue Debridement with a total area of 0.25 sq cm performed by Tim Shan, DO. With the following instrument(s): Curette to remove Non-Viable tissue/material. Material removed includes Callus, Subcutaneous Tissue, and Slough after achieving pain control using Lidocaine 5%  topical ointment. No specimens were taken. A time out was conducted at 09:20, prior to the start of the procedure. A Minimum amount of bleeding was controlled with Pressure. The procedure was tolerated well with a pain level of 0 throughout and a pain level of 0 following the procedure. Post Debridement Measurements: 0.5cm length x 0.5cm width x 0.1cm depth; 0.02cm^3 volume. Character of Wound/Ulcer Post Debridement is improved. Severity of Tissue Post Debridement is: Fat layer exposed. Post procedure Diagnosis Wound #2: Same as Pre-Procedure Wound #3 Pre-procedure diagnosis of Wound #3 is a Diabetic Wound/Ulcer of the Lower Extremity located on the Left Calcaneus .Severity of Tissue Pre Debridement is: Fat layer exposed. There was a Excisional Skin/Subcutaneous Tissue Debridement with a total area of 0.7 sq cm performed by Tim Shan, DO. With the following instrument(s): Curette to remove Non-Viable tissue/material. Material removed includes Subcutaneous Tissue and Slough and after achieving pain control using Lidocaine 5% topical ointment. No specimens were taken. A time out was conducted at 09:20, prior to the start of the procedure. A Minimum amount of bleeding was controlled with Pressure. The procedure was tolerated well with a pain level of 0 throughout and a pain level of 0 following the procedure. Post Debridement Measurements: 0.7cm length x 1cm width x 0.1cm depth; 0.055cm^3 volume. Character of Wound/Ulcer Post Debridement is improved. Severity of Tissue Post Debridement is: Fat layer exposed. Post procedure Diagnosis Wound #3: Same as Pre-Procedure Plan Follow-up Appointments: Return Appointment in 1 week. - Dr. Heber [place] - [place] Tuesday 11/27/21 @ 12:30 Return Appointment in: - Dr. Heber [place] - [place] Thursday 11/29/21 @ 12:30 Anesthetic: Wound #1 Right,Lateral Foot: (In clinic) Topical Lidocaine 5% applied to wound bed  Wound #2 Left,Plantar T Great: oe (In clinic) Topical  Lidocaine 5% applied to wound bed Wound #3 Left Calcaneus: (In clinic) Topical Lidocaine 5% applied to wound bed Bathing/ Shower/ Hygiene: May shower and wash wound with soap and water. - wash with Dial antibacterial (gold) soap prior to dressing change Edema Control - Lymphedema / SCD / Other: Elevate legs to the level of the heart or above for 30 minutes daily and/or when sitting, a frequency of: Avoid standing for long periods of time. Off-Loading: Wound #3 Left Calcaneus: Wedge shoe to: - left foot Additional Orders / Instructions: Follow Nutritious Diet - increase protein intake to encourage wound healing, decrease sugar/carbohydrate intake Other: - May purchase medihoney from [place] or pick up at [facility] WOUND #1: - Foot Wound Laterality: Right, Lateral Cleanser: Wound Cleanser Discharge Instructions: Cleanse the wound with wound cleanser prior to applying a clean dressing using gauze sponges, not tissue or cotton balls. Prim Dressing: MediHoney Gel, tube 1.5 (oz) ary Discharge Instructions: Apply to wound bed as instructed Secondary Dressing: ABD Pad, 5x9 Discharge Instructions: Apply over primary dressing as directed. Secondary Dressing: Optifoam Non-Adhesive Dressing, 4x4 in Discharge Instructions: Apply over primary dressing as directed. Secured With: [employer], 4.5x3.1 (in/yd) Discharge Instructions: Secure with Kerlix as directed. Secured With: 47M Medipore Scientist, research (life sciences) Surgical T 2x10 (in/yd) ape Discharge Instructions: Secure with tape as directed. WOUND #2: - T Great Wound Laterality: Plantar, Left oe Cleanser: Wound Cleanser Discharge Instructions: Cleanse the wound with wound cleanser prior to applying a clean dressing using gauze sponges, not tissue or cotton balls. Prim Dressing: MediHoney Gel, tube 1.5 (oz) ary Discharge Instructions: Apply to wound bed as instructed Secondary Dressing: Woven Gauze Sponge, Non-Sterile 4x4 in Discharge  Instructions: Apply over primary dressing as directed. Secured With: Insurance underwriter, Sterile 2x75 (in/in) Discharge Instructions: Secure with stretch gauze as directed. Secured With: 47M Medipore Scientist, research (life sciences) Surgical T 2x10 (in/yd) ape Discharge Instructions: Secure with tape as directed. WOUND #3: - Calcaneus Wound Laterality: Left Cleanser: Wound Cleanser Discharge Instructions: Cleanse the wound with wound cleanser prior to applying a clean dressing using gauze sponges, not tissue or cotton balls. Prim Dressing: MediHoney Gel, tube 1.5 (oz) ary Discharge Instructions: Apply to wound bed as instructed Secondary Dressing: ALLEVYN Heel 4 1/2in x 5 1/2in / 10.5cm x 13.5cm Discharge Instructions: Apply over primary dressing as directed. Secondary Dressing: Woven Gauze Sponge, Non-Sterile 4x4 in Discharge Instructions: Apply over primary dressing as directed. Secured With: [employer], 4.5x3.1 (in/yd) Discharge Instructions: Secure with Kerlix as directed. Secured With: 47M Medipore Scientist, research (life sciences) Surgical T 2x10 (in/yd) ape Discharge Instructions: Secure with tape as directed. 1. In office sharp debridement 2. ABIs with TBI's 3. Medihoney 4. Augmentin 5. Follow-up in 1 week Electronic Signature(s) Signed: 11/16/2021 3:02:11 PM By: Geralyn Corwin DO Entered By: Geralyn Corwin on 11/16/2021 11:58:51 -------------------------------------------------------------------------------- HxROS Details Patient Name: Date of Service: Tim Vincent, Rahshawn 11/16/2021 8:00 A M Medical Record Number: [id number] Patient Account Number: [id number] Date of Birth/Sex: Treating RN: [birth year] (66 y.o. Tim Vincent Primary Care Provider: Norva Riffle Other Clinician: Referring Provider: Treating Provider/Extender: Sanjuana Kava in Treatment: 0 Constitutional Symptoms (General Health) Complaints and Symptoms: Negative for: Fatigue; Fever; Chills;  Marked Weight Change Eyes Complaints and Symptoms: Positive for: Glasses / Contacts - reading glasses Ear/Nose/Mouth/Throat Complaints and Symptoms: Negative for: Chronic sinus problems or rhinitis Respiratory Complaints and Symptoms: Negative for: Chronic or frequent coughs; Shortness of Breath  Gastrointestinal Complaints and Symptoms: Negative for: Frequent diarrhea; Nausea; Vomiting Genitourinary Complaints and Symptoms: Negative for: Frequent urination Medical History: Past Medical History Notes: CKD stage III Integumentary (Skin) Complaints and Symptoms: Positive for: Wounds - bilateral feet Musculoskeletal Complaints and Symptoms: Negative for: Muscle Pain; Muscle Weakness Medical History: Positive for: Gout Hematologic/Lymphatic Cardiovascular Medical History: Positive for: Hypertension Past Medical History Notes: hyperlipidemia Endocrine Medical History: Positive for: Type II Diabetes Time with diabetes: 7yrs Treated with: Oral agents Blood sugar tested every day: No Immunological Neurologic Medical History: Positive for: Neuropathy Oncologic Immunizations Pneumococcal Vaccine: Received Pneumococcal Vaccination: No Implantable Devices None Family and Social History Cancer: Yes - Siblings; Diabetes: Yes - Mother; Hypertension: Yes - Mother; Kidney Disease: No; Lung Disease: No; Stroke: Yes - Father; Former smoker - quit 30 yrs ago; Marital Status - Married; Alcohol Use: Daily - 2-3 beers per week; Drug Use: No History; Caffeine Use: Daily; Financial Concerns: No; Food, Clothing or Shelter Needs: No; Support System Lacking: No; Transportation Concerns: No Electronic Signature(s) Signed: 11/16/2021 12:28:23 PM By: Sharyn Creamer RN, BSN Signed: 11/16/2021 3:02:11 PM By: Tim Shan DO Entered By: Sharyn Creamer on 11/16/2021 08:46:59 -------------------------------------------------------------------------------- [place] Details Patient Name: Date  of Service: Tim Vincent, Marvell 11/16/2021 Medical Record Number: [id number] Patient Account Number: [id number] Date of Birth/Sex: Treating RN: Aug 25, 1955 (66 y.o. 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 E11.621 Type 2 diabetes mellitus with foot ulcer E11.42 Type 2 diabetes mellitus with diabetic polyneuropathy Facility Procedures CPT4 Code: [id number] Description: [place] VISIT-LEV 4 EST PT Modifier: 25 Quantity: 1 CPT4 Code: [id number] Description: [place] - DEB SUBQ TISSUE 20 SQ CM/< ICD-10 Diagnosis Description L97.512 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 Modifier: Quantity: 1 Physician Procedures : CPT4 Code Description Modifier [id number] - WC PHYS LEVEL 4 - NEW PT ICD-10 Diagnosis Description L97.512 Non-pressure chronic ulcer of other part of right foot with fat layer exposed L97.522 Non-pressure chronic ulcer of other part of left foot  with fat layer exposed E11.621 Type 2 diabetes mellitus with foot ulcer E11.42 Type 2 diabetes mellitus with diabetic polyneuropathy Quantity: 1 : [id number] - WC PHYS SUBQ TISS 20 SQ CM ICD-10 Diagnosis Description L97.512 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

## 2021-11-16 NOTE — Progress Notes (Signed)
ARGENIS, KUMARI ([id number]) Visit Report for 11/16/2021 Abuse Risk Screen Details Patient Name: Date of Service: NUR, KRASINSKI 11/16/2021 8:00 A M Medical Record Number: [id number] Patient Account Number: [id number] Date of Birth/Sex: Treating RN: [birth year] (66 y.o. Cline Cools Primary Care Ilda Laskin: Norva Riffle Other Clinician: Referring Dorsey Authement: Treating Shanise Balch/Extender: Sanjuana Kava in Treatment: 0 Abuse Risk Screen Items Answer ABUSE RISK SCREEN: Has anyone close to you tried to hurt or harm you recentlyo No Do you feel uncomfortable with anyone in your familyo No Has anyone forced you do things that you didnt want to doo No Electronic Signature(s) Signed: 11/16/2021 12:28:23 PM By: Redmond Pulling RN, BSN Entered By: Redmond Pulling on 11/16/2021 08:47:12 -------------------------------------------------------------------------------- Activities of Daily Living Details Patient Name: Date of Service: NICLAS, MARKELL 11/16/2021 8:00 A M Medical Record Number: [id number] Patient Account Number: [id number] Date of Birth/Sex: Treating RN: 1955-06-05 (67 y.o. Cline Cools Primary Care Roald Lukacs: Norva Riffle Other Clinician: Referring Leafy Motsinger: Treating Luretta Everly/Extender: Sanjuana Kava in Treatment: 0 Activities of Daily Living Items Answer Activities of Daily Living (Please select one for each item) Drive Automobile Completely Able T Medications ake Completely Able Use T elephone Completely Able Care for Appearance Completely Able Use T oilet Completely Able Bath / Shower Completely Able Dress Self Completely Able Feed Self Completely Able Walk Completely Able Get In / Out Bed Completely Able Housework Completely Able Prepare Meals Completely Able Handle Money Completely Able Shop for Self Completely Able Electronic Signature(s) Signed: 11/16/2021 12:28:23 PM By: Redmond Pulling RN, BSN Entered By:  Redmond Pulling on 11/16/2021 08:47:44 -------------------------------------------------------------------------------- Education Screening Details Patient Name: Date of Service: Ree Shay, [place] 11/16/2021 8:00 A M Medical Record Number: [id number] Patient Account Number: [id number] Date of Birth/Sex: Treating RN: November 17, 1955 (66 y.o. Cline Cools Primary Care Omarie Parcell: Norva Riffle Other Clinician: Referring Gerardo Caiazzo: Treating Io Dieujuste/Extender: Sanjuana Kava in Treatment: 0 Learning Preferences/Education Level/Primary Language Learning Preference: Explanation, Demonstration, Printed Material Preferred Language: Spanish; Castilian Cognitive Barrier Language Barrier: Yes spanish, needs interpreter Translator Needed: Yes Hospital Employed Language Interpreter Memory Deficit: No Emotional Barrier: No Cultural/Religious Beliefs Affecting Medical Care: No Physical Barrier Impaired Vision: No Impaired Hearing: No Decreased Hand dexterity: No Knowledge/Comprehension Knowledge Level: Low Comprehension Level: Medium Ability to understand written instructions: Medium Ability to understand verbal instructions: Medium Motivation Anxiety Level: Calm Cooperation: Cooperative Education Importance: Acknowledges Need Interest in Health Problems: Asks Questions Perception: Coherent Willingness to Engage in Self-Management High Activities: Readiness to Engage in Self-Management High Activities: Electronic Signature(s) Signed: 11/16/2021 12:28:23 PM By: Redmond Pulling RN, BSN Entered By: Redmond Pulling on 11/16/2021 08:49:01 -------------------------------------------------------------------------------- Fall Risk Assessment Details Patient Name: Date of Service: Ree Shay, Corney 11/16/2021 8:00 A M Medical Record Number: [id number] Patient Account Number: [id number] Date of Birth/Sex: Treating RN: 1955-06-14 (66 y.o. Cline Cools Primary Care  Gae Bihl: Norva Riffle Other Clinician: Referring Kortlynn Poust: Treating Fareeha Evon/Extender: Sanjuana Kava in Treatment: 0 Fall Risk Assessment Items Have you had 2 or more falls in the last 12 monthso 0 No Have you had any fall that resulted in injury in the last 12 monthso 0 No FALLS RISK SCREEN History of falling - immediate or within 3 months 0 No Secondary diagnosis (Do you have 2 or more medical diagnoseso) 15 Yes Ambulatory aid None/bed rest/wheelchair/nurse 0 No Crutches/cane/walker 0 No Furniture 0 No Intravenous therapy Access/Saline/Heparin Lock 0 No Gait/Transferring Normal/ bed rest/ wheelchair 0 No Weak (short  steps with or without shuffle, stooped but able to lift head while walking, may seek 0 No support from furniture) Impaired (short steps with shuffle, may have difficulty arising from chair, head down, impaired 0 No balance) Mental Status Oriented to own ability 0 No Electronic Signature(s) Signed: 11/16/2021 12:28:23 PM By: Redmond Pulling RN, BSN Entered By: Redmond Pulling on 11/16/2021 08:49:19 -------------------------------------------------------------------------------- Foot Assessment Details Patient Name: Date of Service: Ree Shay, [place] 11/16/2021 8:00 A M Medical Record Number: [id number] Patient Account Number: [id number] Date of Birth/Sex: Treating RN: Jul 30, 1955 (66 y.o. Cline Cools Primary Care Leone Putman: Norva Riffle Other Clinician: Referring Laquida Cotrell: Treating Juliza Machnik/Extender: Sanjuana Kava in Treatment: 0 Foot Assessment Items Site Locations + = Sensation present, - = Sensation absent, C = Callus, U = Ulcer R = Redness, W = Warmth, M = Maceration, PU = Pre-ulcerative lesion F = Fissure, S = Swelling, D = Dryness Assessment Right: Left: Other Deformity: No No Prior Foot Ulcer: No No Prior Amputation: No No Charcot Joint: No No Ambulatory Status: Gait: Electronic  Signature(s) Signed: 11/16/2021 12:28:23 PM By: Redmond Pulling RN, BSN Entered By: Redmond Pulling on 11/16/2021 09:00:17 -------------------------------------------------------------------------------- Nutrition Risk Screening Details Patient Name: Date of Service: Ree Shay, [place] 11/16/2021 8:00 A M Medical Record Number: [id number] Patient Account Number: [id number] Date of Birth/Sex: Treating RN: 01/29/1956 (66 y.o. Cline Cools Primary Care Brigido Mera: Norva Riffle Other Clinician: Referring Marvie Brevik: Treating Nidal Rivet/Extender: Sanjuana Kava in Treatment: 0 Height (in): 67 Weight (lbs): 185 Body Mass Index (BMI): 29 Nutrition Risk Screening Items Score Screening NUTRITION RISK SCREEN: I have an illness or condition that made me change the kind and/or amount of food I eat 0 No I eat fewer than two meals per day 0 No I eat few fruits and vegetables, or milk products 0 No I have three or more drinks of beer, liquor or wine almost every day 0 No I have tooth or mouth problems that make it hard for me to eat 0 No I don't always have enough money to buy the food I need 0 No I eat alone most of the time 0 No I take three or more different prescribed or over-the-counter drugs a day 1 Yes Without wanting to, I have lost or gained 10 pounds in the last six months 0 No I am not always physically able to shop, cook and/or feed myself 0 No Nutrition Protocols Good Risk Protocol Moderate Risk Protocol High Risk Proctocol Risk Level: Good Risk Score: 1 Electronic Signature(s) Signed: 11/16/2021 12:28:23 PM By: Redmond Pulling RN, BSN Entered By: Redmond Pulling on 11/16/2021 08:49:41

## 2021-11-27 ENCOUNTER — Encounter (HOSPITAL_BASED_OUTPATIENT_CLINIC_OR_DEPARTMENT_OTHER): Payer: Medicare Other | Admitting: Internal Medicine

## 2021-11-29 ENCOUNTER — Ambulatory Visit (HOSPITAL_BASED_OUTPATIENT_CLINIC_OR_DEPARTMENT_OTHER): Payer: Medicare Other | Admitting: Internal Medicine

## 2021-12-03 ENCOUNTER — Other Ambulatory Visit (HOSPITAL_COMMUNITY): Payer: Self-pay | Admitting: Internal Medicine

## 2021-12-03 DIAGNOSIS — E11621 Type 2 diabetes mellitus with foot ulcer: Secondary | ICD-10-CM

## 2021-12-05 ENCOUNTER — Ambulatory Visit (HOSPITAL_COMMUNITY): Admission: RE | Admit: 2021-12-05 | Payer: Medicare Other | Source: Ambulatory Visit

## 2021-12-25 ENCOUNTER — Ambulatory Visit (HOSPITAL_COMMUNITY): Payer: Medicare Other | Attending: Internal Medicine

## 2021-12-25 ENCOUNTER — Encounter (HOSPITAL_COMMUNITY): Payer: Self-pay

## 2021-12-27 ENCOUNTER — Encounter (HOSPITAL_BASED_OUTPATIENT_CLINIC_OR_DEPARTMENT_OTHER): Payer: Medicare Other | Attending: Internal Medicine | Admitting: Internal Medicine

## 2022-02-22 DIAGNOSIS — E1159 Type 2 diabetes mellitus with other circulatory complications: Secondary | ICD-10-CM | POA: Diagnosis not present

## 2022-02-22 DIAGNOSIS — E118 Type 2 diabetes mellitus with unspecified complications: Secondary | ICD-10-CM | POA: Diagnosis not present

## 2022-02-22 DIAGNOSIS — R2233 Localized swelling, mass and lump, upper limb, bilateral: Secondary | ICD-10-CM | POA: Diagnosis not present

## 2022-02-22 DIAGNOSIS — R3912 Poor urinary stream: Secondary | ICD-10-CM | POA: Diagnosis not present

## 2022-02-22 DIAGNOSIS — I152 Hypertension secondary to endocrine disorders: Secondary | ICD-10-CM | POA: Diagnosis not present

## 2022-02-22 DIAGNOSIS — Z0000 Encounter for general adult medical examination without abnormal findings: Secondary | ICD-10-CM | POA: Diagnosis not present

## 2022-03-12 DIAGNOSIS — E782 Mixed hyperlipidemia: Secondary | ICD-10-CM | POA: Diagnosis not present

## 2022-03-12 DIAGNOSIS — E1159 Type 2 diabetes mellitus with other circulatory complications: Secondary | ICD-10-CM | POA: Diagnosis not present

## 2022-03-12 DIAGNOSIS — I152 Hypertension secondary to endocrine disorders: Secondary | ICD-10-CM | POA: Diagnosis not present

## 2022-03-13 DIAGNOSIS — R8290 Unspecified abnormal findings in urine: Secondary | ICD-10-CM | POA: Diagnosis not present

## 2022-03-13 DIAGNOSIS — R3129 Other microscopic hematuria: Secondary | ICD-10-CM | POA: Diagnosis not present

## 2022-04-10 ENCOUNTER — Institutional Professional Consult (permissible substitution): Payer: Medicare Other | Admitting: Plastic Surgery

## 2022-04-16 ENCOUNTER — Inpatient Hospital Stay (HOSPITAL_COMMUNITY)
Admission: EM | Admit: 2022-04-16 | Discharge: 2022-04-21 | DRG: 239 | Disposition: A | Discharge status: Home Health | Payer: 59 | Attending: Internal Medicine | Admitting: Internal Medicine

## 2022-04-16 ENCOUNTER — Emergency Department (HOSPITAL_COMMUNITY): Payer: 59

## 2022-04-16 ENCOUNTER — Other Ambulatory Visit: Payer: Self-pay

## 2022-04-16 DIAGNOSIS — E663 Overweight: Secondary | ICD-10-CM | POA: Diagnosis present

## 2022-04-16 DIAGNOSIS — M726 Necrotizing fasciitis: Secondary | ICD-10-CM

## 2022-04-16 DIAGNOSIS — E875 Hyperkalemia: Secondary | ICD-10-CM | POA: Diagnosis present

## 2022-04-16 DIAGNOSIS — M109 Gout, unspecified: Secondary | ICD-10-CM | POA: Diagnosis not present

## 2022-04-16 DIAGNOSIS — I129 Hypertensive chronic kidney disease with stage 1 through stage 4 chronic kidney disease, or unspecified chronic kidney disease: Secondary | ICD-10-CM | POA: Diagnosis not present

## 2022-04-16 DIAGNOSIS — I10 Essential (primary) hypertension: Secondary | ICD-10-CM | POA: Diagnosis present

## 2022-04-16 DIAGNOSIS — B379 Candidiasis, unspecified: Secondary | ICD-10-CM | POA: Diagnosis present

## 2022-04-16 DIAGNOSIS — E119 Type 2 diabetes mellitus without complications: Secondary | ICD-10-CM

## 2022-04-16 DIAGNOSIS — E11628 Type 2 diabetes mellitus with other skin complications: Secondary | ICD-10-CM | POA: Diagnosis present

## 2022-04-16 DIAGNOSIS — Z7984 Long term (current) use of oral hypoglycemic drugs: Secondary | ICD-10-CM | POA: Diagnosis not present

## 2022-04-16 DIAGNOSIS — E1122 Type 2 diabetes mellitus with diabetic chronic kidney disease: Secondary | ICD-10-CM | POA: Diagnosis not present

## 2022-04-16 DIAGNOSIS — Z794 Long term (current) use of insulin: Secondary | ICD-10-CM | POA: Diagnosis not present

## 2022-04-16 DIAGNOSIS — M7989 Other specified soft tissue disorders: Secondary | ICD-10-CM | POA: Diagnosis not present

## 2022-04-16 DIAGNOSIS — L089 Local infection of the skin and subcutaneous tissue, unspecified: Principal | ICD-10-CM

## 2022-04-16 DIAGNOSIS — M1A9XX1 Chronic gout, unspecified, with tophus (tophi): Secondary | ICD-10-CM | POA: Diagnosis not present

## 2022-04-16 DIAGNOSIS — L02612 Cutaneous abscess of left foot: Secondary | ICD-10-CM | POA: Diagnosis not present

## 2022-04-16 DIAGNOSIS — I70262 Atherosclerosis of native arteries of extremities with gangrene, left leg: Secondary | ICD-10-CM | POA: Diagnosis present

## 2022-04-16 DIAGNOSIS — R001 Bradycardia, unspecified: Secondary | ICD-10-CM | POA: Diagnosis not present

## 2022-04-16 DIAGNOSIS — D72829 Elevated white blood cell count, unspecified: Secondary | ICD-10-CM | POA: Diagnosis not present

## 2022-04-16 DIAGNOSIS — E785 Hyperlipidemia, unspecified: Secondary | ICD-10-CM | POA: Diagnosis not present

## 2022-04-16 DIAGNOSIS — N1832 Chronic kidney disease, stage 3b: Secondary | ICD-10-CM | POA: Diagnosis present

## 2022-04-16 DIAGNOSIS — D631 Anemia in chronic kidney disease: Secondary | ICD-10-CM | POA: Diagnosis present

## 2022-04-16 DIAGNOSIS — M10021 Idiopathic gout, right elbow: Secondary | ICD-10-CM | POA: Diagnosis present

## 2022-04-16 DIAGNOSIS — E1140 Type 2 diabetes mellitus with diabetic neuropathy, unspecified: Secondary | ICD-10-CM | POA: Diagnosis not present

## 2022-04-16 DIAGNOSIS — E1165 Type 2 diabetes mellitus with hyperglycemia: Secondary | ICD-10-CM | POA: Diagnosis not present

## 2022-04-16 DIAGNOSIS — E1142 Type 2 diabetes mellitus with diabetic polyneuropathy: Secondary | ICD-10-CM

## 2022-04-16 DIAGNOSIS — E1152 Type 2 diabetes mellitus with diabetic peripheral angiopathy with gangrene: Principal | ICD-10-CM | POA: Diagnosis present

## 2022-04-16 DIAGNOSIS — Z6828 Body mass index (BMI) 28.0-28.9, adult: Secondary | ICD-10-CM

## 2022-04-16 DIAGNOSIS — I4891 Unspecified atrial fibrillation: Secondary | ICD-10-CM | POA: Diagnosis not present

## 2022-04-16 DIAGNOSIS — I480 Paroxysmal atrial fibrillation: Secondary | ICD-10-CM

## 2022-04-16 DIAGNOSIS — N183 Chronic kidney disease, stage 3 unspecified: Secondary | ICD-10-CM

## 2022-04-16 DIAGNOSIS — E11621 Type 2 diabetes mellitus with foot ulcer: Secondary | ICD-10-CM | POA: Diagnosis not present

## 2022-04-16 NOTE — ED Provider Triage Note (Signed)
Emergency Medicine Provider Triage Evaluation Note  Tim Vincent , a 67 y.o. male  was evaluated in triage.  Pt complains of wound to the left foot x 1 month now with redness swelling puslike drainage x 3 days with associated chills and bodyaches.  Has not seen anyone for this prior to this so he is currently on cefdinir for prophylaxis following prostate biopsy.  Review of Systems  Positive: As above Negative: Fevers  Physical Exam  BP (!) 171/78   Pulse 83   Temp 98.7 F (37.1 C) (Oral)   Resp 17   SpO2 96%  Gen:   Awake, no distress   Resp:  Normal effort  MSK:   Moves extremities without difficulty  Other:  RRR no m/r/g, left foot with soft tissue swelling, erythema, 1+ DP pulse, and dorsal wound, oozing sanguinous and purulent drainage. Associated TTP.   Medical Decision Making  Medically screening exam initiated at 11:08 PM.  Appropriate orders placed.  Tim Vincent was informed that the remainder of the evaluation will be completed by another provider, this initial triage assessment does not replace that evaluation, and the importance of remaining in the ED until their evaluation is complete.  This chart was dictated using voice recognition software, Dragon. Despite the best efforts of this provider to proofread and correct errors, errors may still occur which can change documentation meaning.    Emeline Darling, PA-C 04/16/22 2318

## 2022-04-16 NOTE — ED Triage Notes (Addendum)
Patient reports diabetic left  foot infection with swelling and purulent drainage onset last week . Hypertensive at triage .

## 2022-04-17 ENCOUNTER — Emergency Department (HOSPITAL_COMMUNITY): Payer: 59

## 2022-04-17 ENCOUNTER — Inpatient Hospital Stay (HOSPITAL_COMMUNITY): Payer: 59

## 2022-04-17 ENCOUNTER — Encounter (HOSPITAL_COMMUNITY)
Admission: EM | Disposition: A | Discharge status: Home Health | Payer: Self-pay | Source: Home / Self Care | Attending: Internal Medicine

## 2022-04-17 ENCOUNTER — Inpatient Hospital Stay (HOSPITAL_COMMUNITY): Payer: 59 | Admitting: Certified Registered"

## 2022-04-17 ENCOUNTER — Encounter (HOSPITAL_COMMUNITY): Payer: Self-pay | Admitting: Family Medicine

## 2022-04-17 DIAGNOSIS — E11628 Type 2 diabetes mellitus with other skin complications: Secondary | ICD-10-CM | POA: Diagnosis not present

## 2022-04-17 DIAGNOSIS — E1122 Type 2 diabetes mellitus with diabetic chronic kidney disease: Secondary | ICD-10-CM | POA: Diagnosis present

## 2022-04-17 DIAGNOSIS — Z7984 Long term (current) use of oral hypoglycemic drugs: Secondary | ICD-10-CM | POA: Diagnosis not present

## 2022-04-17 DIAGNOSIS — M726 Necrotizing fasciitis: Secondary | ICD-10-CM

## 2022-04-17 DIAGNOSIS — E1140 Type 2 diabetes mellitus with diabetic neuropathy, unspecified: Secondary | ICD-10-CM | POA: Diagnosis present

## 2022-04-17 DIAGNOSIS — I70262 Atherosclerosis of native arteries of extremities with gangrene, left leg: Secondary | ICD-10-CM | POA: Diagnosis not present

## 2022-04-17 DIAGNOSIS — M109 Gout, unspecified: Secondary | ICD-10-CM | POA: Diagnosis present

## 2022-04-17 DIAGNOSIS — E785 Hyperlipidemia, unspecified: Secondary | ICD-10-CM | POA: Diagnosis not present

## 2022-04-17 DIAGNOSIS — E119 Type 2 diabetes mellitus without complications: Secondary | ICD-10-CM | POA: Diagnosis not present

## 2022-04-17 DIAGNOSIS — S91302A Unspecified open wound, left foot, initial encounter: Secondary | ICD-10-CM | POA: Diagnosis not present

## 2022-04-17 DIAGNOSIS — L089 Local infection of the skin and subcutaneous tissue, unspecified: Secondary | ICD-10-CM | POA: Diagnosis not present

## 2022-04-17 DIAGNOSIS — Z794 Long term (current) use of insulin: Secondary | ICD-10-CM | POA: Diagnosis not present

## 2022-04-17 DIAGNOSIS — E875 Hyperkalemia: Secondary | ICD-10-CM | POA: Diagnosis present

## 2022-04-17 DIAGNOSIS — D631 Anemia in chronic kidney disease: Secondary | ICD-10-CM | POA: Diagnosis present

## 2022-04-17 DIAGNOSIS — M10021 Idiopathic gout, right elbow: Secondary | ICD-10-CM | POA: Diagnosis present

## 2022-04-17 DIAGNOSIS — L02612 Cutaneous abscess of left foot: Secondary | ICD-10-CM | POA: Diagnosis present

## 2022-04-17 DIAGNOSIS — L309 Dermatitis, unspecified: Secondary | ICD-10-CM | POA: Diagnosis not present

## 2022-04-17 DIAGNOSIS — E1165 Type 2 diabetes mellitus with hyperglycemia: Secondary | ICD-10-CM | POA: Diagnosis not present

## 2022-04-17 DIAGNOSIS — M1A9XX1 Chronic gout, unspecified, with tophus (tophi): Secondary | ICD-10-CM | POA: Diagnosis not present

## 2022-04-17 DIAGNOSIS — M7989 Other specified soft tissue disorders: Secondary | ICD-10-CM | POA: Diagnosis not present

## 2022-04-17 DIAGNOSIS — I10 Essential (primary) hypertension: Secondary | ICD-10-CM | POA: Diagnosis not present

## 2022-04-17 DIAGNOSIS — Z6828 Body mass index (BMI) 28.0-28.9, adult: Secondary | ICD-10-CM | POA: Diagnosis not present

## 2022-04-17 DIAGNOSIS — E663 Overweight: Secondary | ICD-10-CM | POA: Diagnosis present

## 2022-04-17 DIAGNOSIS — I129 Hypertensive chronic kidney disease with stage 1 through stage 4 chronic kidney disease, or unspecified chronic kidney disease: Secondary | ICD-10-CM | POA: Diagnosis present

## 2022-04-17 DIAGNOSIS — E1152 Type 2 diabetes mellitus with diabetic peripheral angiopathy with gangrene: Secondary | ICD-10-CM | POA: Diagnosis present

## 2022-04-17 DIAGNOSIS — D72829 Elevated white blood cell count, unspecified: Secondary | ICD-10-CM | POA: Diagnosis present

## 2022-04-17 DIAGNOSIS — I4891 Unspecified atrial fibrillation: Secondary | ICD-10-CM | POA: Diagnosis not present

## 2022-04-17 DIAGNOSIS — B379 Candidiasis, unspecified: Secondary | ICD-10-CM | POA: Diagnosis present

## 2022-04-17 DIAGNOSIS — R001 Bradycardia, unspecified: Secondary | ICD-10-CM | POA: Diagnosis present

## 2022-04-17 DIAGNOSIS — N1832 Chronic kidney disease, stage 3b: Secondary | ICD-10-CM | POA: Diagnosis present

## 2022-04-17 HISTORY — PX: AMPUTATION: SHX166

## 2022-04-17 LAB — CBC WITH DIFFERENTIAL/PLATELET
Abs Immature Granulocytes: 0.25 10*3/uL — ABNORMAL HIGH (ref 0.00–0.07)
Basophils Absolute: 0 10*3/uL (ref 0.0–0.1)
Basophils Relative: 0 %
Eosinophils Absolute: 0 10*3/uL (ref 0.0–0.5)
Eosinophils Relative: 0 %
HCT: 36.7 % — ABNORMAL LOW (ref 39.0–52.0)
Hemoglobin: 11.8 g/dL — ABNORMAL LOW (ref 13.0–17.0)
Immature Granulocytes: 2 %
Lymphocytes Relative: 3 %
Lymphs Abs: 0.4 10*3/uL — ABNORMAL LOW (ref 0.7–4.0)
MCH: 29.1 pg (ref 26.0–34.0)
MCHC: 32.2 g/dL (ref 30.0–36.0)
MCV: 90.4 fL (ref 80.0–100.0)
Monocytes Absolute: 0.4 10*3/uL (ref 0.1–1.0)
Monocytes Relative: 3 %
Neutro Abs: 12.8 10*3/uL — ABNORMAL HIGH (ref 1.7–7.7)
Neutrophils Relative %: 92 %
Platelets: 334 10*3/uL (ref 150–400)
RBC: 4.06 MIL/uL — ABNORMAL LOW (ref 4.22–5.81)
RDW: 13.3 % (ref 11.5–15.5)
WBC: 13.8 10*3/uL — ABNORMAL HIGH (ref 4.0–10.5)
nRBC: 0 % (ref 0.0–0.2)

## 2022-04-17 LAB — BASIC METABOLIC PANEL
Anion gap: 14 (ref 5–15)
BUN: 84 mg/dL — ABNORMAL HIGH (ref 8–23)
CO2: 15 mmol/L — ABNORMAL LOW (ref 22–32)
Calcium: 7.9 mg/dL — ABNORMAL LOW (ref 8.9–10.3)
Chloride: 105 mmol/L (ref 98–111)
Creatinine, Ser: 3.1 mg/dL — ABNORMAL HIGH (ref 0.61–1.24)
GFR, Estimated: 21 mL/min — ABNORMAL LOW (ref >60)
Glucose, Bld: 282 mg/dL — ABNORMAL HIGH (ref 70–99)
Potassium: 4.8 mmol/L (ref 3.5–5.1)
Sodium: 134 mmol/L — ABNORMAL LOW (ref 135–145)

## 2022-04-17 LAB — CBC
HCT: 34.1 % — ABNORMAL LOW (ref 39.0–52.0)
Hemoglobin: 10.6 g/dL — ABNORMAL LOW (ref 13.0–17.0)
MCH: 28.6 pg (ref 26.0–34.0)
MCHC: 31.1 g/dL (ref 30.0–36.0)
MCV: 91.9 fL (ref 80.0–100.0)
Platelets: 293 10*3/uL (ref 150–400)
RBC: 3.71 MIL/uL — ABNORMAL LOW (ref 4.22–5.81)
RDW: 13.4 % (ref 11.5–15.5)
WBC: 11.8 10*3/uL — ABNORMAL HIGH (ref 4.0–10.5)
nRBC: 0 % (ref 0.0–0.2)

## 2022-04-17 LAB — COMPREHENSIVE METABOLIC PANEL
ALT: 42 U/L (ref 0–44)
AST: 27 U/L (ref 15–41)
Albumin: 2.5 g/dL — ABNORMAL LOW (ref 3.5–5.0)
Alkaline Phosphatase: 112 U/L (ref 38–126)
Anion gap: 13 (ref 5–15)
BUN: 81 mg/dL — ABNORMAL HIGH (ref 8–23)
CO2: 19 mmol/L — ABNORMAL LOW (ref 22–32)
Calcium: 8.4 mg/dL — ABNORMAL LOW (ref 8.9–10.3)
Chloride: 104 mmol/L (ref 98–111)
Creatinine, Ser: 3.07 mg/dL — ABNORMAL HIGH (ref 0.61–1.24)
GFR, Estimated: 21 mL/min — ABNORMAL LOW (ref >60)
Glucose, Bld: 389 mg/dL — ABNORMAL HIGH (ref 70–99)
Potassium: 5.3 mmol/L — ABNORMAL HIGH (ref 3.5–5.1)
Sodium: 136 mmol/L (ref 135–145)
Total Bilirubin: 0.4 mg/dL (ref 0.3–1.2)
Total Protein: 7.2 g/dL (ref 6.5–8.1)

## 2022-04-17 LAB — LACTIC ACID, PLASMA: Lactic Acid, Venous: 1.5 mmol/L (ref 0.5–1.9)

## 2022-04-17 LAB — SURGICAL PCR SCREEN
MRSA, PCR: NEGATIVE
Staphylococcus aureus: NEGATIVE

## 2022-04-17 LAB — GLUCOSE, CAPILLARY
Glucose-Capillary: 246 mg/dL — ABNORMAL HIGH (ref 70–99)
Glucose-Capillary: 132 mg/dL — ABNORMAL HIGH (ref 70–99)
Glucose-Capillary: 102 mg/dL — ABNORMAL HIGH (ref 70–99)
Glucose-Capillary: 117 mg/dL — ABNORMAL HIGH (ref 70–99)
Glucose-Capillary: 198 mg/dL — ABNORMAL HIGH (ref 70–99)
Glucose-Capillary: 347 mg/dL — ABNORMAL HIGH (ref 70–99)

## 2022-04-17 LAB — HIV ANTIBODY (ROUTINE TESTING W REFLEX): HIV Screen 4th Generation wRfx: NONREACTIVE

## 2022-04-17 LAB — HEMOGLOBIN A1C
Hgb A1c MFr Bld: 6.3 % — ABNORMAL HIGH (ref 4.8–5.6)
Mean Plasma Glucose: 134.11 mg/dL

## 2022-04-17 SURGERY — AMPUTATION, ABOVE KNEE
Anesthesia: General | Site: Knee | Laterality: Left

## 2022-04-17 MED ORDER — FENTANYL CITRATE (PF) 250 MCG/5ML IJ SOLN
INTRAMUSCULAR | Status: AC
  Filled 2022-04-17: qty 5

## 2022-04-17 MED ORDER — KETAMINE HCL 10 MG/ML IJ SOLN
INTRAMUSCULAR | Status: DC | PRN
  Administered 2022-04-17: 30 mg via INTRAVENOUS

## 2022-04-17 MED ORDER — ALUM & MAG HYDROXIDE-SIMETH 200-200-20 MG/5ML PO SUSP: 15.0000 mL | ORAL | Status: DC | PRN

## 2022-04-17 MED ORDER — HYDROMORPHONE HCL 1 MG/ML IJ SOLN
0.5000 mg | INTRAMUSCULAR | Status: DC | PRN
  Administered 2022-04-17: 0.5 mg via INTRAVENOUS
  Filled 2022-04-17: qty 1

## 2022-04-17 MED ORDER — KETAMINE HCL 50 MG/5ML IJ SOSY
PREFILLED_SYRINGE | INTRAMUSCULAR | Status: AC
  Filled 2022-04-17: qty 5

## 2022-04-17 MED ORDER — ACETAMINOPHEN 10 MG/ML IV SOLN
INTRAVENOUS | Status: DC | PRN
  Administered 2022-04-17: 1000 mg via INTRAVENOUS

## 2022-04-17 MED ORDER — SODIUM CHLORIDE 0.9 % IV SOLN
1.0000 g | Freq: Two times a day (BID) | INTRAVENOUS | Status: DC
  Administered 2022-04-17 – 2022-04-18 (×3): 1 g via INTRAVENOUS
  Filled 2022-04-17 (×4): qty 20

## 2022-04-17 MED ORDER — METRONIDAZOLE 500 MG PO TABS
500.0000 mg | ORAL_TABLET | Freq: Two times a day (BID) | ORAL | Status: DC
  Administered 2022-04-17: 500 mg via ORAL
  Filled 2022-04-17: qty 1

## 2022-04-17 MED ORDER — CEFAZOLIN SODIUM-DEXTROSE 2-4 GM/100ML-% IV SOLN
2.0000 g | INTRAVENOUS | Status: AC
  Administered 2022-04-17: 2 g via INTRAVENOUS
  Filled 2022-04-17: qty 100

## 2022-04-17 MED ORDER — PROPOFOL 10 MG/ML IV BOLUS
INTRAVENOUS | Status: DC | PRN
  Administered 2022-04-17: 140 mg via INTRAVENOUS

## 2022-04-17 MED ORDER — INSULIN ASPART 100 UNIT/ML IJ SOLN
0.0000 [IU] | Freq: Three times a day (TID) | INTRAMUSCULAR | Status: DC
  Administered 2022-04-17: 3 [IU] via SUBCUTANEOUS
  Administered 2022-04-17: 5 [IU] via SUBCUTANEOUS
  Administered 2022-04-18: 11 [IU] via SUBCUTANEOUS
  Administered 2022-04-18: 8 [IU] via SUBCUTANEOUS
  Administered 2022-04-18: 11 [IU] via SUBCUTANEOUS
  Administered 2022-04-19: 3 [IU] via SUBCUTANEOUS
  Administered 2022-04-19 (×2): 2 [IU] via SUBCUTANEOUS
  Administered 2022-04-20: 3 [IU] via SUBCUTANEOUS
  Administered 2022-04-21: 2 [IU] via SUBCUTANEOUS

## 2022-04-17 MED ORDER — TRAZODONE HCL 50 MG PO TABS
25.0000 mg | ORAL_TABLET | Freq: Every evening | ORAL | Status: DC | PRN
  Filled 2022-04-17: qty 1

## 2022-04-17 MED ORDER — ONDANSETRON HCL 4 MG/2ML IJ SOLN
4.0000 mg | Freq: Four times a day (QID) | INTRAMUSCULAR | Status: DC | PRN
  Administered 2022-04-20: 4 mg via INTRAVENOUS
  Filled 2022-04-17: qty 2

## 2022-04-17 MED ORDER — METOPROLOL TARTRATE 5 MG/5ML IV SOLN: 2.0000 mg | INTRAVENOUS | Status: DC | PRN

## 2022-04-17 MED ORDER — MORPHINE SULFATE (PF) 4 MG/ML IV SOLN
4.0000 mg | Freq: Once | INTRAVENOUS | Status: AC
  Administered 2022-04-17: 4 mg via INTRAVENOUS
  Filled 2022-04-17: qty 1

## 2022-04-17 MED ORDER — PANTOPRAZOLE SODIUM 40 MG PO TBEC
40.0000 mg | DELAYED_RELEASE_TABLET | Freq: Every day | ORAL | Status: DC
  Administered 2022-04-17 – 2022-04-21 (×5): 40 mg via ORAL
  Filled 2022-04-17 (×5): qty 1

## 2022-04-17 MED ORDER — LABETALOL HCL 5 MG/ML IV SOLN: 10.0000 mg | INTRAVENOUS | Status: DC | PRN

## 2022-04-17 MED ORDER — CLINDAMYCIN PHOSPHATE 600 MG/50ML IV SOLN: 600.0000 mg | Freq: Three times a day (TID) | INTRAVENOUS | Status: DC

## 2022-04-17 MED ORDER — LISINOPRIL 10 MG PO TABS
10.0000 mg | ORAL_TABLET | Freq: Every day | ORAL | Status: DC
  Administered 2022-04-17: 10 mg via ORAL
  Filled 2022-04-17: qty 1

## 2022-04-17 MED ORDER — FENTANYL CITRATE (PF) 100 MCG/2ML IJ SOLN
INTRAMUSCULAR | Status: AC
  Filled 2022-04-17: qty 2

## 2022-04-17 MED ORDER — DEXAMETHASONE SODIUM PHOSPHATE 10 MG/ML IJ SOLN
INTRAMUSCULAR | Status: DC | PRN
  Administered 2022-04-17: 5 mg via INTRAVENOUS

## 2022-04-17 MED ORDER — SODIUM CHLORIDE 0.9 % IV SOLN: INTRAVENOUS | Status: DC

## 2022-04-17 MED ORDER — JUVEN PO PACK
1.0000 | PACK | Freq: Two times a day (BID) | ORAL | Status: DC
  Administered 2022-04-18 – 2022-04-21 (×7): 1 via ORAL
  Filled 2022-04-17 (×8): qty 1

## 2022-04-17 MED ORDER — ENOXAPARIN SODIUM 40 MG/0.4ML IJ SOSY
40.0000 mg | PREFILLED_SYRINGE | INTRAMUSCULAR | Status: DC
  Filled 2022-04-17: qty 0.4

## 2022-04-17 MED ORDER — SODIUM CHLORIDE 0.9 % IV SOLN: 2.0000 g | Freq: Once | INTRAVENOUS | Status: DC

## 2022-04-17 MED ORDER — ONDANSETRON HCL 4 MG/2ML IJ SOLN: 4.0000 mg | Freq: Four times a day (QID) | INTRAMUSCULAR | Status: DC | PRN

## 2022-04-17 MED ORDER — CHLORHEXIDINE GLUCONATE 0.12 % MT SOLN
OROMUCOSAL | Status: AC
  Filled 2022-04-17: qty 15

## 2022-04-17 MED ORDER — DOCUSATE SODIUM 100 MG PO CAPS
100.0000 mg | ORAL_CAPSULE | Freq: Every day | ORAL | Status: DC
  Administered 2022-04-18 – 2022-04-21 (×4): 100 mg via ORAL
  Filled 2022-04-17 (×5): qty 1

## 2022-04-17 MED ORDER — SODIUM CHLORIDE 0.9 % IV SOLN
2.0000 g | INTRAVENOUS | Status: DC
  Administered 2022-04-17: 2 g via INTRAVENOUS
  Filled 2022-04-17: qty 12.5

## 2022-04-17 MED ORDER — MIDAZOLAM HCL 2 MG/2ML IJ SOLN
INTRAMUSCULAR | Status: AC
  Filled 2022-04-17: qty 2

## 2022-04-17 MED ORDER — ENOXAPARIN SODIUM 30 MG/0.3ML IJ SOSY
30.0000 mg | PREFILLED_SYRINGE | INTRAMUSCULAR | Status: DC
  Administered 2022-04-18 – 2022-04-19 (×2): 30 mg via SUBCUTANEOUS
  Filled 2022-04-17 (×2): qty 0.3

## 2022-04-17 MED ORDER — ONDANSETRON HCL 4 MG PO TABS: 4.0000 mg | ORAL_TABLET | Freq: Four times a day (QID) | ORAL | Status: DC | PRN

## 2022-04-17 MED ORDER — ACETAMINOPHEN 10 MG/ML IV SOLN
INTRAVENOUS | Status: AC
  Filled 2022-04-17: qty 100

## 2022-04-17 MED ORDER — CEFAZOLIN SODIUM-DEXTROSE 2-4 GM/100ML-% IV SOLN
INTRAVENOUS | Status: AC
  Filled 2022-04-17: qty 100

## 2022-04-17 MED ORDER — PHENYLEPHRINE 80 MCG/ML (10ML) SYRINGE FOR IV PUSH (FOR BLOOD PRESSURE SUPPORT)
PREFILLED_SYRINGE | INTRAVENOUS | Status: DC | PRN
  Administered 2022-04-17: 160 ug via INTRAVENOUS

## 2022-04-17 MED ORDER — ACETAMINOPHEN 650 MG RE SUPP: 650.0000 mg | Freq: Four times a day (QID) | RECTAL | Status: DC | PRN

## 2022-04-17 MED ORDER — NICARDIPINE HCL IN NACL 20-0.86 MG/200ML-% IV SOLN
INTRAVENOUS | Status: AC
  Filled 2022-04-17: qty 200

## 2022-04-17 MED ORDER — ZINC SULFATE 220 (50 ZN) MG PO CAPS
220.0000 mg | ORAL_CAPSULE | Freq: Every day | ORAL | Status: DC
  Administered 2022-04-17 – 2022-04-21 (×5): 220 mg via ORAL
  Filled 2022-04-17 (×5): qty 1

## 2022-04-17 MED ORDER — ROCURONIUM BROMIDE 100 MG/10ML IV SOLN
INTRAVENOUS | Status: DC | PRN
  Administered 2022-04-17: 50 mg via INTRAVENOUS

## 2022-04-17 MED ORDER — POVIDONE-IODINE 10 % EX SWAB: 2.0000 | Freq: Once | CUTANEOUS | Status: DC

## 2022-04-17 MED ORDER — BISACODYL 5 MG PO TBEC
5.0000 mg | DELAYED_RELEASE_TABLET | Freq: Every day | ORAL | Status: DC | PRN
  Administered 2022-04-19: 5 mg via ORAL
  Filled 2022-04-17: qty 1

## 2022-04-17 MED ORDER — ESMOLOL HCL-SODIUM CHLORIDE 2000 MG/100ML IV SOLN
INTRAVENOUS | Status: DC | PRN
  Administered 2022-04-17 (×4): 10 mg via INTRAVENOUS
  Administered 2022-04-17: 30 mg via INTRAVENOUS

## 2022-04-17 MED ORDER — POTASSIUM CHLORIDE CRYS ER 20 MEQ PO TBCR: 20.0000 meq | EXTENDED_RELEASE_TABLET | Freq: Every day | ORAL | Status: DC | PRN

## 2022-04-17 MED ORDER — MORPHINE SULFATE (PF) 2 MG/ML IV SOLN
2.0000 mg | INTRAVENOUS | Status: DC | PRN
  Filled 2022-04-17: qty 1

## 2022-04-17 MED ORDER — HYDRALAZINE HCL 20 MG/ML IJ SOLN: 5.0000 mg | INTRAMUSCULAR | Status: DC | PRN

## 2022-04-17 MED ORDER — INFLUENZA VAC A&B SA ADJ QUAD 0.5 ML IM PRSY
0.5000 mL | PREFILLED_SYRINGE | INTRAMUSCULAR | Status: DC
  Filled 2022-04-17: qty 0.5

## 2022-04-17 MED ORDER — SODIUM CHLORIDE 0.9 % IV SOLN
2.0000 g | INTRAVENOUS | Status: DC
  Administered 2022-04-17: 2 g via INTRAVENOUS
  Filled 2022-04-17: qty 20

## 2022-04-17 MED ORDER — LIDOCAINE 2% (20 MG/ML) 5 ML SYRINGE
INTRAMUSCULAR | Status: DC | PRN
  Administered 2022-04-17: 80 mg via INTRAVENOUS

## 2022-04-17 MED ORDER — OXYCODONE HCL 5 MG PO TABS
10.0000 mg | ORAL_TABLET | ORAL | Status: DC | PRN
  Administered 2022-04-19 – 2022-04-20 (×2): 10 mg via ORAL
  Filled 2022-04-17: qty 2

## 2022-04-17 MED ORDER — DILTIAZEM HCL 25 MG/5ML IV SOLN
15.0000 mg | Freq: Once | INTRAVENOUS | Status: AC
  Administered 2022-04-17: 15 mg via INTRAVENOUS
  Filled 2022-04-17 (×2): qty 5

## 2022-04-17 MED ORDER — VANCOMYCIN HCL IN DEXTROSE 1-5 GM/200ML-% IV SOLN: 1000.0000 mg | Freq: Once | INTRAVENOUS | Status: DC

## 2022-04-17 MED ORDER — PIPERACILLIN-TAZOBACTAM 3.375 G IVPB 30 MIN: 3.3750 g | Freq: Once | INTRAVENOUS | Status: DC

## 2022-04-17 MED ORDER — FENTANYL CITRATE (PF) 100 MCG/2ML IJ SOLN
INTRAMUSCULAR | Status: DC | PRN
  Administered 2022-04-17: 100 ug via INTRAVENOUS

## 2022-04-17 MED ORDER — CHLORHEXIDINE GLUCONATE 0.12 % MT SOLN
OROMUCOSAL | Status: AC
  Administered 2022-04-17: 15 mL
  Filled 2022-04-17: qty 15

## 2022-04-17 MED ORDER — PROPOFOL 10 MG/ML IV BOLUS
INTRAVENOUS | Status: AC
  Filled 2022-04-17: qty 20

## 2022-04-17 MED ORDER — VANCOMYCIN HCL 1250 MG/250ML IV SOLN
1250.0000 mg | INTRAVENOUS | Status: DC
  Administered 2022-04-17: 1250 mg via INTRAVENOUS
  Filled 2022-04-17: qty 250

## 2022-04-17 MED ORDER — MIDAZOLAM HCL 5 MG/5ML IJ SOLN
INTRAMUSCULAR | Status: DC | PRN
  Administered 2022-04-17: 2 mg via INTRAVENOUS

## 2022-04-17 MED ORDER — FENTANYL CITRATE (PF) 100 MCG/2ML IJ SOLN
25.0000 ug | INTRAMUSCULAR | Status: DC | PRN
  Administered 2022-04-17 (×3): 50 ug via INTRAVENOUS

## 2022-04-17 MED ORDER — SUGAMMADEX SODIUM 200 MG/2ML IV SOLN
INTRAVENOUS | Status: DC | PRN
  Administered 2022-04-17 (×3): 50 mg via INTRAVENOUS

## 2022-04-17 MED ORDER — LINEZOLID 600 MG/300ML IV SOLN
600.0000 mg | Freq: Two times a day (BID) | INTRAVENOUS | Status: DC
  Administered 2022-04-17 – 2022-04-18 (×3): 600 mg via INTRAVENOUS
  Filled 2022-04-17 (×5): qty 300

## 2022-04-17 MED ORDER — ONDANSETRON HCL 4 MG/2ML IJ SOLN
INTRAMUSCULAR | Status: DC | PRN
  Administered 2022-04-17: 4 mg via INTRAVENOUS

## 2022-04-17 MED ORDER — MAGNESIUM HYDROXIDE 400 MG/5ML PO SUSP: 30.0000 mL | Freq: Every day | ORAL | Status: DC | PRN

## 2022-04-17 MED ORDER — ONDANSETRON HCL 4 MG/2ML IJ SOLN: 4.0000 mg | Freq: Once | INTRAMUSCULAR | Status: DC | PRN

## 2022-04-17 MED ORDER — POLYETHYLENE GLYCOL 3350 17 G PO PACK: 17.0000 g | PACK | Freq: Every day | ORAL | Status: DC | PRN

## 2022-04-17 MED ORDER — GUAIFENESIN-DM 100-10 MG/5ML PO SYRP: 15.0000 mL | ORAL_SOLUTION | ORAL | Status: DC | PRN

## 2022-04-17 MED ORDER — CEFAZOLIN SODIUM-DEXTROSE 2-4 GM/100ML-% IV SOLN: 2.0000 g | Freq: Three times a day (TID) | INTRAVENOUS | Status: DC

## 2022-04-17 MED ORDER — MAGNESIUM SULFATE 2 GM/50ML IV SOLN: 2.0000 g | Freq: Every day | INTRAVENOUS | Status: DC | PRN

## 2022-04-17 MED ORDER — ACETAMINOPHEN 325 MG PO TABS: 325.0000 mg | ORAL_TABLET | Freq: Four times a day (QID) | ORAL | Status: DC | PRN

## 2022-04-17 MED ORDER — OXYCODONE HCL 5 MG PO TABS
5.0000 mg | ORAL_TABLET | ORAL | Status: DC | PRN
  Administered 2022-04-17 – 2022-04-18 (×2): 10 mg via ORAL
  Administered 2022-04-19: 5 mg via ORAL
  Administered 2022-04-21: 10 mg via ORAL
  Filled 2022-04-17: qty 2
  Filled 2022-04-17: qty 1
  Filled 2022-04-17 (×3): qty 2

## 2022-04-17 MED ORDER — LINAGLIPTIN 5 MG PO TABS
5.0000 mg | ORAL_TABLET | Freq: Every day | ORAL | Status: DC
  Administered 2022-04-17 – 2022-04-21 (×5): 5 mg via ORAL
  Filled 2022-04-17 (×5): qty 1

## 2022-04-17 MED ORDER — MAGNESIUM CITRATE PO SOLN: 1.0000 | Freq: Once | ORAL | Status: DC | PRN

## 2022-04-17 MED ORDER — CHLORHEXIDINE GLUCONATE 4 % EX LIQD: 60.0000 mL | Freq: Once | CUTANEOUS | Status: DC

## 2022-04-17 MED ORDER — TRANEXAMIC ACID-NACL 1000-0.7 MG/100ML-% IV SOLN
INTRAVENOUS | Status: DC | PRN
  Administered 2022-04-17: 1000 mg via INTRAVENOUS

## 2022-04-17 MED ORDER — METOPROLOL TARTRATE 12.5 MG HALF TABLET
12.5000 mg | ORAL_TABLET | Freq: Two times a day (BID) | ORAL | Status: DC
  Administered 2022-04-17 – 2022-04-18 (×4): 12.5 mg via ORAL
  Filled 2022-04-17 (×5): qty 1

## 2022-04-17 MED ORDER — ACETAMINOPHEN 325 MG PO TABS: 650.0000 mg | ORAL_TABLET | Freq: Four times a day (QID) | ORAL | Status: DC | PRN

## 2022-04-17 MED ORDER — TRANEXAMIC ACID-NACL 1000-0.7 MG/100ML-% IV SOLN
INTRAVENOUS | Status: AC
  Filled 2022-04-17: qty 100

## 2022-04-17 MED ORDER — ATORVASTATIN CALCIUM 40 MG PO TABS
40.0000 mg | ORAL_TABLET | Freq: Every day | ORAL | Status: DC
  Administered 2022-04-17 – 2022-04-21 (×5): 40 mg via ORAL
  Filled 2022-04-17 (×5): qty 1

## 2022-04-17 MED ORDER — PHENOL 1.4 % MT LIQD: 1.0000 | OROMUCOSAL | Status: DC | PRN

## 2022-04-17 MED ORDER — INSULIN ASPART 100 UNIT/ML IJ SOLN
0.0000 [IU] | Freq: Every day | INTRAMUSCULAR | Status: DC
  Administered 2022-04-17: 4 [IU] via SUBCUTANEOUS

## 2022-04-17 MED ORDER — VITAMIN C 500 MG PO TABS
1000.0000 mg | ORAL_TABLET | Freq: Every day | ORAL | Status: DC
  Administered 2022-04-17 – 2022-04-21 (×5): 1000 mg via ORAL
  Filled 2022-04-17 (×5): qty 2

## 2022-04-17 MED ORDER — 0.9 % SODIUM CHLORIDE (POUR BTL) OPTIME
TOPICAL | Status: DC | PRN
  Administered 2022-04-17: 1000 mL

## 2022-04-17 MED ORDER — ALBUMIN HUMAN 5 % IV SOLN: INTRAVENOUS | Status: DC | PRN

## 2022-04-17 SURGICAL SUPPLY — 43 items
BAG COUNTER SPONGE SURGICOUNT (BAG) IMPLANT
BLADE SAW RECIP 87.9 MT (BLADE) ×1 IMPLANT
BLADE SURG 21 STRL SS (BLADE) ×1 IMPLANT
BNDG COHESIVE 6X5 TAN STRL LF (GAUZE/BANDAGES/DRESSINGS) ×1 IMPLANT
CANISTER WOUND CARE 500ML ATS (WOUND CARE) IMPLANT
CNTNR URN SCR LID CUP LEK RST (MISCELLANEOUS) IMPLANT
CONT SPEC 4OZ STRL OR WHT (MISCELLANEOUS) ×1
COVER SURGICAL LIGHT HANDLE (MISCELLANEOUS) ×1 IMPLANT
CUFF TOURN SGL QUICK 34 (TOURNIQUET CUFF)
CUFF TRNQT CYL 34X4.125X (TOURNIQUET CUFF) IMPLANT
DRAPE DERMATAC (DRAPES) IMPLANT
DRAPE INCISE IOBAN 66X45 STRL (DRAPES) ×2 IMPLANT
DRAPE U-SHAPE 47X51 STRL (DRAPES) ×1 IMPLANT
DRESSING PREVENA PLUS CUSTOM (GAUZE/BANDAGES/DRESSINGS) ×1 IMPLANT
DRSG PREVENA PLUS CUSTOM (GAUZE/BANDAGES/DRESSINGS) ×1
DURAPREP 26ML APPLICATOR (WOUND CARE) ×1 IMPLANT
ELECT REM PT RETURN 9FT ADLT (ELECTROSURGICAL) ×1
ELECTRODE REM PT RTRN 9FT ADLT (ELECTROSURGICAL) ×1 IMPLANT
GLOVE BIOGEL PI IND STRL 7.5 (GLOVE) ×1 IMPLANT
GLOVE BIOGEL PI IND STRL 9 (GLOVE) ×1 IMPLANT
GLOVE SURG ORTHO 9.0 STRL STRW (GLOVE) ×1 IMPLANT
GLOVE SURG SS PI 6.5 STRL IVOR (GLOVE) ×1 IMPLANT
GOWN STRL REUS W/ TWL LRG LVL3 (GOWN DISPOSABLE) ×1 IMPLANT
GOWN STRL REUS W/ TWL XL LVL3 (GOWN DISPOSABLE) ×2 IMPLANT
GOWN STRL REUS W/TWL LRG LVL3 (GOWN DISPOSABLE) ×1
GOWN STRL REUS W/TWL XL LVL3 (GOWN DISPOSABLE) ×2
GRAFT SKIN MARIGEN MICRO 38 (Tissue) IMPLANT
KIT BASIN OR (CUSTOM PROCEDURE TRAY) ×1 IMPLANT
KIT TURNOVER KIT B (KITS) ×1 IMPLANT
MANIFOLD NEPTUNE II (INSTRUMENTS) ×1 IMPLANT
NS IRRIG 1000ML POUR BTL (IV SOLUTION) ×1 IMPLANT
PACK ORTHO EXTREMITY (CUSTOM PROCEDURE TRAY) ×1 IMPLANT
PAD ARMBOARD 7.5X6 YLW CONV (MISCELLANEOUS) ×1 IMPLANT
PREVENA RESTOR ARTHOFORM 46X30 (CANNISTER) ×1 IMPLANT
STAPLER VISISTAT 35W (STAPLE) IMPLANT
STOCKINETTE IMPERVIOUS LG (DRAPES) IMPLANT
SUT ETHILON 2 0 PSLX (SUTURE) ×2 IMPLANT
SUT SILK 2 0 (SUTURE) ×1
SUT SILK 2-0 18XBRD TIE 12 (SUTURE) ×1 IMPLANT
SUT VIC AB 1 CTX 27 (SUTURE) IMPLANT
TOWEL GREEN STERILE FF (TOWEL DISPOSABLE) ×1 IMPLANT
TUBE CONNECTING 20X1/4 (TUBING) ×1 IMPLANT
YANKAUER SUCT BULB TIP NO VENT (SUCTIONS) ×1 IMPLANT

## 2022-04-17 NOTE — Anesthesia Procedure Notes (Signed)
Procedure Name: Intubation Date/Time: 04/17/2022 1:55 PM  Performed by: Gwyndolyn Saxon, CRNAPre-anesthesia Checklist: Patient identified, Emergency Drugs available, Suction available and Patient being monitored Patient Re-evaluated:Patient Re-evaluated prior to induction Oxygen Delivery Method: Circle system utilized Preoxygenation: Pre-oxygenation with 100% oxygen Induction Type: IV induction Ventilation: Mask ventilation without difficulty Laryngoscope Size: Mac and 4 Grade View: Grade III Tube type: Oral Tube size: 7.5 mm Number of attempts: 2 Airway Equipment and Method: Patient positioned with wedge pillow and Stylet Placement Confirmation: positive ETCO2 and breath sounds checked- equal and bilateral Secured at: 23 cm Tube secured with: Tape Dental Injury: Teeth and Oropharynx as per pre-operative assessment  Difficulty Due To: Difficulty was anticipated and Difficult Airway- due to anterior larynx

## 2022-04-17 NOTE — Op Note (Signed)
04/17/2022  2:50 PM  PATIENT:  Tim Vincent    PRE-OPERATIVE DIAGNOSIS:  necrotizing fasciatitis left leg  POST-OPERATIVE DIAGNOSIS:  Same  PROCEDURE:  LEFT ABOVE KNEE AMPUTATION .  Application of Kerecis micro graft 38 cm. Application of circumferential negative pressure wound therapy with Prevena customizable sponge.  SURGEON:  Newt Minion, MD  PHYSICIAN ASSISTANT: April Green ANESTHESIA:   General  PREOPERATIVE INDICATIONS:  Tim Vincent is a  67 y.o. male with a diagnosis of necrotizing fasciatitis left leg who failed conservative measures and elected for surgical management.    The risks benefits and alternatives were discussed with the patient preoperatively including but not limited to the risks of infection, bleeding, nerve injury, cardiopulmonary complications, the need for revision surgery, among others, and the patient was willing to proceed.  OPERATIVE IMPLANTS: Kerecis micro graft 38 cm.    @ENCIMAGES @  OPERATIVE FINDINGS: Patient had a necrotic abscess draining fluid.  Tissue was sent for cultures.  Tissue margins of the amputation site were clear.  OPERATIVE PROCEDURE: Patient brought the operating room and underwent a general anesthetic.  After adequate levels anesthesia were obtained patient's left lower extremity was prepped using DuraPrep draped into a sterile field the infected left leg was draped out of the sterile field with impervious stockinette.  A fishmouth incision was made just proximal to the patella.  This was carried down to to the neurovascular bundle medially this was clamped and suture-ligated with 2-0 silk.  A reciprocating saw was used to complete the above-knee amputation.  The amputation was completed.  The tissue margins were clear.  Electrocautery was used for hemostasis.  The wound was irrigated with normal saline.  The SFA had good color and contractility and consistency.  No signs of infection at the site. The deep and superficial fascial  layers were closed using #1 Vicryl.  The skin was closed using 2-0 nylon.  A Prevena customizable sponge was applied as a circumferential dressing.  This had a good suction fit patient was extubated taken to PACU in stable condition.   DISCHARGE PLANNING:  Antibiotic duration: Would continue IV antibiotics and adjust according to the tissue cultures.  The wound margins were clear.    Weightbearing: Nonweightbearing on the left.   Pain medication: Opioid pathway  Dressing care/ Wound VAC: Wound VAC for 1 week  Ambulatory devices: Walker  Discharge to: Discharge planning based on therapy recommendations.  Follow-up: In the office 1 week post operative.

## 2022-04-17 NOTE — Anesthesia Preprocedure Evaluation (Addendum)
Anesthesia Evaluation  Patient identified by MRN, date of birth, ID band Patient awake    Reviewed: Allergy & Precautions, NPO status , Patient's Chart, lab work & pertinent test results  Airway Mallampati: II  TM Distance: >3 FB Neck ROM: Full    Dental  (+) Teeth Intact, Dental Advisory Given   Pulmonary neg pulmonary ROS   Pulmonary exam normal breath sounds clear to auscultation       Cardiovascular hypertension, Pt. on medications Normal cardiovascular exam Rhythm:Regular Rate:Normal     Neuro/Psych negative neurological ROS     GI/Hepatic negative GI ROS, Neg liver ROS,,,  Endo/Other  diabetes, Poorly Controlled, Type 2, Oral Hypoglycemic Agents    Renal/GU negative Renal ROS     Musculoskeletal necrotizing fasciatitis left leg   Abdominal   Peds  Hematology negative hematology ROS (+)   Anesthesia Other Findings Day of surgery medications reviewed with the patient.  Reproductive/Obstetrics                             Anesthesia Physical Anesthesia Plan  ASA: 4  Anesthesia Plan: General   Post-op Pain Management: Ofirmev IV (intra-op)* and Ketamine IV*   Induction: Intravenous  PONV Risk Score and Plan: 3 and Midazolam, Dexamethasone and Ondansetron  Airway Management Planned: LMA  Additional Equipment:   Intra-op Plan:   Post-operative Plan: Extubation in OR  Informed Consent: I have reviewed the patients History and Physical, chart, labs and discussed the procedure including the risks, benefits and alternatives for the proposed anesthesia with the patient or authorized representative who has indicated his/her understanding and acceptance.     Dental advisory given and Interpreter used for interveiw  Plan Discussed with: CRNA  Anesthesia Plan Comments:        Anesthesia Quick Evaluation

## 2022-04-17 NOTE — ED Notes (Signed)
ED TO INPATIENT HANDOFF REPORT  ED Nurse Name and Phone #: Gwinda Passe  S Name/Age/Gender Tim Vincent [place] 67 y.o. male Room/Bed: 020C/020C  Code Status   Code Status: Full Code  Home/SNF/Other Home Patient oriented to: self, place, time, and situation Is this baseline? Yes   Triage Complete: Triage complete  Chief Complaint Diabetic infection of left foot ([place]) [F62.130, L08.9]  Triage Note Patient reports diabetic left  foot infection with swelling and purulent drainage onset last week . Hypertensive at triage .    Allergies No Known Allergies  Level of Care/Admitting Diagnosis ED Disposition     ED Disposition  Admit   Condition  --   Comment  Hospital Area: [place] [[id number]]  Level of Care: Med-Surg [16]  May admit patient to [facility] or [facility] if equivalent level of care is available:: No  Covid Evaluation: Asymptomatic - no recent exposure (last 10 days) testing not required  Diagnosis: Diabetic infection of left foot [facility]) [[id number]]  Admitting Physician: Christel Mormon [[id number]]  Attending Physician: Christel Mormon [[id number]]  Certification:: I certify this patient will need inpatient services for at least 2 midnights  Estimated Length of Stay: 2          B Medical/Surgery History Past Medical History:  Diagnosis Date   Diabetes mellitus without complication ([place])    Hypertension    Past Surgical History:  Procedure Laterality Date   MASS EXCISION Left 04/17/2021   Procedure: EXCISION LEFT ELBOW MASS;  Surgeon: Cindra Presume, MD;  Location: [place];  Service: Plastics;  Laterality: Left;  1 hour   MASS EXCISION Left 08/10/2021   Procedure: EXCISION ELBOW MASS;  Surgeon: Cindra Presume, MD;  Location: [place];  Service: Plastics;  Laterality: Left;  1 hour     A IV Location/Drains/Wounds Patient Lines/Drains/Airways Status     Active Line/Drains/Airways     Name Placement date  Placement time Site Days   Peripheral IV 04/17/22 20 G Anterior;Left;Proximal Forearm 04/17/22  0213  Forearm  less than 1   Incision (Closed) 08/10/21 Arm Left 08/10/21  1242  -- 250            Intake/Output Last 24 hours No intake or output data in the 24 hours ending 04/17/22 0252  Labs/Imaging Results for orders placed or performed during the hospital encounter of 04/16/22 (from the past 48 hour(s))  Comprehensive metabolic panel     Status: Abnormal   Collection Time: 04/16/22 11:33 PM  Result Value Ref Range   Sodium 136 135 - 145 mmol/L   Potassium 5.3 (H) 3.5 - 5.1 mmol/L   Chloride 104 98 - 111 mmol/L   CO2 19 (L) 22 - 32 mmol/L   Glucose, Bld 389 (H) 70 - 99 mg/dL    Comment: Glucose reference range applies only to samples taken after fasting for at least 8 hours.   BUN 81 (H) 8 - 23 mg/dL   Creatinine, Ser 3.07 (H) 0.61 - 1.24 mg/dL   Calcium 8.4 (L) 8.9 - 10.3 mg/dL   Total Protein 7.2 6.5 - 8.1 g/dL   Albumin 2.5 (L) 3.5 - 5.0 g/dL   AST 27 15 - 41 U/L   ALT 42 0 - 44 U/L   Alkaline Phosphatase 112 38 - 126 U/L   Total Bilirubin 0.4 0.3 - 1.2 mg/dL   GFR, Estimated 21 (L) >60 mL/min    Comment: (NOTE) Calculated using the  CKD-EPI Creatinine Equation (2021)    Anion gap 13 5 - 15    Comment: Performed at [facility], [place] [address], [place], [place] [address]  CBC with Differential     Status: Abnormal   Collection Time: 04/16/22 11:33 PM  Result Value Ref Range   WBC 13.8 (H) 4.0 - 10.5 K/uL   RBC 4.06 (L) 4.22 - 5.81 MIL/uL   Hemoglobin 11.8 (L) 13.0 - 17.0 g/dL   HCT 36.7 (L) 39.0 - 52.0 %   MCV 90.4 80.0 - 100.0 fL   MCH 29.1 26.0 - 34.0 pg   MCHC 32.2 30.0 - 36.0 g/dL   RDW 13.3 11.5 - 15.5 %   Platelets 334 150 - 400 K/uL   nRBC 0.0 0.0 - 0.2 %   Neutrophils Relative % 92 %   Neutro Abs 12.8 (H) 1.7 - 7.7 K/uL   Lymphocytes Relative 3 %   Lymphs Abs 0.4 (L) 0.7 - 4.0 K/uL   Monocytes Relative 3 %   Monocytes Absolute 0.4 0.1 - 1.0 K/uL    Eosinophils Relative 0 %   Eosinophils Absolute 0.0 0.0 - 0.5 K/uL   Basophils Relative 0 %   Basophils Absolute 0.0 0.0 - 0.1 K/uL   Immature Granulocytes 2 %   Abs Immature Granulocytes 0.25 (H) 0.00 - 0.07 K/uL    Comment: Performed at [facility] [address], [place], [place] [address]  Lactic acid     Status: None   Collection Time: 04/16/22 11:33 PM  Result Value Ref Range   Lactic Acid, Venous 1.5 0.5 - 1.9 mmol/L    Comment: Performed at [facility] [address] [place], [place] [address]   DG Foot Complete Left  Result Date: 04/17/2022 CLINICAL DATA:  [id number], wound cellulitis left foot. EXAM: LEFT FOOT - COMPLETE 3+ VIEW COMPARISON:  None Available. FINDINGS: There is diffuse swelling in the distal foreleg and foot. Heavy vascular calcifications. There is scattered soft tissue gas subcutaneously the anterior distal foreleg and superior aspect of the foot and a greater amount in the superior and inferior forefoot, the latter primarily around the second and third toe metatarsophalangeal region. 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 No destructive bone lesion is seen. There is hallux valgus and mild nonerosive degenerative change of the first MTP joint, midfoot arthrosis also noted and small plantar and dorsal calcaneal heel spurs. There is a chronic healed fracture deformity of the distal fifth metatarsal shaft. No acute fracture is seen.  The bone mineralization is normal. IMPRESSION: 1. Diffuse swelling in the distal foreleg and foot. 2. Scattered soft tissue gas in the distal foreleg and superior aspect of the foot, greater soft tissue gas in the forefoot consistent with a gas-forming infection such as gas gangrene. 3. No destructive bone lesion is seen. 4. Heavy vascular calcifications. 5. Additional chronic changes. Electronically Signed   By: Telford Nab M.D.   On: 04/17/2022 00:47    Pending Labs Unresulted Labs (From  admission, onward)     Start     Ordered   04/17/22 5732  Basic metabolic panel  Tomorrow morning,   R        04/17/22 0233   04/17/22 0500  CBC  Tomorrow morning,   R        04/17/22 0233   04/17/22 0136  HIV Antibody (routine testing w rflx)  (HIV Antibody (Routine testing w reflex) panel)  Once,   URGENT        04/17/22 0141   04/16/22 2316  Blood Cultures x 2 sites  BLOOD CULTURE X 2,   STAT      04/16/22 2316            Vitals/Pain Today's Vitals   04/16/22 2254 04/16/22 2259 04/17/22 0200  BP: (!) 171/78    Pulse: 83    Resp: 17    Temp: 98.7 F (37.1 C)    TempSrc: Oral    SpO2: 96%    Weight:   83 kg  Height:   5\' 7"  (1.702 m)  PainSc:  0-No pain     Isolation Precautions No active isolations  Medications Medications  metroNIDAZOLE (FLAGYL) tablet 500 mg (500 mg Oral Given 04/17/22 0215)  vancomycin (VANCOREADY) IVPB 1250 mg/250 mL (has no administration in time range)  morphine (PF) 4 MG/ML injection 4 mg (has no administration in time range)  atorvastatin (LIPITOR) tablet 40 mg (has no administration in time range)  lisinopril (ZESTRIL) tablet 10 mg (has no administration in time range)  linagliptin (TRADJENTA) tablet 5 mg (has no administration in time range)  enoxaparin (LOVENOX) injection 40 mg (has no administration in time range)  0.9 %  sodium chloride infusion (has no administration in time range)  acetaminophen (TYLENOL) tablet 650 mg (has no administration in time range)    Or  acetaminophen (TYLENOL) suppository 650 mg (has no administration in time range)  traZODone (DESYREL) tablet 25 mg (has no administration in time range)  magnesium hydroxide (MILK OF MAGNESIA) suspension 30 mL (has no administration in time range)  ondansetron (ZOFRAN) tablet 4 mg (has no administration in time range)    Or  ondansetron (ZOFRAN) injection 4 mg (has no administration in time range)  morphine (PF) 2 MG/ML injection 2 mg (has no administration in time range)   ceFEPIme (MAXIPIME) 2 g in sodium chloride 0.9 % 100 mL IVPB (has no administration in time range)    Mobility manual wheelchair     Focused Assessments Diabetic ulcer    R Recommendations: See Admitting Provider Note  Report given to:   Additional Notes:

## 2022-04-17 NOTE — Consult Note (Signed)
ORTHOPAEDIC CONSULTATION  REQUESTING PHYSICIAN: Geradine Girt, DO  Chief Complaint: Purulent drainage plantar aspect left foot.  HPI: Tim Vincent is a 67 y.o. male who presents with uncontrolled type 2 diabetes with hypertension.  Patient has had serial excisions of masses from his elbows.  Past Medical History:  Diagnosis Date   Diabetes mellitus without complication ([place])    Hypertension    Past Surgical History:  Procedure Laterality Date   MASS EXCISION Left 04/17/2021   Procedure: EXCISION LEFT ELBOW MASS;  Surgeon: Cindra Presume, MD;  Location: [place];  Service: Plastics;  Laterality: Left;  1 hour   MASS EXCISION Left 08/10/2021   Procedure: EXCISION ELBOW MASS;  Surgeon: Cindra Presume, MD;  Location: [place];  Service: Plastics;  Laterality: Left;  1 hour   Social History   Socioeconomic History   Marital status: Married    Spouse name: Not on file   Number of children: Not on file   Years of education: Not on file   Highest education level: Not on file  Occupational History   Not on file  Tobacco Use   Smoking status: Never   Smokeless tobacco: Never  Vaping Use   Vaping Use: Never used  Substance and Sexual Activity   Alcohol use: Yes    Alcohol/week: 6.0 standard drinks of alcohol    Types: 6 Cans of beer per week   Drug use: Never   Sexual activity: Not on file  Other Topics Concern   Not on file  Social History Narrative   Not on file   Social Determinants of Health   Financial Resource Strain: Not on file  Food Insecurity: Not on file  Transportation Needs: Not on file  Physical Activity: Not on file  Stress: Not on file  Social Connections: Not on file   No family history on file. - negative except otherwise stated in the family history section No Known Allergies Prior to Admission medications   Medication Sig Start Date End Date Taking? Authorizing Provider  cefdinir (OMNICEF) 300 MG capsule Take  300 mg by mouth 2 (two) times daily. 03/28/22  Yes [provider]  lisinopril (PRINIVIL,ZESTRIL) 10 MG tablet Take 1 tablet (10 mg total) by mouth daily. 10/31/17  Yes Sagardia, Ines Bloomer, MD  TRADJENTA 5 MG TABS tablet Take 5 mg by mouth daily. 03/12/22  Yes [provider]  atorvastatin (LIPITOR) 40 MG tablet Take 40 mg by mouth daily. Patient not taking: Reported on 04/17/2022    [provider]  ondansetron (ZOFRAN-ODT) 4 MG disintegrating tablet Take 1 tablet (4 mg total) by mouth every 8 (eight) hours as needed for nausea or vomiting. 03/22/21   Corena Herter, PA-C   CT TIBIA FIBULA LEFT WO CONTRAST  Result Date: 04/17/2022 CLINICAL DATA:  Necrotizing fasciitis suspected. Left foot wound draining pus x3 days. EXAM: CT OF THE LEFT TIBIA FIBULA WITHOUT CONTRAST; CT OF THE LEFT FOOT WITHOUT CONTRAST TECHNIQUE: Multidetector CT imaging of the lower left extremity was performed from the distal femoral metaphysis through the foot according to the standard protocol. Multiplanar reconstructions were created from the axial data sets. RADIATION DOSE REDUCTION: This exam was performed according to the departmental dose-optimization program which includes automated exposure control, adjustment of the mA and/or kV according to patient size and/or use of iterative reconstruction technique. COMPARISON:  Left foot series from yesterday. FINDINGS: Bones/Joint/Cartilage There is mild tricompartmental degenerative arthrosis of the left knee  and left ankle. There is a popliteal cyst measuring 3.6 x 3.8 x 8.3 cm and containing multiple small osteochondral loose bodies. A small suprapatellar low-density bursal effusion is also noted. There is patellar enthesopathy. Mild spurring of the malleolar undersurfaces is also seen. There are degenerative subcortical cystic changes in medial talar dome. The bone mineralization is normal. No fracture is evident. There is moderate hallux valgus. There is  dorsal dislocation of the second and third toe proximal phalanges in relation to the metatarsals, with dorsal overriding. Relative hyperextension without dislocation of the fourth toe MTP joint. No fracture is evident, but there is gas in the medullary spaces of the second and third metatarsal heads and destructive changes to the dorsal and ventral aspect of the third metatarsal head consistent with osteomyelitis. The imaged left lower extremity otherwise does not show destructive bone changes. There is a chronic healed fracture deformity of the distal fifth metatarsal shaft. There are enthesopathic changes of the calcaneus. Small loose bodies are noted of the medial tibiotalar joint. Moderate hallux valgus is noted as on the foot series. There is mild narrowing and spurring of the first MTP joint. Ligaments Suboptimally assessed by CT. Calcifications are noted both in the ACL and PCL, as well as in the deltoid ligamentous complex in the ankle. Muscles and Tendons There is soft tissue gas in the subcutaneous plane in the anterolateral foreleg beginning just below the level of the knee joint line and continuing inferiorly, with additional patchy soft tissue gas in anterior compartment musculature in the foreleg. A small amount is noted in the intrinsic plantar foot musculature. Findings are consistent with necrotizing fasciitis. The dorsal foreleg musculature does not contain soft tissue gas. The intrinsic muscle bulk is normal except for partial atrophy of the intrinsic plantar foot musculature. There are multifocal tendon calcifications at the level of the knee. There are calcifications in the Achilles tendon and fusiform thickening suggesting chronic calcific tendinopathy. Soft tissues There is diffuse edema in the anterior foreleg and throughout the foot. As above, subcutaneous soft tissue gas is noted in the anterior to anterolateral foreleg and continues into the foot, where it extends over the top of the foot  and circumferentially around the second and third toe metatarsal heads and necks and around the second and third toe proximal phalanges. This is compatible with gas-forming necrotizing infection as well. A gas-filled tract extends to the plantar skin surface where there is a draining open wound at the plantar aspect underlying the area between the second and third metatarsal heads. The popliteal trifurcation arteries are heavily calcified. Heavy vascular calcifications continue into the foot. IMPRESSION: 1. 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 2. A gas-filled tract extends to the plantar skin surface from between the heads of the second and third metatarsals where there is a draining open wound. 3. Soft tissue gas extends over the top of the foot and circumferentially around the second and third toe metatarsal heads and necks and around the second and third toe proximal phalanges, small amount of the intrinsic plantar foot musculature. 4. Gas in the medullary spaces of the second and third metatarsal heads and destructive changes to the dorsal and ventral aspect of the third metatarsal head consistent with osteomyelitis. 5. Dorsal dislocation of the second and third toe proximal phalanges in relation to the metatarsals, with dorsal overriding. 6. Heavy vascular calcifications. 7. Degenerative changes of the left knee, ankle and first  MTP joint, with hallux valgus. 8. Large popliteal cyst containing multiple small osteochondral loose bodies. 9. Calcific tendinopathy/ligamentopathy. Electronically Signed   By: Telford Nab M.D.   On: 04/17/2022 03:59   CT FOOT LEFT WO CONTRAST  Result Date: 04/17/2022 CLINICAL DATA:  Necrotizing fasciitis suspected. Left foot wound draining pus x3 days. EXAM: CT OF THE LEFT TIBIA FIBULA WITHOUT CONTRAST; CT OF THE LEFT FOOT WITHOUT CONTRAST TECHNIQUE: Multidetector CT imaging  of the lower left extremity was performed from the distal femoral metaphysis through the foot according to the standard protocol. Multiplanar reconstructions were created from the axial data sets. RADIATION DOSE REDUCTION: This exam was performed according to the departmental dose-optimization program which includes automated exposure control, adjustment of the mA and/or kV according to patient size and/or use of iterative reconstruction technique. COMPARISON:  Left foot series from yesterday. FINDINGS: Bones/Joint/Cartilage There is mild tricompartmental degenerative arthrosis of the left knee and left ankle. There is a popliteal cyst measuring 3.6 x 3.8 x 8.3 cm and containing multiple small osteochondral loose bodies. A small suprapatellar low-density bursal effusion is also noted. There is patellar enthesopathy. Mild spurring of the malleolar undersurfaces is also seen. There are degenerative subcortical cystic changes in medial talar dome. The bone mineralization is normal. No fracture is evident. There is moderate hallux valgus. There is dorsal dislocation of the second and third toe proximal phalanges in relation to the metatarsals, with dorsal overriding. Relative hyperextension without dislocation of the fourth toe MTP joint. No fracture is evident, but there is gas in the medullary spaces of the second and third metatarsal heads and destructive changes to the dorsal and ventral aspect of the third metatarsal head consistent with osteomyelitis. The imaged left lower extremity otherwise does not show destructive bone changes. There is a chronic healed fracture deformity of the distal fifth metatarsal shaft. There are enthesopathic changes of the calcaneus. Small loose bodies are noted of the medial tibiotalar joint. Moderate hallux valgus is noted as on the foot series. There is mild narrowing and spurring of the first MTP joint. Ligaments Suboptimally assessed by CT. Calcifications are noted both in the ACL  and PCL, as well as in the deltoid ligamentous complex in the ankle. Muscles and Tendons There is soft tissue gas in the subcutaneous plane in the anterolateral foreleg beginning just below the level of the knee joint line and continuing inferiorly, with additional patchy soft tissue gas in anterior compartment musculature in the foreleg. A small amount is noted in the intrinsic plantar foot musculature. Findings are consistent with necrotizing fasciitis. The dorsal foreleg musculature does not contain soft tissue gas. The intrinsic muscle bulk is normal except for partial atrophy of the intrinsic plantar foot musculature. There are multifocal tendon calcifications at the level of the knee. There are calcifications in the Achilles tendon and fusiform thickening suggesting chronic calcific tendinopathy. Soft tissues There is diffuse edema in the anterior foreleg and throughout the foot. As above, subcutaneous soft tissue gas is noted in the anterior to anterolateral foreleg and continues into the foot, where it extends over the top of the foot and circumferentially around the second and third toe metatarsal heads and necks and around the second and third toe proximal phalanges. This is compatible with gas-forming necrotizing infection as well. A gas-filled tract extends to the plantar skin surface where there is a draining open wound at the plantar aspect underlying the area between the second and third metatarsal heads. The popliteal trifurcation arteries are heavily  calcified. Heavy vascular calcifications continue into the foot. IMPRESSION: 1. 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 2. A gas-filled tract extends to the plantar skin surface from between the heads of the second and third metatarsals where there is a draining open wound. 3. Soft tissue gas extends over the top of the foot and circumferentially around  the second and third toe metatarsal heads and necks and around the second and third toe proximal phalanges, small amount of the intrinsic plantar foot musculature. 4. Gas in the medullary spaces of the second and third metatarsal heads and destructive changes to the dorsal and ventral aspect of the third metatarsal head consistent with osteomyelitis. 5. Dorsal dislocation of the second and third toe proximal phalanges in relation to the metatarsals, with dorsal overriding. 6. Heavy vascular calcifications. 7. Degenerative changes of the left knee, ankle and first MTP joint, with hallux valgus. 8. Large popliteal cyst containing multiple small osteochondral loose bodies. 9. Calcific tendinopathy/ligamentopathy. Electronically Signed   By: Telford Nab M.D.   On: 04/17/2022 03:59   DG Foot Complete Left  Result Date: 04/17/2022 CLINICAL DATA:  [id number], wound cellulitis left foot. EXAM: LEFT FOOT - COMPLETE 3+ VIEW COMPARISON:  None Available. FINDINGS: There is diffuse swelling in the distal foreleg and foot. Heavy vascular calcifications. There is scattered soft tissue gas subcutaneously the anterior distal foreleg and superior aspect of the foot and a greater amount in the superior and inferior forefoot, the latter primarily around the second and third toe metatarsophalangeal region. 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 No destructive bone lesion is seen. There is hallux valgus and mild nonerosive degenerative change of the first MTP joint, midfoot arthrosis also noted and small plantar and dorsal calcaneal heel spurs. There is a chronic healed fracture deformity of the distal fifth metatarsal shaft. No acute fracture is seen.  The bone mineralization is normal. IMPRESSION: 1. Diffuse swelling in the distal foreleg and foot. 2. Scattered soft tissue gas in the distal foreleg and superior aspect of the foot, greater soft tissue gas in the forefoot consistent with a  gas-forming infection such as gas gangrene. 3. No destructive bone lesion is seen. 4. Heavy vascular calcifications. 5. Additional chronic changes. Electronically Signed   By: Telford Nab M.D.   On: 04/17/2022 00:47   - pertinent xrays, CT, MRI studies were reviewed and independently interpreted  Positive ROS: All other systems have been reviewed and were otherwise negative with the exception of those mentioned in the HPI and as above.  Physical Exam: General: Alert, no acute distress Psychiatric: Patient is competent for consent with normal mood and affect Lymphatic: No axillary or cervical lymphadenopathy Cardiovascular: No pedal edema Respiratory: No cyanosis, no use of accessory musculature GI: No organomegaly, abdomen is soft and non-tender    Images:  @ENCIMAGES @  Labs:  Lab Results  Component Value Date   HGBA1C 6.3 (H) 04/17/2022    Lab Results  Component Value Date   ALBUMIN 2.5 (L) 04/16/2022        Latest Ref Rng & Units 04/17/2022    3:58 AM 04/16/2022   11:33 PM  CBC EXTENDED  WBC 4.0 - 10.5 K/uL 11.8  13.8   RBC 4.22 - 5.81 MIL/uL 3.71  4.06   Hemoglobin 13.0 - 17.0 g/dL 10.6  11.8   HCT 39.0 - 52.0 % 34.1  36.7   Platelets 150 - 400  K/uL 293  334   NEUT# 1.7 - 7.7 K/uL  12.8   Lymph# 0.7 - 4.0 K/uL  0.4     Neurologic: Patient does not have protective sensation bilateral lower extremities.   MUSCULOSKELETAL:   Skin: Examination patient has fluctuation and cellulitis extending up to the knee.  There is purulent abscess draining from the plantar aspect of the foot.  Patient has a palpable dorsalis pedis pulse.  White cell count 11.8 with a hemoglobin of 10.6.  Hemoglobin A1c 6.3 with an albumin of 2.5.  Review of the CT scan shows air in the soft tissue extending up to the knee with some of the soft tissue proximal to the knee.  Assessment: Assessment: Fluctuant abscess left lower extremity up to the knee.  Plan: Will plan for a left  above-the-knee amputation.  Risk and benefits were discussed including need for additional surgery for further debridement.  Patient states he understands wished to proceed at this time.  Thank you for the consult and the opportunity to see Mr. Tim Vincent, [place] [phone] 8:19 AM

## 2022-04-17 NOTE — Assessment & Plan Note (Signed)
-   We will continue statin therapy. 

## 2022-04-17 NOTE — Assessment & Plan Note (Signed)
-   We will continue his antihypertensives. 

## 2022-04-17 NOTE — Plan of Care (Signed)
  Problem: Health Behavior/Discharge Planning: Goal: Ability to manage health-related needs will improve Outcome: Progressing   Problem: Clinical Measurements: Goal: Cardiovascular complication will be avoided Outcome: Progressing   Problem: Pain Managment: Goal: General experience of comfort will improve Outcome: Progressing   Problem: Safety: Goal: Ability to remain free from injury will improve Outcome: Progressing   Problem: Skin Integrity: Goal: Risk for impaired skin integrity will decrease Outcome: Progressing

## 2022-04-17 NOTE — H&P (Addendum)
[place]   PATIENT NAME: Tim Vincent    MR#:  [id number]  DATE OF BIRTH:  10-01-1955  DATE OF ADMISSION:  04/16/2022  PRIMARY CARE PHYSICIAN: Armanda Heritage, NP   Patient is coming from: Home  REQUESTING/REFERRING PHYSICIAN: Sponseller, Gypsy Balsam, PA-C   CHIEF COMPLAINT:   Chief Complaint  Patient presents with   Diabetic Left Foot Infection   The patient is Spanish-speaking and translation was obtained by our translation software.  HISTORY OF PRESENT ILLNESS:  Tim Vincent is a 67 y.o. male with medical history significant for type 2 diabetes mellitus and hypertension, presented to the emergency room with acute onset of left foot swelling, and duration, warmth with associated erythema and pain since Wednesday.  It was apparently trying to remove a fish eye before this happened.  He admitted to chills but denied any tactile fever.  No nausea or vomiting or diarrhea or abdominal pain.  No chest pain or palpitations.  No cough or wheezing or hemoptysis.  No dysuria, oliguria or hematuria or flank pain.  He denies any headache or dizziness or blurred vision.  ED Course: When the patient came to the ER, BP was 171/78 with otherwise normal vital signs.  Labs revealed potassium of 5.3 and CO2 of 19 with blood glucose of 389 and BUN 81 with creatinine 3.07 slightly higher than previous levels with a calcium of 8.4 albumin of 2.5 and CBC showed leukocytosis 13.8 with neutrophilia and anemia.  Blood cultures were drawn. EKG as reviewed by me : Sinus bradycardia with a rate of 54 with LVH by voltage criteria and T wave inversion in V1. Imaging: Left foot x-ray showed the following: 1. Diffuse swelling in the distal foreleg and foot. 2. Scattered soft tissue gas in the distal foreleg and superior aspect of the foot, greater soft tissue gas in the forefoot consistent with a gas-forming infection such as gas gangrene. 3. No destructive bone lesion is seen. 4. Heavy vascular  calcifications. 5. Additional chronic changes.   Left foot/tib-fib CT revealed the following: 1. 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 2. A gas-filled tract extends to the plantar skin surface from between the heads of the second and third metatarsals where there is a draining open wound. 3. Soft tissue gas extends over the top of the foot and circumferentially around the second and third toe metatarsal heads and necks and around the second and third toe proximal phalanges, small amount of the intrinsic plantar foot musculature. 4. Gas in the medullary spaces of the second and third metatarsal heads and destructive changes to the dorsal and ventral aspect of the third metatarsal head consistent with osteomyelitis. 5. Dorsal dislocation of the second and third toe proximal phalanges in relation to the metatarsals, with dorsal overriding. 6. Heavy vascular calcifications. 7. Degenerative changes of the left knee, ankle and first MTP joint, with hallux valgus. 8. Large popliteal cyst containing multiple small osteochondral loose bodies. 9. Calcific tendinopathy/ligamentopathy.  The patient was given IV vancomycin, ceftriaxone and Flagyl as well as 4 mg of IV morphine sulfate.  He will be admitted to a medical  PAST MEDICAL HISTORY:   Past Medical History:  Diagnosis Date   Diabetes mellitus without complication ([place])    Hypertension     PAST SURGICAL HISTORY:   Past Surgical History:  Procedure Laterality Date   MASS EXCISION Left 04/17/2021   Procedure: EXCISION LEFT ELBOW  MASS;  Surgeon: Allena Napoleon, MD;  Location: [facility];  Service: Plastics;  Laterality: Left;  1 hour   MASS EXCISION Left 08/10/2021   Procedure: EXCISION ELBOW MASS;  Surgeon: Allena Napoleon, MD;  Location: [facility];  Service: Plastics;  Laterality: Left;  1 hour     SOCIAL HISTORY:   Social History   Tobacco Use   Smoking status: Never   Smokeless tobacco: Never  Substance Use Topics   Alcohol use: Yes    Alcohol/week: 6.0 standard drinks of alcohol    Types: 6 Cans of beer per week    FAMILY HISTORY:  No family history on file.  DRUG ALLERGIES:  No Known Allergies  REVIEW OF SYSTEMS:   ROS As per history of present illness. All pertinent systems were reviewed above. Constitutional, HEENT, cardiovascular, respiratory, GI, GU, musculoskeletal, neuro, psychiatric, endocrine, integumentary and hematologic systems were reviewed and are otherwise negative/unremarkable except for positive findings mentioned above in the HPI.   MEDICATIONS AT HOME:   Prior to Admission medications   Medication Sig Start Date End Date Taking? Authorizing Provider  cefdinir (OMNICEF) 300 MG capsule Take 300 mg by mouth 2 (two) times daily. 03/28/22  Yes [provider]  lisinopril (PRINIVIL,ZESTRIL) 10 MG tablet Take 1 tablet (10 mg total) by mouth daily. 10/31/17  Yes Sagardia, Eilleen Kempf, MD  TRADJENTA 5 MG TABS tablet Take 5 mg by mouth daily. 03/12/22  Yes [provider]  atorvastatin (LIPITOR) 40 MG tablet Take 40 mg by mouth daily. Patient not taking: Reported on 04/17/2022    [provider]  ondansetron (ZOFRAN-ODT) 4 MG disintegrating tablet Take 1 tablet (4 mg total) by mouth every 8 (eight) hours as needed for nausea or vomiting. 03/22/21   Lorelee New, PA-C      VITAL SIGNS:  Blood pressure (!) 140/66, pulse 81, temperature 99 F (37.2 C), temperature source Oral, resp. rate 18, height 5\' 7"  (1.702 m), weight 83 kg, SpO2 98 %.  PHYSICAL EXAMINATION:  Physical Exam  GENERAL:  67 y.o.-year-old patient lying in the bed with no acute distress.  EYES: Pupils equal, round, reactive to light and accommodation. No scleral icterus. Extraocular muscles intact.  HEENT: Head atraumatic, normocephalic. Oropharynx and  nasopharynx clear.  NECK:  Supple, no jugular venous distention. No thyroid enlargement, no tenderness.  LUNGS: Normal breath sounds bilaterally, no wheezing, rales,rhonchi or crepitation. No use of accessory muscles of respiration.  CARDIOVASCULAR: Regular rate and rhythm, S1, S2 normal. No murmurs, rubs, or gallops.  ABDOMEN: Soft, nondistended, nontender. Bowel sounds present. No organomegaly or mass.  EXTREMITIES: No pedal edema, cyanosis, or clubbing.  NEUROLOGIC: Cranial nerves II through XII are intact. Muscle strength 5/5 in all extremities. Sensation intact. Gait not checked.  PSYCHIATRIC: The patient is alert and oriented x 3.  Normal affect and good eye contact. SKIN: No obvious rash, lesion, or ulcer.   LABORATORY PANEL:   CBC Recent Labs  Lab 04/17/22 0358  WBC 11.8*  HGB 10.6*  HCT 34.1*  PLT 293   ------------------------------------------------------------------------------------------------------------------  Chemistries  Recent Labs  Lab 04/16/22 2333 04/17/22 0358  NA 136 134*  K 5.3* 4.8  CL 104 105  CO2 19* 15*  GLUCOSE 389* 282*  BUN 81* 84*  CREATININE 3.07* 3.10*  CALCIUM 8.4* 7.9*  AST 27  --   ALT 42  --   ALKPHOS 112  --   BILITOT 0.4  --    ------------------------------------------------------------------------------------------------------------------  Cardiac Enzymes No results for input(s): "TROPONINI" in the last 168 hours. ------------------------------------------------------------------------------------------------------------------  RADIOLOGY:  CT TIBIA FIBULA LEFT WO CONTRAST  Result Date: 04/17/2022 CLINICAL DATA:  Necrotizing fasciitis suspected. Left foot wound draining pus x3 days. EXAM: CT OF THE LEFT TIBIA FIBULA WITHOUT CONTRAST; CT OF THE LEFT FOOT WITHOUT CONTRAST TECHNIQUE: Multidetector CT imaging of the lower left extremity was performed from the distal femoral metaphysis through the foot according to the standard  protocol. Multiplanar reconstructions were created from the axial data sets. RADIATION DOSE REDUCTION: This exam was performed according to the departmental dose-optimization program which includes automated exposure control, adjustment of the mA and/or kV according to patient size and/or use of iterative reconstruction technique. COMPARISON:  Left foot series from yesterday. FINDINGS: Bones/Joint/Cartilage There is mild tricompartmental degenerative arthrosis of the left knee and left ankle. There is a popliteal cyst measuring 3.6 x 3.8 x 8.3 cm and containing multiple small osteochondral loose bodies. A small suprapatellar low-density bursal effusion is also noted. There is patellar enthesopathy. Mild spurring of the malleolar undersurfaces is also seen. There are degenerative subcortical cystic changes in medial talar dome. The bone mineralization is normal. No fracture is evident. There is moderate hallux valgus. There is dorsal dislocation of the second and third toe proximal phalanges in relation to the metatarsals, with dorsal overriding. Relative hyperextension without dislocation of the fourth toe MTP joint. No fracture is evident, but there is gas in the medullary spaces of the second and third metatarsal heads and destructive changes to the dorsal and ventral aspect of the third metatarsal head consistent with osteomyelitis. The imaged left lower extremity otherwise does not show destructive bone changes. There is a chronic healed fracture deformity of the distal fifth metatarsal shaft. There are enthesopathic changes of the calcaneus. Small loose bodies are noted of the medial tibiotalar joint. Moderate hallux valgus is noted as on the foot series. There is mild narrowing and spurring of the first MTP joint. Ligaments Suboptimally assessed by CT. Calcifications are noted both in the ACL and PCL, as well as in the deltoid ligamentous complex in the ankle. Muscles and Tendons There is soft tissue gas in  the subcutaneous plane in the anterolateral foreleg beginning just below the level of the knee joint line and continuing inferiorly, with additional patchy soft tissue gas in anterior compartment musculature in the foreleg. A small amount is noted in the intrinsic plantar foot musculature. Findings are consistent with necrotizing fasciitis. The dorsal foreleg musculature does not contain soft tissue gas. The intrinsic muscle bulk is normal except for partial atrophy of the intrinsic plantar foot musculature. There are multifocal tendon calcifications at the level of the knee. There are calcifications in the Achilles tendon and fusiform thickening suggesting chronic calcific tendinopathy. Soft tissues There is diffuse edema in the anterior foreleg and throughout the foot. As above, subcutaneous soft tissue gas is noted in the anterior to anterolateral foreleg and continues into the foot, where it extends over the top of the foot and circumferentially around the second and third toe metatarsal heads and necks and around the second and third toe proximal phalanges. This is compatible with gas-forming necrotizing infection as well. A gas-filled tract extends to the plantar skin surface where there is a draining open wound at the plantar aspect underlying the area between the second and third metatarsal heads. The popliteal trifurcation arteries are heavily calcified. Heavy vascular calcifications continue into the foot. IMPRESSION: 1. Gas-forming necrotizing fasciitis in the anterior to  anterolateral foreleg and involving the anterior compartment musculature of the foreleg. 2. A gas-filled tract extends to the plantar skin surface from between the heads of the second and third metatarsals where there is a draining open wound. 3. Soft tissue gas extends over the top of the foot and circumferentially around the second and third toe metatarsal heads and necks and around the second and third toe proximal phalanges, small  amount of the intrinsic plantar foot musculature. 4. Gas in the medullary spaces of the second and third metatarsal heads and destructive changes to the dorsal and ventral aspect of the third metatarsal head consistent with osteomyelitis. 5. Dorsal dislocation of the second and third toe proximal phalanges in relation to the metatarsals, with dorsal overriding. 6. Heavy vascular calcifications. 7. Degenerative changes of the left knee, ankle and first MTP joint, with hallux valgus. 8. Large popliteal cyst containing multiple small osteochondral loose bodies. 9. Calcific tendinopathy/ligamentopathy. Electronically Signed   By: Almira Bar M.D.   On: 04/17/2022 03:59   CT FOOT LEFT WO CONTRAST  Result Date: 04/17/2022 CLINICAL DATA:  Necrotizing fasciitis suspected. Left foot wound draining pus x3 days. EXAM: CT OF THE LEFT TIBIA FIBULA WITHOUT CONTRAST; CT OF THE LEFT FOOT WITHOUT CONTRAST TECHNIQUE: Multidetector CT imaging of the lower left extremity was performed from the distal femoral metaphysis through the foot according to the standard protocol. Multiplanar reconstructions were created from the axial data sets. RADIATION DOSE REDUCTION: This exam was performed according to the departmental dose-optimization program which includes automated exposure control, adjustment of the mA and/or kV according to patient size and/or use of iterative reconstruction technique. COMPARISON:  Left foot series from yesterday. FINDINGS: Bones/Joint/Cartilage There is mild tricompartmental degenerative arthrosis of the left knee and left ankle. There is a popliteal cyst measuring 3.6 x 3.8 x 8.3 cm and containing multiple small osteochondral loose bodies. A small suprapatellar low-density bursal effusion is also noted. There is patellar enthesopathy. Mild spurring of the malleolar undersurfaces is also seen. There are degenerative subcortical cystic changes in medial talar dome. The bone mineralization is normal. No fracture  is evident. There is moderate hallux valgus. There is dorsal dislocation of the second and third toe proximal phalanges in relation to the metatarsals, with dorsal overriding. Relative hyperextension without dislocation of the fourth toe MTP joint. No fracture is evident, but there is gas in the medullary spaces of the second and third metatarsal heads and destructive changes to the dorsal and ventral aspect of the third metatarsal head consistent with osteomyelitis. The imaged left lower extremity otherwise does not show destructive bone changes. There is a chronic healed fracture deformity of the distal fifth metatarsal shaft. There are enthesopathic changes of the calcaneus. Small loose bodies are noted of the medial tibiotalar joint. Moderate hallux valgus is noted as on the foot series. There is mild narrowing and spurring of the first MTP joint. Ligaments Suboptimally assessed by CT. Calcifications are noted both in the ACL and PCL, as well as in the deltoid ligamentous complex in the ankle. Muscles and Tendons There is soft tissue gas in the subcutaneous plane in the anterolateral foreleg beginning just below the level of the knee joint line and continuing inferiorly, with additional patchy soft tissue gas in anterior compartment musculature in the foreleg. A small amount is noted in the intrinsic plantar foot musculature. Findings are consistent with necrotizing fasciitis. The dorsal foreleg musculature does not contain soft tissue gas. The intrinsic muscle bulk is normal except for  partial atrophy of the intrinsic plantar foot musculature. There are multifocal tendon calcifications at the level of the knee. There are calcifications in the Achilles tendon and fusiform thickening suggesting chronic calcific tendinopathy. Soft tissues There is diffuse edema in the anterior foreleg and throughout the foot. As above, subcutaneous soft tissue gas is noted in the anterior to anterolateral foreleg and continues  into the foot, where it extends over the top of the foot and circumferentially around the second and third toe metatarsal heads and necks and around the second and third toe proximal phalanges. This is compatible with gas-forming necrotizing infection as well. A gas-filled tract extends to the plantar skin surface where there is a draining open wound at the plantar aspect underlying the area between the second and third metatarsal heads. The popliteal trifurcation arteries are heavily calcified. Heavy vascular calcifications continue into the foot. IMPRESSION: 1. 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 2. A gas-filled tract extends to the plantar skin surface from between the heads of the second and third metatarsals where there is a draining open wound. 3. Soft tissue gas extends over the top of the foot and circumferentially around the second and third toe metatarsal heads and necks and around the second and third toe proximal phalanges, small amount of the intrinsic plantar foot musculature. 4. Gas in the medullary spaces of the second and third metatarsal heads and destructive changes to the dorsal and ventral aspect of the third metatarsal head consistent with osteomyelitis. 5. Dorsal dislocation of the second and third toe proximal phalanges in relation to the metatarsals, with dorsal overriding. 6. Heavy vascular calcifications. 7. Degenerative changes of the left knee, ankle and first MTP joint, with hallux valgus. 8. Large popliteal cyst containing multiple small osteochondral loose bodies. 9. Calcific tendinopathy/ligamentopathy. Electronically Signed   By: Almira Bar M.D.   On: 04/17/2022 03:59   DG Foot Complete Left  Result Date: 04/17/2022 CLINICAL DATA:  [id number], wound cellulitis left foot. EXAM: LEFT FOOT - COMPLETE 3+ VIEW COMPARISON:  None Available. FINDINGS: There is diffuse swelling in the  distal foreleg and foot. Heavy vascular calcifications. There is scattered soft tissue gas subcutaneously the anterior distal foreleg and superior aspect of the foot and a greater amount in the superior and inferior forefoot, the latter primarily around the second and third toe metatarsophalangeal region. 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 No destructive bone lesion is seen. There is hallux valgus and mild nonerosive degenerative change of the first MTP joint, midfoot arthrosis also noted and small plantar and dorsal calcaneal heel spurs. There is a chronic healed fracture deformity of the distal fifth metatarsal shaft. No acute fracture is seen.  The bone mineralization is normal. IMPRESSION: 1. Diffuse swelling in the distal foreleg and foot. 2. Scattered soft tissue gas in the distal foreleg and superior aspect of the foot, greater soft tissue gas in the forefoot consistent with a gas-forming infection such as gas gangrene. 3. No destructive bone lesion is seen. 4. Heavy vascular calcifications. 5. Additional chronic changes. Electronically Signed   By: Almira Bar M.D.   On: 04/17/2022 00:47      IMPRESSION AND PLAN:  Assessment and Plan: Necrotizing fasciitis of lower leg (HCC) - This involves the right lower leg and foot. - The patient will be admitted to a medical bed. - We will continue antibiotic therapy with IV Zyvox and meropenem. -  Pain management will be provided. - Orthopedic consultation will be obtained. - Dr. Marcelino Scot was notified about the patient and Dr. Sharol Given will see him. - He will be kept n.p.o. for now.  Essential hypertension - We will continue his antihypertensives.  Type 2 diabetes mellitus without complication ([place]) - The patient will be placed on supplement coverage with NovoLog. - We will continue his Tradjenta.  Dyslipidemia - We will continue statin therapy.   DVT prophylaxis: Lovenox.  Advanced Care Planning:  Code  Status: full code.  Family Communication:  The plan of care was discussed in details with the patient (and family). I answered all questions. The patient agreed to proceed with the above mentioned plan. Further management will depend upon hospital course. Disposition Plan: Back to previous home environment Consults called: Orthopedic consult. All the records are reviewed and case discussed with ED provider.  Status is: Inpatient   At the time of the admission, it appears that the appropriate admission status for this patient is inpatient.  This is judged to be reasonable and necessary in order to provide the required intensity of service to ensure the patient's safety given the presenting symptoms, physical exam findings and initial radiographic and laboratory data in the context of comorbid conditions.  The patient requires inpatient status due to high intensity of service, high risk of further deterioration and high frequency of surveillance required.  I certify that at the time of admission, it is my clinical judgment that the patient will require inpatient hospital care extending more than 2 midnights.                            Dispo: The patient is from: Home              Anticipated d/c is to: Home              Patient currently is not medically stable to d/c.              Difficult to place patient: No  Christel Mormon M.D on 04/17/2022 at 6:21 AM  [facility]   From 7 PM-7 AM, contact night-coverage [email or web address]  CC: Primary care physician; Armanda Heritage, NP

## 2022-04-17 NOTE — Transfer of Care (Signed)
Immediate Anesthesia Transfer of Care Note  Patient: Tim Vincent  Procedure(s) Performed: LEFT ABOVE KNEE AMPUTATION (Left: Knee)  Patient Location: PACU  Anesthesia Type:General  Level of Consciousness: drowsy and patient cooperative  Airway & Oxygen Therapy: Patient Spontanous Breathing  Post-op Assessment: Report given to RN and Post -op Vital signs reviewed and stable  Post vital signs: Reviewed and stable  Last Vitals:  Vitals Value Taken Time  BP 126/66 04/17/22 1445  Temp    Pulse 117 04/17/22 1446  Resp 27 04/17/22 1446  SpO2 93 % 04/17/22 1446  Vitals shown include unvalidated device data.  Last Pain:  Vitals:   04/17/22 1332  TempSrc:   PainSc: 7       Patients Stated Pain Goal: 2 (38/33/38 3291)  Complications:  Encounter Notable Events  Notable Event Outcome Phase Comment  Difficult to intubate - expected  Intraprocedure Filed from anesthesia note documentation.

## 2022-04-17 NOTE — Progress Notes (Addendum)
Pharmacy Antibiotic Note  Idrees Nasser is a 67 y.o. male admitted on 04/16/2022 with  diabetic foot infection .  Pharmacy has been consulted for vanc dosing.  Pt presented with DFI. He has a hx of CKD.   Addendum  D/w Dr. Sidney Ace, we will optimize abx to linezolid/merrem to cover for DFI and necrotizing fasciitis.  Scr 3  Plan: Linezolid 600mg  IV q12 Merrem 1g IV q12  Height: 5\' 7"  (170.2 cm) Weight: 83 kg (182 lb 15.7 oz) IBW/kg (Calculated) : 66.1  Temp (24hrs), Avg:98.7 F (37.1 C), Min:98.7 F (37.1 C), Max:98.7 F (37.1 C)  Recent Labs  Lab 04/16/22 2333  WBC 13.8*  CREATININE 3.07*  LATICACIDVEN 1.5    Estimated Creatinine Clearance: 24.1 mL/min (A) (by C-G formula based on SCr of 3.07 mg/dL (H)).    No Known Allergies  Antimicrobials this admission: 2/7 ceftriaxonex1 2/7 flagyl>>2/13 2/7 vanc x1 2/7 cefepime x1 2/7 Merrem>> 2/7 linezolid>>  Dose adjustments this admission:   Microbiology results: 2/6 blood>>  Onnie Boer, PharmD, [place], AAHIVP, CPP Infectious Disease Pharmacist 04/17/2022 2:13 AM

## 2022-04-17 NOTE — Assessment & Plan Note (Signed)
-   The patient will be placed on supplement coverage with NovoLog. - We will continue his Tradjenta.

## 2022-04-17 NOTE — ED Provider Notes (Signed)
[facility] Provider Note   CSN: [id number] Arrival date & time: 04/16/22  2226     History  Chief Complaint  Patient presents with   Diabetic Left Foot Infection     Tim Vincent is a 67 y.o. male  Pt complains of wound to the left foot x 1 month now with redness swelling puslike drainage x 3 days with associated chills and bodyaches.  Has not seen anyone for this prior to this so he is currently on cefdinir for prophylaxis following prostate biopsy.   I have reviewed his medical record.  Patient pelvis history is history of hypertension and diabetic neuropathy.  Endorses compliance with all his medications.  Currently on cefdinir for recent prostate biopsy.  HPI     Home Medications Prior to Admission medications   Medication Sig Start Date End Date Taking? Authorizing Provider  cefdinir (OMNICEF) 300 MG capsule Take 300 mg by mouth 2 (two) times daily. 03/28/22  Yes [provider]  lisinopril (PRINIVIL,ZESTRIL) 10 MG tablet Take 1 tablet (10 mg total) by mouth daily. 10/31/17  Yes Sagardia, Ines Bloomer, MD  TRADJENTA 5 MG TABS tablet Take 5 mg by mouth daily. 03/12/22  Yes [provider]  atorvastatin (LIPITOR) 40 MG tablet Take 40 mg by mouth daily. Patient not taking: Reported on 04/17/2022    [provider]  ondansetron (ZOFRAN-ODT) 4 MG disintegrating tablet Take 1 tablet (4 mg total) by mouth every 8 (eight) hours as needed for nausea or vomiting. 03/22/21   Corena Herter, PA-C      Allergies    Patient has no known allergies.    Review of Systems   Review of Systems  Constitutional:  Positive for activity change, appetite change, chills and fatigue.  Musculoskeletal:  Positive for myalgias.  Skin:  Positive for wound.    Physical Exam Updated Vital Signs BP (!) 171/78   Pulse 83   Temp 98.7 F (37.1 C) (Oral)   Resp 17   Ht 5\' 7"  (1.702 m)   Wt 83 kg   SpO2 96%   BMI 28.66 kg/m   Physical Exam Vitals and nursing note reviewed.  Constitutional:      Appearance: He is not ill-appearing or toxic-appearing.  HENT:     Head: Normocephalic and atraumatic.     Mouth/Throat:     Mouth: Mucous membranes are moist.     Pharynx: No oropharyngeal exudate or posterior oropharyngeal erythema.  Eyes:     General:        Right eye: No discharge.        Left eye: No discharge.     Conjunctiva/sclera: Conjunctivae normal.  Cardiovascular:     Rate and Rhythm: Normal rate and regular rhythm.     Pulses: Normal pulses.     Heart sounds: Normal heart sounds. No murmur heard. Pulmonary:     Effort: Pulmonary effort is normal. No respiratory distress.     Breath sounds: Normal breath sounds. No wheezing or rales.  Abdominal:     General: Bowel sounds are normal. There is no distension.     Palpations: Abdomen is soft.     Tenderness: There is no abdominal tenderness. There is no guarding or rebound.  Musculoskeletal:        General: No deformity.     Cervical back: Neck supple.     Right lower leg: No edema.     Left lower leg: No edema.  Feet:  Feet:     Comments: No crepitance to the foot, 1+ pedal pulses. Skin:    General: Skin is warm and dry.     Capillary Refill: Capillary refill takes less than 2 seconds.  Neurological:     General: No focal deficit present.     Mental Status: He is alert and oriented to person, place, and time. Mental status is at baseline.  Psychiatric:        Mood and Affect: Mood normal.        ED Results / Procedures / Treatments   Labs (all labs ordered are listed, but only abnormal results are displayed) Labs Reviewed  COMPREHENSIVE METABOLIC PANEL - Abnormal; Notable for the following components:      Result Value   Potassium 5.3 (*)    CO2 19 (*)    Glucose, Bld 389 (*)    BUN 81 (*)    Creatinine, Ser 3.07 (*)    Calcium 8.4 (*)    Albumin 2.5 (*)    GFR, Estimated 21 (*)    All other components within normal  limits  CBC WITH DIFFERENTIAL/PLATELET - Abnormal; Notable for the following components:   WBC 13.8 (*)    RBC 4.06 (*)    Hemoglobin 11.8 (*)    HCT 36.7 (*)    Neutro Abs 12.8 (*)    Lymphs Abs 0.4 (*)    Abs Immature Granulocytes 0.25 (*)    All other components within normal limits  CULTURE, BLOOD (ROUTINE X 2)  CULTURE, BLOOD (ROUTINE X 2)  LACTIC ACID, PLASMA  HIV ANTIBODY (ROUTINE TESTING W REFLEX)  BASIC METABOLIC PANEL  CBC    EKG None  Radiology DG Foot Complete Left  Result Date: 04/17/2022 CLINICAL DATA:  [id number], wound cellulitis left foot. EXAM: LEFT FOOT - COMPLETE 3+ VIEW COMPARISON:  None Available. FINDINGS: There is diffuse swelling in the distal foreleg and foot. Heavy vascular calcifications. There is scattered soft tissue gas subcutaneously the anterior distal foreleg and superior aspect of the foot and a greater amount in the superior and inferior forefoot, the latter primarily around the second and third toe metatarsophalangeal region. 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 No destructive bone lesion is seen. There is hallux valgus and mild nonerosive degenerative change of the first MTP joint, midfoot arthrosis also noted and small plantar and dorsal calcaneal heel spurs. There is a chronic healed fracture deformity of the distal fifth metatarsal shaft. No acute fracture is seen.  The bone mineralization is normal. IMPRESSION: 1. Diffuse swelling in the distal foreleg and foot. 2. Scattered soft tissue gas in the distal foreleg and superior aspect of the foot, greater soft tissue gas in the forefoot consistent with a gas-forming infection such as gas gangrene. 3. No destructive bone lesion is seen. 4. Heavy vascular calcifications. 5. Additional chronic changes. Electronically Signed   By: Telford Nab M.D.   On: 04/17/2022 00:47    Procedures Procedures    Medications Ordered in ED Medications  metroNIDAZOLE (FLAGYL) tablet  500 mg (500 mg Oral Given 04/17/22 0215)  vancomycin (VANCOREADY) IVPB 1250 mg/250 mL (has no administration in time range)  morphine (PF) 4 MG/ML injection 4 mg (has no administration in time range)  atorvastatin (LIPITOR) tablet 40 mg (has no administration in time range)  lisinopril (ZESTRIL) tablet 10 mg (has no administration in time range)  linagliptin (TRADJENTA) tablet 5 mg (has no administration in time range)  enoxaparin (  LOVENOX) injection 40 mg (has no administration in time range)  0.9 %  sodium chloride infusion (has no administration in time range)  acetaminophen (TYLENOL) tablet 650 mg (has no administration in time range)    Or  acetaminophen (TYLENOL) suppository 650 mg (has no administration in time range)  traZODone (DESYREL) tablet 25 mg (has no administration in time range)  magnesium hydroxide (MILK OF MAGNESIA) suspension 30 mL (has no administration in time range)  ondansetron (ZOFRAN) tablet 4 mg (has no administration in time range)    Or  ondansetron (ZOFRAN) injection 4 mg (has no administration in time range)  morphine (PF) 2 MG/ML injection 2 mg (has no administration in time range)  ceFEPIme (MAXIPIME) 2 g in sodium chloride 0.9 % 100 mL IVPB (has no administration in time range)    ED Course/ Medical Decision Making/ A&P Clinical Course as of 04/17/22 0250  Wed Apr 17, 2022  0146 Consult to orthopedic physician Dr. Marcelino Scot who agrees with plan for CT imaging.  He requests that I had both himself and Dr. Sharol Given to the patient's care team and they will plan to see him later this morning.  Agrees with plan for IV antibiotics as well.  Appreciate his collaboration in the care of this patient. [RS]    Clinical Course User Index [RS] Dominic Rhome, Gypsy Balsam, PA-C                             Medical Decision Making 67 year old male who presents with concern for diabetic foot wound.  Hypertensive on intake vitals normal.  Cardiopulmonary exam, abdominal exam is  benign.  Patient with wound to the left foot as above.  DDx includes limited to diabetic foot infection, necrotizing infection, cellulitis.  See  Amount and/or Complexity of Data Reviewed Labs: ordered.    Details: CBC with leukocytosis of 13.8, mild anemia with hemoglobin of 11.8 CMP with mild elevated creatinine of 5.3, hyperglycemia, creatinine of 3 mildly increasing from patient's baseline of 2.8.  Lactic normal at 1.5.  Radiology: ordered.    Details:  Left foot with soft tissue swelling, scattered soft tissue gas in the distal foreleg superior aspect of the foot.  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 Visualized by this provider.   Risk Prescription drug management. Decision regarding hospitalization.   Consult to orthopedics as above as well hospitalist.  CT of the lower extremity ordered, pending.  Patient admitted to hospital medicine at this time.  IV antibiotics initiated.  Orthopedics to follow.  Tim Vincent and his family voiced understanding of his medical evaluation and treatment plan. Each of their questions answered to their expressed satisfaction.  They are amenable to plan for admission at this time.  This chart was dictated using voice recognition software, Dragon. Despite the best efforts of this provider to proofread and correct errors, errors may still occur which can change documentation meaning.   Final Clinical Impression(s) / ED Diagnoses Final diagnoses:  Diabetic infection of left foot [facility])    Rx / DC Orders ED Discharge Orders     None         Aura Dials 04/17/22 0250    Merryl Hacker, MD 04/17/22 [phone]

## 2022-04-17 NOTE — Assessment & Plan Note (Addendum)
-   This involves the right lower leg and foot. - The patient will be admitted to a medical bed. - We will continue antibiotic therapy with IV Zyvox and meropenem. - Pain management will be provided. - Orthopedic consultation will be obtained. - Dr. Marcelino Scot was notified about the patient and Dr. Sharol Given will see him. - He will be kept n.p.o. for now.

## 2022-04-17 NOTE — Progress Notes (Addendum)
Patient admitted after midnight, please see H&P.  Here with purulent drainage from left foot.  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 Eulogio Bear DO  Addendum: after surgery went into a fib RVR (rate 110s-120s)-- will add IV cardizem x 1 and then PO meds.  Check echo and add tele [place] vasc 2- at least 3 so will need NOAC started when ok with orthopedic surgery

## 2022-04-18 ENCOUNTER — Institutional Professional Consult (permissible substitution): Payer: Medicare Other | Admitting: Plastic Surgery

## 2022-04-18 ENCOUNTER — Inpatient Hospital Stay (HOSPITAL_COMMUNITY): Payer: 59

## 2022-04-18 ENCOUNTER — Other Ambulatory Visit: Payer: Self-pay

## 2022-04-18 ENCOUNTER — Encounter (HOSPITAL_COMMUNITY): Payer: Self-pay | Admitting: Orthopedic Surgery

## 2022-04-18 DIAGNOSIS — E119 Type 2 diabetes mellitus without complications: Secondary | ICD-10-CM | POA: Diagnosis not present

## 2022-04-18 DIAGNOSIS — I4891 Unspecified atrial fibrillation: Secondary | ICD-10-CM | POA: Diagnosis not present

## 2022-04-18 DIAGNOSIS — L089 Local infection of the skin and subcutaneous tissue, unspecified: Secondary | ICD-10-CM | POA: Diagnosis not present

## 2022-04-18 DIAGNOSIS — M726 Necrotizing fasciitis: Secondary | ICD-10-CM | POA: Diagnosis not present

## 2022-04-18 DIAGNOSIS — E11628 Type 2 diabetes mellitus with other skin complications: Secondary | ICD-10-CM | POA: Diagnosis not present

## 2022-04-18 LAB — URINALYSIS, ROUTINE W REFLEX MICROSCOPIC
Bacteria, UA: NONE SEEN
Bilirubin Urine: NEGATIVE
Glucose, UA: 50 mg/dL — AB
Ketones, ur: NEGATIVE mg/dL
Leukocytes,Ua: NEGATIVE
Nitrite: NEGATIVE
Protein, ur: 100 mg/dL — AB
Specific Gravity, Urine: 1.014 (ref 1.005–1.030)
pH: 5 (ref 5.0–8.0)

## 2022-04-18 LAB — BASIC METABOLIC PANEL
Anion gap: 12 (ref 5–15)
Anion gap: 14 (ref 5–15)
BUN: 87 mg/dL — ABNORMAL HIGH (ref 8–23)
BUN: 96 mg/dL — ABNORMAL HIGH (ref 8–23)
CO2: 16 mmol/L — ABNORMAL LOW (ref 22–32)
CO2: 14 mmol/L — ABNORMAL LOW (ref 22–32)
Calcium: 7.1 mg/dL — ABNORMAL LOW (ref 8.9–10.3)
Calcium: 7.4 mg/dL — ABNORMAL LOW (ref 8.9–10.3)
Chloride: 105 mmol/L (ref 98–111)
Chloride: 107 mmol/L (ref 98–111)
Creatinine, Ser: 3.47 mg/dL — ABNORMAL HIGH (ref 0.61–1.24)
Creatinine, Ser: 3.72 mg/dL — ABNORMAL HIGH (ref 0.61–1.24)
GFR, Estimated: 19 mL/min — ABNORMAL LOW (ref >60)
GFR, Estimated: 17 mL/min — ABNORMAL LOW (ref >60)
Glucose, Bld: 403 mg/dL — ABNORMAL HIGH (ref 70–99)
Glucose, Bld: 335 mg/dL — ABNORMAL HIGH (ref 70–99)
Potassium: 5.8 mmol/L — ABNORMAL HIGH (ref 3.5–5.1)
Potassium: 5.1 mmol/L (ref 3.5–5.1)
Sodium: 133 mmol/L — ABNORMAL LOW (ref 135–145)
Sodium: 135 mmol/L (ref 135–145)

## 2022-04-18 LAB — GLUCOSE, CAPILLARY
Glucose-Capillary: 312 mg/dL — ABNORMAL HIGH (ref 70–99)
Glucose-Capillary: 310 mg/dL — ABNORMAL HIGH (ref 70–99)
Glucose-Capillary: 270 mg/dL — ABNORMAL HIGH (ref 70–99)
Glucose-Capillary: 130 mg/dL — ABNORMAL HIGH (ref 70–99)

## 2022-04-18 LAB — CBC
HCT: 30.7 % — ABNORMAL LOW (ref 39.0–52.0)
Hemoglobin: 9.9 g/dL — ABNORMAL LOW (ref 13.0–17.0)
MCH: 29.8 pg (ref 26.0–34.0)
MCHC: 32.2 g/dL (ref 30.0–36.0)
MCV: 92.5 fL (ref 80.0–100.0)
Platelets: 250 10*3/uL (ref 150–400)
RBC: 3.32 MIL/uL — ABNORMAL LOW (ref 4.22–5.81)
RDW: 13.9 % (ref 11.5–15.5)
WBC: 11.4 10*3/uL — ABNORMAL HIGH (ref 4.0–10.5)
nRBC: 0 % (ref 0.0–0.2)

## 2022-04-18 LAB — TSH: TSH: 0.64 u[IU]/mL (ref 0.350–4.500)

## 2022-04-18 LAB — ECHOCARDIOGRAM COMPLETE
Area-P 1/2: 3.32 cm2
Height: 67 in
S' Lateral: 3.1 cm
Weight: 2927.71 oz

## 2022-04-18 LAB — MAGNESIUM: Magnesium: 2.1 mg/dL (ref 1.7–2.4)

## 2022-04-18 MED ORDER — SODIUM ZIRCONIUM CYCLOSILICATE 10 G PO PACK
10.0000 g | PACK | Freq: Every day | ORAL | Status: DC
  Administered 2022-04-18: 10 g via ORAL
  Filled 2022-04-18 (×2): qty 1

## 2022-04-18 MED ORDER — INSULIN GLARGINE-YFGN 100 UNIT/ML ~~LOC~~ SOLN
10.0000 [IU] | Freq: Every day | SUBCUTANEOUS | Status: DC
  Administered 2022-04-18 – 2022-04-19 (×2): 10 [IU] via SUBCUTANEOUS
  Filled 2022-04-18 (×2): qty 0.1

## 2022-04-18 MED ORDER — CHLORPROMAZINE HCL 25 MG PO TABS
25.0000 mg | ORAL_TABLET | Freq: Once | ORAL | Status: DC
  Filled 2022-04-18: qty 1

## 2022-04-18 MED ORDER — SODIUM CHLORIDE 0.9 % IV BOLUS
500.0000 mL | Freq: Once | INTRAVENOUS | Status: AC
  Administered 2022-04-18: 500 mL via INTRAVENOUS

## 2022-04-18 MED ORDER — PIPERACILLIN-TAZOBACTAM 3.375 G IVPB
3.3750 g | Freq: Two times a day (BID) | INTRAVENOUS | Status: DC
  Administered 2022-04-18 – 2022-04-19 (×2): 3.375 g via INTRAVENOUS
  Filled 2022-04-18 (×2): qty 50

## 2022-04-18 NOTE — Progress Notes (Signed)
OT Cancellation Note  Patient Details Name: Dirck Butch MRN: [id number] DOB: [birth year]   Cancelled Treatment:    Reason Eval/Treat Not Completed: Patient at procedure or test/ unavailable. Will check back as time and schedule allow for OT assessment.  Percell Miller Beth Dixon, OTR/L 04/18/2022, 9:54 AM

## 2022-04-18 NOTE — Progress Notes (Signed)
Patient ID: Tim Vincent, male   DOB: Mar 08, 1956, 67 y.o.   MRN: [id number] Patient is a 67 year old gentleman postoperative day 1 left above-the-knee amputation for necrotizing fasciitis of the left leg.  Soft tissue was sent for cultures.  There is no drainage in the wound VAC canister.  Discharge planning based on therapy recommendations.  Tissue cultures are showing gram-negative rods and gram-positive cocci.

## 2022-04-18 NOTE — Evaluation (Addendum)
Occupational Therapy Evaluation Patient Details Name: Tim Vincent MRN: [id number] DOB: 04/07/1955 Today's Date: 04/18/2022   History of Present Illness 67 y.o. male presented to the ED 04/16/22 with acute onset of left foot swelling, and duration, warmth with associated erythema and pain since 04/15/22. Pt found to have necrotizing fascitis. 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 PMH: type 2 diabetes mellitus and hypertension,   Clinical Impression   Pt admitted as above presenting with deficits as listed below (refer to OT problem list). Pt lives in 1 story home with spouse/family with 2-3 STE. Pt reports that he is independent with ADL's and functional mobility transfers prior to this admission.  He is currently min A for functional mobility/transfers using RW and Min A for LB ADL's. He is set-up assist for UB ADL's. He should benefit from acute OT followed by [facility] OT to further assist in maximizing independence with ADL's and home set up recommendations. Pt reports that they do not have 3:1. Will focus next session on toilet/tub transfers & LB ADL's as pt plans to d/c home with family when able. Hospital interpreter services were utilized [phone]) throughout the session.       Recommendations for follow up therapy are one component of a multi-disciplinary discharge planning process, led by the attending physician.  Recommendations may be updated based on patient status, additional functional criteria and insurance authorization.   Follow Up Recommendations  Home health OT     Assistance Recommended at Discharge Intermittent Supervision/Assistance  Patient can return home with the following A little help with walking and/or transfers;A little help with bathing/dressing/bathroom;Help with stairs or ramp for entrance;Assist for transportation    Functional Status Assessment  Patient has had a recent decline in their functional status and demonstrates the ability to make significant improvements in function  in a reasonable and predictable amount of time.  Equipment Recommendations  BSC/3in1, tub bench   Recommendations for Other Services       Precautions / Restrictions Precautions Precautions: Fall Precaution Comments: new L AKA, wound vac Restrictions Weight Bearing Restrictions: Yes LLE Weight Bearing: Non weight bearing      Mobility Bed Mobility Overal bed mobility:  (Pt received up in chair)    Transfers Overall transfer level: Needs assistance Equipment used: Rolling walker (2 wheels) Transfers: Sit to/from Stand Sit to Stand: Min assist     General transfer comment: min A for power up and steadying at [employer]      Balance Overall balance assessment: Needs assistance Sitting-balance support: No upper extremity supported, Feet supported, Feet unsupported Sitting balance-Leahy Scale: Normal   Standing balance support: Bilateral upper extremity supported, Reliant on assistive device for balance, During functional activity Standing balance-Leahy Scale: Poor Standing balance comment: requires BUE support after L AKA, partially in response to change in CoM     ADL either performed or assessed with clinical judgement   ADL Overall ADL's : Needs assistance/impaired Eating/Feeding: Independent;Sitting   Grooming: Set up;Sitting   Upper Body Bathing: Set up;Sitting   Lower Body Bathing: Sitting/lateral leans;Sit to/from stand;Minimal assistance   Upper Body Dressing : Set up;Sitting   Lower Body Dressing: Minimal assistance;Sit to/from stand;Sitting/lateral leans   Toilet Transfer: Minimal assistance;BSC/3in1;Rolling walker (2 wheels) Toilet Transfer Details (indicate cue type and reason): Simulated sit to stand, ambulated 5 steps with RW and back to chair Toileting- Clothing Manipulation and Hygiene: Minimal assistance;Sitting/lateral lean;Sit to/from stand   Tub/Shower Transfer Details (indicate cue type and reason): TBD Functional mobility during ADLs: Min  guard;Minimal assistance;Rolling walker (2 wheels) General ADL Comments: Pt was seen for OT assessment today utilizing hospital interpreter services [phone]) throughout the session. Pt was educated in role of OT and use of DME for safety with transfers. Discussed use of 3:1 over toilet as well as in tub shower (Pt and spouse were educated). Pt plans to d/c home with family support when able, recommend [place] for home assessment and recommendations PRN. Pt/spouse verbalized understanding and were in agreement with this.     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 elbow" Pt with limited end range elbow extension, but flexion WNL's)   Lower Extremity Assessment Lower Extremity Assessment: Defer to PT evaluation LLE Deficits / Details: L AKA   Cervical / Trunk Assessment Cervical / Trunk Assessment: Normal   Communication Communication Communication: Interpreter utilized   Cognition Arousal/Alertness: Awake/alert Behavior During Therapy: WFL for tasks assessed/performed Overall Cognitive Status: Within Functional Limits for tasks assessed     General Comments  Wound vac in place, no drainage in canister, VSS on RA            Home Living Family/patient expects to be discharged to:: Private residence Living Arrangements: Children;Spouse/significant other Available Help at Discharge: Family;Available 24 hours/day Type of Home: House Home Access: Stairs to enter [employer] of Steps: 2-3 Entrance Stairs-Rails: Can reach both Home  Layout: One level   Bathroom Shower/Tub: Teacher, early years/pre: Standard Bathroom Accessibility: Yes   Home Equipment: None      Prior Functioning/Environment Prior Level of Function : Independent/Modified Independent   Mobility Comments: Pt ambulated w/o AD, drives ADLs Comments: Independent per pt reports        OT Problem List: Decreased knowledge of use of DME or AE;Decreased knowledge of precautions;Decreased activity tolerance;Impaired balance (sitting and/or standing);Pain      OT Treatment/Interventions: Self-care/ADL training;Patient/family education;Therapeutic activities;DME and/or AE instruction    OT Goals(Current goals can be found in the care plan section) Acute Rehab OT Goals Patient Stated Goal: Go home with family OT Goal Formulation: With patient/family Time For Goal Achievement: 05/02/22 Potential to Achieve Goals: Good  OT Frequency: Min 2X/week       AM-PAC OT "6 Clicks" Daily Activity     Outcome Measure Help from another person eating meals?: None Help from another person taking care of personal grooming?: A Little Help from another person toileting, which includes using toliet, bedpan, or urinal?: A Little Help from another person bathing (including washing, rinsing, drying)?: A Little Help from another person to put on and taking off regular upper body clothing?: None Help from another person to put on and taking off regular lower body clothing?: A Little 6 Click Score: 20   End of Session Equipment Utilized During Treatment: Gait belt;Rolling walker (2 wheels) Nurse Communication: Other (comment) (Recommend 3:1 and HHOT)  Activity Tolerance: Patient tolerated treatment well Patient left: in chair;with call bell/phone within reach;with chair alarm set  OT Visit Diagnosis: Unsteadiness on feet (R26.81);Other abnormalities of gait and mobility (R26.89);Pain Pain - Right/Left: Left  Pain - part of body: Leg                Time:  4166-0630 OT Time Calculation (min): 18 min Charges:  OT General Charges $OT Visit: 1 Visit OT Evaluation $OT Eval Low Complexity: 1 Low  Maevyn Riordan Beth Dixon, OTR/L 04/18/2022, 10:52 AM

## 2022-04-18 NOTE — Evaluation (Signed)
Physical Therapy Evaluation Patient Details Name: Tim Vincent MRN: [id number] DOB: Jul 12, 1955 Today's Date: 04/18/2022  History of Present Illness  67 y.o. male presented to the ED 04/16/22 with acute onset of left foot swelling, and duration, warmth with associated erythema and pain since 04/15/22. Pt found to have necrotizing fascitis. 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 PMH: type 2 diabetes mellitus and hypertension,  Clinical Impression  PTA pt living with wife and children, in single story home with 2 steps to enter, completely independent. Pt currently limited in safe mobility, by decreased balance, and decreased knowledge of DME. Pt is mod I in bed mobility, min A for transfers and min A for hopping 12 feet with RW. PT recommending HHPT at discharge. PT will follow acutely, and will work on stair training in next session as able.       Recommendations for follow up therapy are one component of a multi-disciplinary discharge planning process, led by the attending physician.  Recommendations may be updated based on patient status, additional functional criteria and insurance authorization.  Follow Up Recommendations Home health PT      Assistance Recommended at Discharge Frequent or constant Supervision/Assistance  Patient can return home with the following  A little help with walking and/or transfers;A little help with bathing/dressing/bathroom;Assistance with cooking/housework;Assist for transportation;Help with stairs or ramp for entrance    Equipment Recommendations Rolling walker (2 wheels);Wheelchair (measurements PT);Wheelchair cushion (measurements PT);Other (comment) (tub bench)  Recommendations for Other Services       Functional Status Assessment Patient has had a recent decline in their functional status and demonstrates the ability to make significant improvements in function in a reasonable and predictable amount of time.     Precautions / Restrictions Precautions Precautions:  Fall Precaution Comments: new L AKA, wound vac Restrictions Weight Bearing Restrictions: Yes LLE Weight Bearing: Non weight bearing      Mobility  Bed Mobility Overal bed mobility: Modified Independent             General bed mobility comments: HoB elevated and use of bedrails to pull to longsitting and then scoot to EoB    Transfers Overall transfer level: Needs assistance Equipment used: Rolling walker (2 wheels) Transfers: Sit to/from Stand Sit to Stand: Min assist           General transfer comment: min A for power up and steadying at [employer]    Ambulation/Gait Ambulation/Gait assistance: Min assist Gait Distance (Feet): 12 Feet Assistive device: Rolling walker (2 wheels) Gait Pattern/deviations: Step-to pattern (hop to pattern) Gait velocity: slowed Gait velocity interpretation: <1.31 ft/sec, indicative of household ambulator Pre-gait activities: weightshifts General Gait Details: hop to pattern,heavy use of UE for advancement of his R LE, (will lower RW for next bout of ambulation to allow for increased triceps activation for power up for hopping)        Balance Overall balance assessment: Needs assistance Sitting-balance support: No upper extremity supported Sitting balance-Leahy Scale: Normal     Standing balance support: Bilateral upper extremity supported, Reliant on assistive device for balance, During functional activity Standing balance-Leahy Scale: Poor Standing balance comment: requires BUE support after L AKA, partially in response to change in CoM                             Pertinent Vitals/Pain Pain Assessment Pain Assessment: No/denies pain    Home Living Family/patient expects to be discharged to:: Private residence Living Arrangements: Children;Spouse/significant other Available  Help at Discharge: Family;Available 24 hours/day Type of Home: House Home Access: Stairs to enter Entrance Stairs-Rails: Can reach both Entrance  Stairs-Number of Steps: 2   Home Layout: One level Home Equipment: BSC/3in1      Prior Function Prior Level of Function : Independent/Modified Independent                     Hand Dominance   Dominant Hand: Right    Extremity/Trunk Assessment   Upper Extremity Assessment Upper Extremity Assessment: Defer to OT evaluation    Lower Extremity Assessment Lower Extremity Assessment: LLE deficits/detail LLE Deficits / Details: L AKA    Cervical / Trunk Assessment Cervical / Trunk Assessment: Normal  Communication   Communication: Interpreter utilized  Cognition Arousal/Alertness: Awake/alert Behavior During Therapy: WFL for tasks assessed/performed Overall Cognitive Status: Within Functional Limits for tasks assessed                                          General Comments General comments (skin integrity, edema, etc.): Wound vac in place, no drainage in canister, VSS on RA        Assessment/Plan    PT Assessment Patient needs continued PT services  PT Problem List Decreased strength;Decreased activity tolerance;Decreased balance;Decreased mobility;Decreased skin integrity       PT Treatment Interventions DME instruction;Gait training;Stair training;Functional mobility training;Therapeutic activities;Therapeutic exercise;Balance training;Cognitive remediation;Patient/family education;Wheelchair mobility training    PT Goals (Current goals can be found in the Care Plan section)  Acute Rehab PT Goals Patient Stated Goal: go home soon PT Goal Formulation: With patient/family Time For Goal Achievement: 05/02/22 Potential to Achieve Goals: Good    Frequency Min 3X/week        AM-PAC PT "6 Clicks" Mobility  Outcome Measure Help needed turning from your back to your side while in a flat bed without using bedrails?: None Help needed moving from lying on your back to sitting on the side of a flat bed without using bedrails?: A Little Help  needed moving to and from a bed to a chair (including a wheelchair)?: A Little Help needed standing up from a chair using your arms (e.g., wheelchair or bedside chair)?: A Little Help needed to walk in hospital room?: A Little Help needed climbing 3-5 steps with a railing? : A Lot 6 Click Score: 18    End of Session Equipment Utilized During Treatment: Gait belt Activity Tolerance: Patient tolerated treatment well Patient left: in chair;with call bell/phone within reach;with chair alarm set;with family/visitor present Nurse Communication: Mobility status PT Visit Diagnosis: Unsteadiness on feet (R26.81);Other abnormalities of gait and mobility (R26.89);Muscle weakness (generalized) (M62.81);Difficulty in walking, not elsewhere classified (R26.2)    Time: 0737-1062 PT Time Calculation (min) (ACUTE ONLY): 28 min   Charges:   PT Evaluation $PT Eval Moderate Complexity: 1 Mod PT Treatments $Gait Training: 8-22 mins        Bodi Palmeri B. Migdalia Dk PT, DPT Acute Rehabilitation Services Please use secure chat or  Call Office [phone]   [employer] 04/18/2022, 10:16 AM

## 2022-04-18 NOTE — Progress Notes (Signed)
  Echocardiogram 2D Echocardiogram has been performed.  Tim Vincent M 04/18/2022, 10:34 AM

## 2022-04-18 NOTE — Progress Notes (Signed)
PROGRESS NOTE    Tim Vincent  [id number] DOB: [birth year] DOA: 04/16/2022 PCP: Armanda Heritage, NP    Brief Narrative:  Tim Vincent is a 67 y.o. male  Pt complains of wound to the left foot x 1 month now with redness swelling puslike drainage x 3 days with associated chills and bodyaches.  Has not seen anyone for this prior to this so he is currently on cefdinir for prophylaxis following prostate biopsy.      Assessment and Plan: Necrotizing fasciitis of lower leg ([place]) - This involves the right lower leg and foot. -s/p AKA by ortho - abx per ortho -culture: Tissue cultures are showing gram-negative rods and gram-positive cocci.   A fib with RVR -echo pending -BB started -plan to start NOAC when ok with ortho -Mali vasc 2: 3+  Essential hypertension - adjust BB meds for HR control  Type 2 diabetes mellitus without complication (HCC) - SSI - We will continue his Tradjenta.  Dyslipidemia - continue statin therapy.  CKD stage IIIB -will need outpatient follow up -recheck and trend  Hyperkalemia -lokelma x 1   DVT prophylaxis: enoxaparin (LOVENOX) injection 30 mg Start: 04/18/22 0800 SCD's Start: 04/17/22 1712    Code Status: Full Code Family Communication: family at bedside  Disposition Plan:  Level of care: Telemetry Cardiac Status is: Inpatient Remains inpatient appropriate because: needs PT, IV abx    Consultants:  ortho   Subjective: Up in chair-- no complaints  Objective: Vitals:   04/17/22 1711 04/17/22 2014 04/18/22 0030 04/18/22 0347  BP: 114/71 110/66 (!) 114/56 116/75  Pulse: 99 85 (!) 59 77  Resp: 18 19 19 19   Temp: 97.6 F (36.4 C) 97.9 F (36.6 C) 97.6 F (36.4 C) 98.2 F (36.8 C)  TempSrc: Oral Oral Oral Oral  SpO2: 94% 96% 94% 98%  Weight:      Height:        Intake/Output Summary (Last 24 hours) at 04/18/2022 1122 Last data filed at 04/18/2022 0600 Gross per 24 hour  Intake 4247.34 ml  Output 850 ml  Net 3397.34 ml    Filed Weights   04/17/22 0200  Weight: 83 kg    Examination:   General: Appearance:     Overweight male in no acute distress     Lungs:     respirations unlabored  Heart:    Normal heart rate. Normal rhythm. No murmurs, rubs, or gallops.    MS:   S/p AKA    Neurologic:   Awake, alert       Data Reviewed: I have personally reviewed following labs and imaging studies  CBC: Recent Labs  Lab 04/16/22 2333 04/17/22 0358 04/18/22 0158  WBC 13.8* 11.8* 11.4*  NEUTROABS 12.8*  --   --   HGB 11.8* 10.6* 9.9*  HCT 36.7* 34.1* 30.7*  MCV 90.4 91.9 92.5  PLT 334 293 [id number]   Basic Metabolic Panel: Recent Labs  Lab 04/16/22 2333 04/17/22 0358 04/18/22 0158  NA 136 134* 133*  K 5.3* 4.8 5.8*  CL 104 105 105  CO2 19* 15* 16*  GLUCOSE 389* 282* 403*  BUN 81* 84* 87*  CREATININE 3.07* 3.10* 3.47*  CALCIUM 8.4* 7.9* 7.1*  MG  --   --  2.1   GFR: Estimated Creatinine Clearance: 21.3 mL/min (A) (by C-G formula based on SCr of 3.47 mg/dL (H)). Liver Function Tests: Recent Labs  Lab 04/16/22 2333  AST 27  ALT 42  ALKPHOS 112  BILITOT 0.4  PROT 7.2  ALBUMIN 2.5*   No results for input(s): "LIPASE", "AMYLASE" in the last 168 hours. No results for input(s): "AMMONIA" in the last 168 hours. Coagulation Profile: No results for input(s): "INR", "PROTIME" in the last 168 hours. Cardiac Enzymes: No results for input(s): "CKTOTAL", "CKMB", "CKMBINDEX", "TROPONINI" in the last 168 hours. BNP (last 3 results) No results for input(s): "PROBNP" in the last 8760 hours. HbA1C: Recent Labs    04/17/22 0358  HGBA1C 6.3*   CBG: Recent Labs  Lab 04/17/22 1220 04/17/22 1443 04/17/22 1722 04/17/22 2038 04/18/22 0837  GLUCAP 102* 117* 198* 347* 312*   Lipid Profile: No results for input(s): "CHOL", "HDL", "LDLCALC", "TRIG", "CHOLHDL", "LDLDIRECT" in the last 72 hours. Thyroid Function Tests: Recent Labs    04/18/22 0158  TSH 0.640   Anemia Panel: No results for  input(s): "VITAMINB12", "FOLATE", "FERRITIN", "TIBC", "IRON", "RETICCTPCT" in the last 72 hours. Sepsis Labs: Recent Labs  Lab 04/16/22 2333  LATICACIDVEN 1.5    Recent Results (from the past 240 hour(s))  Blood Cultures x 2 sites     Status: None (Preliminary result)   Collection Time: 04/16/22 11:33 PM   Specimen: BLOOD  Result Value Ref Range Status   Specimen Description BLOOD BLOOD RIGHT ARM  Final   Special Requests   Final    BOTTLES DRAWN AEROBIC AND ANAEROBIC Blood Culture adequate volume   Culture   Final    NO GROWTH 2 DAYS Performed at [facility], [address], [place], [place] [address]    Report Status PENDING  Incomplete  Blood Cultures x 2 sites     Status: None (Preliminary result)   Collection Time: 04/16/22 11:33 PM   Specimen: BLOOD  Result Value Ref Range Status   Specimen Description BLOOD BLOOD RIGHT ARM  Final   Special Requests   Final    BOTTLES DRAWN AEROBIC AND ANAEROBIC Blood Culture adequate volume   Culture   Final    NO GROWTH 2 DAYS Performed at [facility], [place] [address], [place], [place] [address]    Report Status PENDING  Incomplete  Surgical pcr screen     Status: None   Collection Time: 04/17/22  1:31 PM   Specimen: Nasal Mucosa; Nasal Swab  Result Value Ref Range Status   MRSA, PCR NEGATIVE NEGATIVE Final   Staphylococcus aureus NEGATIVE NEGATIVE Final    Comment: (NOTE) The Xpert SA Assay (FDA approved for NASAL specimens in patients 36 years of age and older), is one component of a comprehensive surveillance program. It is not intended to diagnose infection nor to guide or monitor treatment. Performed at [facility], [place] [address] [place], [place] [address]   Aerobic/Anaerobic Culture w Gram Stain (surgical/deep wound)     Status: None (Preliminary result)   Collection Time: 04/17/22  2:17 PM   Specimen: Wound; Tissue  Result Value Ref Range Status   Specimen Description TISSUE LEFT LEG  Final    Special Requests NONE  Final   Gram Stain   Final    NO WBC SEEN FEW GRAM NEGATIVE RODS FEW GRAM POSITIVE COCCI    Culture   Final    FEW ENTEROCOCCUS FAECALIS CULTURE REINCUBATED FOR BETTER GROWTH SUSCEPTIBILITIES TO FOLLOW Performed at [facility], [place] [address] [place], [place] [address]    Report Status PENDING  Incomplete         Radiology Studies: CT TIBIA FIBULA LEFT WO CONTRAST  Result Date: 04/17/2022 CLINICAL DATA:  Necrotizing fasciitis suspected. Left foot wound draining pus x3 days. EXAM: CT OF THE LEFT TIBIA FIBULA WITHOUT CONTRAST; CT OF THE LEFT FOOT WITHOUT CONTRAST TECHNIQUE: Multidetector CT imaging of the lower left extremity was performed from the distal femoral metaphysis through the foot according to the standard protocol. Multiplanar reconstructions were created from the axial data sets. RADIATION DOSE REDUCTION: This exam was performed according to the departmental dose-optimization program which includes automated exposure control, adjustment of the mA and/or kV according to patient size and/or use of iterative reconstruction technique. COMPARISON:  Left foot series from yesterday. FINDINGS: Bones/Joint/Cartilage There is mild tricompartmental degenerative arthrosis of the left knee and left ankle. There is a popliteal cyst measuring 3.6 x 3.8 x 8.3 cm and containing multiple small osteochondral loose bodies. A small suprapatellar low-density bursal effusion is also noted. There is patellar enthesopathy. Mild spurring of the malleolar undersurfaces is also seen. There are degenerative subcortical cystic changes in medial talar dome. The bone mineralization is normal. No fracture is evident. There is moderate hallux valgus. There is dorsal dislocation of the second and third toe proximal phalanges in relation to the metatarsals, with dorsal overriding. Relative hyperextension without dislocation of the fourth toe MTP joint. No fracture is evident, but there is  gas in the medullary spaces of the second and third metatarsal heads and destructive changes to the dorsal and ventral aspect of the third metatarsal head consistent with osteomyelitis. The imaged left lower extremity otherwise does not show destructive bone changes. There is a chronic healed fracture deformity of the distal fifth metatarsal shaft. There are enthesopathic changes of the calcaneus. Small loose bodies are noted of the medial tibiotalar joint. Moderate hallux valgus is noted as on the foot series. There is mild narrowing and spurring of the first MTP joint. Ligaments Suboptimally assessed by CT. Calcifications are noted both in the ACL and PCL, as well as in the deltoid ligamentous complex in the ankle. Muscles and Tendons There is soft tissue gas in the subcutaneous plane in the anterolateral foreleg beginning just below the level of the knee joint line and continuing inferiorly, with additional patchy soft tissue gas in anterior compartment musculature in the foreleg. A small amount is noted in the intrinsic plantar foot musculature. Findings are consistent with necrotizing fasciitis. The dorsal foreleg musculature does not contain soft tissue gas. The intrinsic muscle bulk is normal except for partial atrophy of the intrinsic plantar foot musculature. There are multifocal tendon calcifications at the level of the knee. There are calcifications in the Achilles tendon and fusiform thickening suggesting chronic calcific tendinopathy. Soft tissues There is diffuse edema in the anterior foreleg and throughout the foot. As above, subcutaneous soft tissue gas is noted in the anterior to anterolateral foreleg and continues into the foot, where it extends over the top of the foot and circumferentially around the second and third toe metatarsal heads and necks and around the second and third toe proximal phalanges. This is compatible with gas-forming necrotizing infection as well. A gas-filled tract extends  to the plantar skin surface where there is a draining open wound at the plantar aspect underlying the area between the second and third metatarsal heads. The popliteal trifurcation arteries are heavily calcified. Heavy vascular calcifications continue into the foot. IMPRESSION: 1. 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 2. A gas-filled tract extends to the plantar skin surface from between  the heads of the second and third metatarsals where there is a draining open wound. 3. Soft tissue gas extends over the top of the foot and circumferentially around the second and third toe metatarsal heads and necks and around the second and third toe proximal phalanges, small amount of the intrinsic plantar foot musculature. 4. Gas in the medullary spaces of the second and third metatarsal heads and destructive changes to the dorsal and ventral aspect of the third metatarsal head consistent with osteomyelitis. 5. Dorsal dislocation of the second and third toe proximal phalanges in relation to the metatarsals, with dorsal overriding. 6. Heavy vascular calcifications. 7. Degenerative changes of the left knee, ankle and first MTP joint, with hallux valgus. 8. Large popliteal cyst containing multiple small osteochondral loose bodies. 9. Calcific tendinopathy/ligamentopathy. Electronically Signed   By: Telford Nab M.D.   On: 04/17/2022 03:59   CT FOOT LEFT WO CONTRAST  Result Date: 04/17/2022 CLINICAL DATA:  Necrotizing fasciitis suspected. Left foot wound draining pus x3 days. EXAM: CT OF THE LEFT TIBIA FIBULA WITHOUT CONTRAST; CT OF THE LEFT FOOT WITHOUT CONTRAST TECHNIQUE: Multidetector CT imaging of the lower left extremity was performed from the distal femoral metaphysis through the foot according to the standard protocol. Multiplanar reconstructions were created from the axial data sets. RADIATION DOSE REDUCTION: This  exam was performed according to the departmental dose-optimization program which includes automated exposure control, adjustment of the mA and/or kV according to patient size and/or use of iterative reconstruction technique. COMPARISON:  Left foot series from yesterday. FINDINGS: Bones/Joint/Cartilage There is mild tricompartmental degenerative arthrosis of the left knee and left ankle. There is a popliteal cyst measuring 3.6 x 3.8 x 8.3 cm and containing multiple small osteochondral loose bodies. A small suprapatellar low-density bursal effusion is also noted. There is patellar enthesopathy. Mild spurring of the malleolar undersurfaces is also seen. There are degenerative subcortical cystic changes in medial talar dome. The bone mineralization is normal. No fracture is evident. There is moderate hallux valgus. There is dorsal dislocation of the second and third toe proximal phalanges in relation to the metatarsals, with dorsal overriding. Relative hyperextension without dislocation of the fourth toe MTP joint. No fracture is evident, but there is gas in the medullary spaces of the second and third metatarsal heads and destructive changes to the dorsal and ventral aspect of the third metatarsal head consistent with osteomyelitis. The imaged left lower extremity otherwise does not show destructive bone changes. There is a chronic healed fracture deformity of the distal fifth metatarsal shaft. There are enthesopathic changes of the calcaneus. Small loose bodies are noted of the medial tibiotalar joint. Moderate hallux valgus is noted as on the foot series. There is mild narrowing and spurring of the first MTP joint. Ligaments Suboptimally assessed by CT. Calcifications are noted both in the ACL and PCL, as well as in the deltoid ligamentous complex in the ankle. Muscles and Tendons There is soft tissue gas in the subcutaneous plane in the anterolateral foreleg beginning just below the level of the knee joint line and  continuing inferiorly, with additional patchy soft tissue gas in anterior compartment musculature in the foreleg. A small amount is noted in the intrinsic plantar foot musculature. Findings are consistent with necrotizing fasciitis. The dorsal foreleg musculature does not contain soft tissue gas. The intrinsic muscle bulk is normal except for partial atrophy of the intrinsic plantar foot musculature. There are multifocal tendon calcifications at the level of the knee. There are calcifications in  the Achilles tendon and fusiform thickening suggesting chronic calcific tendinopathy. Soft tissues There is diffuse edema in the anterior foreleg and throughout the foot. As above, subcutaneous soft tissue gas is noted in the anterior to anterolateral foreleg and continues into the foot, where it extends over the top of the foot and circumferentially around the second and third toe metatarsal heads and necks and around the second and third toe proximal phalanges. This is compatible with gas-forming necrotizing infection as well. A gas-filled tract extends to the plantar skin surface where there is a draining open wound at the plantar aspect underlying the area between the second and third metatarsal heads. The popliteal trifurcation arteries are heavily calcified. Heavy vascular calcifications continue into the foot. IMPRESSION: 1. 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 2. A gas-filled tract extends to the plantar skin surface from between the heads of the second and third metatarsals where there is a draining open wound. 3. Soft tissue gas extends over the top of the foot and circumferentially around the second and third toe metatarsal heads and necks and around the second and third toe proximal phalanges, small amount of the intrinsic plantar foot musculature. 4. Gas in the medullary spaces of the second and third  metatarsal heads and destructive changes to the dorsal and ventral aspect of the third metatarsal head consistent with osteomyelitis. 5. Dorsal dislocation of the second and third toe proximal phalanges in relation to the metatarsals, with dorsal overriding. 6. Heavy vascular calcifications. 7. Degenerative changes of the left knee, ankle and first MTP joint, with hallux valgus. 8. Large popliteal cyst containing multiple small osteochondral loose bodies. 9. Calcific tendinopathy/ligamentopathy. Electronically Signed   By: Telford Nab M.D.   On: 04/17/2022 03:59   DG Foot Complete Left  Result Date: 04/17/2022 CLINICAL DATA:  [id number], wound cellulitis left foot. EXAM: LEFT FOOT - COMPLETE 3+ VIEW COMPARISON:  None Available. FINDINGS: There is diffuse swelling in the distal foreleg and foot. Heavy vascular calcifications. There is scattered soft tissue gas subcutaneously the anterior distal foreleg and superior aspect of the foot and a greater amount in the superior and inferior forefoot, the latter primarily around the second and third toe metatarsophalangeal region. 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 No destructive bone lesion is seen. There is hallux valgus and mild nonerosive degenerative change of the first MTP joint, midfoot arthrosis also noted and small plantar and dorsal calcaneal heel spurs. There is a chronic healed fracture deformity of the distal fifth metatarsal shaft. No acute fracture is seen.  The bone mineralization is normal. IMPRESSION: 1. Diffuse swelling in the distal foreleg and foot. 2. Scattered soft tissue gas in the distal foreleg and superior aspect of the foot, greater soft tissue gas in the forefoot consistent with a gas-forming infection such as gas gangrene. 3. No destructive bone lesion is seen. 4. Heavy vascular calcifications. 5. Additional chronic changes. Electronically Signed   By: Telford Nab M.D.   On: 04/17/2022 00:47         Scheduled Meds:  vitamin C  1,000 mg Oral Daily   atorvastatin  40 mg Oral Daily   docusate sodium  100 mg Oral Daily   enoxaparin (LOVENOX) injection  30 mg Subcutaneous Q24H   influenza vaccine adjuvanted  0.5 mL Intramuscular Tomorrow-1000   insulin aspart  0-15 Units Subcutaneous TID WC   insulin aspart  0-5 Units Subcutaneous  QHS   linagliptin  5 mg Oral Daily   metoprolol tartrate  12.5 mg Oral BID   nutrition supplement (JUVEN)  1 packet Oral BID BM   pantoprazole  40 mg Oral Daily   sodium zirconium cyclosilicate  10 g Oral Daily   zinc sulfate  220 mg Oral Daily   Continuous Infusions:  sodium chloride 75 mL/hr at 04/18/22 0600   linezolid (ZYVOX) IV 600 mg (04/18/22 1015)   magnesium sulfate bolus IVPB     meropenem (MERREM) IV 1 g (04/18/22 0933)     LOS: 1 day    Time spent: 45 minutes spent on chart review, discussion with nursing staff, consultants, updating family and interview/physical exam; more than 50% of that time was spent in counseling and/or coordination of care.    Joseph Art, DO [facility] Available via Epic secure chat 7am-7pm After these hours, please refer to coverage provider listed on [email or web address] 04/18/2022, 11:22 AM

## 2022-04-18 NOTE — Anesthesia Postprocedure Evaluation (Signed)
Anesthesia Post Note  Patient: Tim Vincent  Procedure(s) Performed: LEFT ABOVE KNEE AMPUTATION (Left: Knee)     Patient location during evaluation: PACU Anesthesia Type: General Level of consciousness: awake and alert Pain management: pain level controlled Vital Signs Assessment: post-procedure vital signs reviewed and stable Respiratory status: spontaneous breathing, nonlabored ventilation, respiratory function stable and patient connected to nasal cannula oxygen Cardiovascular status: blood pressure returned to baseline and stable Postop Assessment: no apparent nausea or vomiting Anesthetic complications: yes   Encounter Notable Events  Notable Event Outcome Phase Comment  Difficult to intubate - expected  Intraprocedure Filed from anesthesia note documentation.    Last Vitals:  Vitals:   04/18/22 0347 04/18/22 0800  BP: 116/75   Pulse: 77 82  Resp: 19   Temp: 36.8 C   SpO2: 98% 98%    Last Pain:  Vitals:   04/18/22 0800  TempSrc:   PainSc: 0-No pain                 Santa Lighter

## 2022-04-18 NOTE — Progress Notes (Signed)
IP rehab admissions - Noted PT/OT evaluations completed with recommendations for Home Health therapies.  Patient did well on evals requiring Min assist to walk 12 feet with PT.  Likely will not require an acute inpatient rehab stay.  Call me for questions.  [phone]

## 2022-04-18 NOTE — TOC Initial Note (Signed)
Transition of Care [facility]) - Initial/Assessment Note    Patient Details  Name: Tim Vincent MRN: [id number] Date of Birth: 1955/04/24  Transition of Care [facility]) CM/SW Contact:    Bethena Roys, RN Phone Number: 04/18/2022, 3:59 PM  Clinical Narrative: Case Manager spoke with patient and family with Interpreter Raquel. Case Manager discussed home health services and DME with the patient and family. Medicare.gov list provided to the family- family chose [facility]-unable to accept the patient. Case Manager was able to call [facility] and they will accept the patient. Start of care to begin within 24-48 hours post transition home. Family is agreeable to DME with [employer]: wheelchair, tub bench, and rolling walker. No further needs identified at this time.                  Expected Discharge Plan: [place] Barriers to Discharge: Continued Medical Work up   Patient Goals and CMS Choice Patient states their goals for this hospitalization and ongoing recovery are:: to return home. CMS Medicare.gov Compare Post Acute Care list provided to:: Patient Choice offered to / list presented to : Patient, Adult Children      Expected Discharge Plan and Services In-house Referral: NA Discharge Planning Services: CM Consult Post Acute Care Choice: [place] arrangements for the past 2 months: [place]                 DME Arranged: Tub bench, Lightweight manual wheelchair with seat cushion, Walker rolling DME Agency: [employer] Date DME Agency Contacted: 04/18/22 Time DME Agency Contacted: 36 Representative spoke with at DME Agency: Erasmo Downer HH Arranged: RN, Disease Management, PT [place] Agency: [place] Date [place]: 04/18/22 Time [place]: 1559 Representative spoke with at [place]: [place] Arrangements/Services Living arrangements for the past 2 months: [place] Lives with::  Spouse, Relatives Patient language and need for interpreter reviewed:: Yes Do you feel safe going back to the place where you live?: Yes      Need for Family Participation in Patient Care: Yes (Comment) Care giver support system in place?: Yes (comment)   Criminal Activity/Legal Involvement Pertinent to Current Situation/Hospitalization: No - Comment as needed  Activities of Daily Living Home Assistive Devices/Equipment: None ADL Screening (condition at time of admission) Patient's cognitive ability adequate to safely complete daily activities?: Yes Is the patient deaf or have difficulty hearing?: No Does the patient have difficulty seeing, even when wearing glasses/contacts?: No Does the patient have difficulty concentrating, remembering, or making decisions?: No Patient able to express need for assistance with ADLs?: No Does the patient have difficulty dressing or bathing?: No Independently performs ADLs?: Yes (appropriate for developmental age) Does the patient have difficulty walking or climbing stairs?: No Weakness of Legs: None Weakness of Arms/Hands: None  Permission Sought/Granted Permission sought to share information with : Family Supports, Customer service manager, Case Optician, dispensing granted to share information with : Yes, Verbal Permission Granted     Permission granted to share info w AGENCY: [facility]        Emotional Assessment Appearance:: Appears stated age Attitude/Demeanor/Rapport: Engaged Affect (typically observed): Appropriate   Alcohol / Substance Use: Not Applicable Psych Involvement: No (comment)  Admission diagnosis:  Diabetic infection of left foot ([place]) [I62.703, L08.9] Patient Active Problem List   Diagnosis Date Noted   Diabetic infection of left foot ([place]) 04/17/2022   Necrotizing fasciitis of lower leg ([place]) 04/17/2022  Dyslipidemia 04/17/2022   Essential hypertension 01/12/2021   Obesity 01/12/2021   Skin sensation disturbance  01/12/2021   Type 2 diabetes mellitus without complication ([place]) 29/52/8413   Diabetic neuropathy ([place]) 01/12/2021   Hav (hallux abducto valgus), unspecified laterality 01/12/2021   Cough 10/31/2017   Elevated blood pressure reading 10/31/2017   PCP:  Armanda Heritage, NP Pharmacy:   [id number] [place], [place] [address] Phone: [phone] Fax: [phone]   Social Determinants of Health (SDOH) Social History: SDOH Screenings   Food Insecurity: No Food Insecurity (04/17/2022)  Housing: Low Risk  (04/17/2022)  Transportation Needs: No Transportation Needs (04/17/2022)  Utilities: Not At Risk (04/17/2022)  Tobacco Use: Low Risk  (04/18/2022)    Readmission Risk Interventions     No data to display

## 2022-04-18 NOTE — Inpatient Diabetes Management (Signed)
Inpatient Diabetes Program Recommendations  AACE/ADA: New Consensus Statement on Inpatient Glycemic Control (2015)  Target Ranges:  Prepandial:   less than 140 mg/dL      Peak postprandial:   less than 180 mg/dL (1-2 hours)      Critically ill patients:  140 - 180 mg/dL   Lab Results  Component Value Date   GLUCAP 312 (H) 04/18/2022   HGBA1C 6.3 (H) 04/17/2022    Review of Glycemic Control  Latest Reference Range & Units 04/17/22 08:18 04/17/22 11:18 04/17/22 12:20 04/17/22 14:43 04/17/22 17:22 04/17/22 20:38 04/18/22 08:37  Glucose-Capillary 70 - 99 mg/dL 246 (H) 132 (H) 102 (H) 117 (H) 198 (H) 347 (H) 312 (H)   Diabetes history: DM 2 Outpatient Diabetes medications: Tradjenta 5 mg Daily Current orders for Inpatient glycemic control:  Novolog 0-15 units tid + hs Tradjenta 5 mg Daily  A1c 6.3% on 2/7  Decadron 5 mg given yesterday  Inpatient Diabetes Program Recommendations:    -  Consider one time dose of Semglee 10 units for today due to steroid dose yesterday -  Consider adding Novolog 4 units tid meal coverage if eating >50% of meals.  Thanks,  Tama Headings RN, MSN, BC-ADM Inpatient Diabetes Coordinator Team Pager [phone] (8a-5p)

## 2022-04-19 ENCOUNTER — Other Ambulatory Visit (HOSPITAL_COMMUNITY): Payer: Self-pay

## 2022-04-19 DIAGNOSIS — E11628 Type 2 diabetes mellitus with other skin complications: Secondary | ICD-10-CM | POA: Diagnosis not present

## 2022-04-19 DIAGNOSIS — L089 Local infection of the skin and subcutaneous tissue, unspecified: Secondary | ICD-10-CM | POA: Diagnosis not present

## 2022-04-19 DIAGNOSIS — M726 Necrotizing fasciitis: Secondary | ICD-10-CM | POA: Diagnosis not present

## 2022-04-19 LAB — GLUCOSE, CAPILLARY
Glucose-Capillary: 125 mg/dL — ABNORMAL HIGH (ref 70–99)
Glucose-Capillary: 131 mg/dL — ABNORMAL HIGH (ref 70–99)
Glucose-Capillary: 197 mg/dL — ABNORMAL HIGH (ref 70–99)
Glucose-Capillary: 188 mg/dL — ABNORMAL HIGH (ref 70–99)

## 2022-04-19 LAB — BASIC METABOLIC PANEL
Anion gap: 14 (ref 5–15)
BUN: 106 mg/dL — ABNORMAL HIGH (ref 8–23)
CO2: 13 mmol/L — ABNORMAL LOW (ref 22–32)
Calcium: 7.5 mg/dL — ABNORMAL LOW (ref 8.9–10.3)
Chloride: 108 mmol/L (ref 98–111)
Creatinine, Ser: 3.58 mg/dL — ABNORMAL HIGH (ref 0.61–1.24)
GFR, Estimated: 18 mL/min — ABNORMAL LOW (ref >60)
Glucose, Bld: 174 mg/dL — ABNORMAL HIGH (ref 70–99)
Potassium: 5.1 mmol/L (ref 3.5–5.1)
Sodium: 135 mmol/L (ref 135–145)

## 2022-04-19 LAB — CBC
HCT: 32.4 % — ABNORMAL LOW (ref 39.0–52.0)
Hemoglobin: 10.2 g/dL — ABNORMAL LOW (ref 13.0–17.0)
MCH: 29.1 pg (ref 26.0–34.0)
MCHC: 31.5 g/dL (ref 30.0–36.0)
MCV: 92.6 fL (ref 80.0–100.0)
Platelets: 299 10*3/uL (ref 150–400)
RBC: 3.5 MIL/uL — ABNORMAL LOW (ref 4.22–5.81)
RDW: 13.9 % (ref 11.5–15.5)
WBC: 12.4 10*3/uL — ABNORMAL HIGH (ref 4.0–10.5)
nRBC: 0 % (ref 0.0–0.2)

## 2022-04-19 LAB — SURGICAL PATHOLOGY

## 2022-04-19 MED ORDER — SODIUM BICARBONATE 650 MG PO TABS
650.0000 mg | ORAL_TABLET | Freq: Two times a day (BID) | ORAL | Status: DC
  Administered 2022-04-19 – 2022-04-21 (×5): 650 mg via ORAL
  Filled 2022-04-19 (×5): qty 1

## 2022-04-19 MED ORDER — APIXABAN 5 MG PO TABS
5.0000 mg | ORAL_TABLET | Freq: Two times a day (BID) | ORAL | Status: DC
  Administered 2022-04-19 – 2022-04-21 (×5): 5 mg via ORAL
  Filled 2022-04-19 (×5): qty 1

## 2022-04-19 MED ORDER — METOPROLOL TARTRATE 25 MG PO TABS
25.0000 mg | ORAL_TABLET | Freq: Two times a day (BID) | ORAL | Status: DC
  Administered 2022-04-19 – 2022-04-21 (×5): 25 mg via ORAL
  Filled 2022-04-19 (×5): qty 1

## 2022-04-19 NOTE — TOC Benefit Eligibility Note (Signed)
Patient Advocate Encounter  Insurance verification completed.    The patient is currently admitted and upon discharge could be taking Eliquis 5 mg.  The current 30 day co-pay is $0.00.   The patient is insured through [employer] Medicare Part D   Sayaka Hoeppner, CPHT Pharmacy Patient Advocate Specialist [facility] Patient Advocate Team Direct Number: [phone]  Fax: [phone]       

## 2022-04-19 NOTE — Discharge Instructions (Signed)

## 2022-04-19 NOTE — Progress Notes (Signed)
ANTICOAGULATION CONSULT NOTE - Initial Consult  Pharmacy Consult for apixaban Indication: atrial fibrillation  No Known Allergies  Patient Measurements: Height: 5' 7"$  (170.2 cm) Weight: 83 kg (182 lb 15.7 oz) IBW/kg (Calculated) : 66.1  Vital Signs: Temp: 97.8 F (36.6 C) (02/09 0833) Temp Source: Oral (02/09 0833) BP: 169/102 (02/09 0833) Pulse Rate: 99 (02/09 0833)  Labs: Recent Labs    04/17/22 0358 04/18/22 0158 04/18/22 1034 04/19/22 0217  HGB 10.6* 9.9*  --  10.2*  HCT 34.1* 30.7*  --  32.4*  PLT 293 250  --  299  CREATININE 3.10* 3.47* 3.72* 3.58*    Estimated Creatinine Clearance: 20.6 mL/min (A) (by C-G formula based on SCr of 3.58 mg/dL (H)).   Medical History: Past Medical History:  Diagnosis Date   Diabetes mellitus without complication (HCC)    Hypertension     Medications:  Scheduled:   vitamin C  1,000 mg Oral Daily   atorvastatin  40 mg Oral Daily   chlorproMAZINE  25 mg Oral Once   docusate sodium  100 mg Oral Daily   enoxaparin (LOVENOX) injection  30 mg Subcutaneous Q24H   influenza vaccine adjuvanted  0.5 mL Intramuscular Tomorrow-1000   insulin aspart  0-15 Units Subcutaneous TID WC   insulin aspart  0-5 Units Subcutaneous QHS   linagliptin  5 mg Oral Daily   metoprolol tartrate  25 mg Oral BID   nutrition supplement (JUVEN)  1 packet Oral BID BM   pantoprazole  40 mg Oral Daily   sodium bicarbonate  650 mg Oral BID   zinc sulfate  220 mg Oral Daily    Assessment: 74 yom who presented with wound infection and found to have new onset Afib with RVR (CHADsVASc 3) - no AC PTA.   Okay with ortho to start. Despite Scr>1.5, age<80 and wt>60 kg, start apixaban 5 mg BID. No s/sx of bleeding.   Goal of Therapy:  Monitor platelets by anticoagulation protocol: Yes   Plan:  Start apixaban 5 mg BID  Monitor CBC and s/sx of bleeding  Antonietta Jewel, PharmD, BCCCP Clinical Pharmacist  Phone: [phone] 04/19/2022 12:29 PM  Please check  AMION for all [place] phone numbers After 10:00 PM, call [place] [phone]

## 2022-04-19 NOTE — Progress Notes (Signed)
Inpatient Rehab Admissions Coordinator:  Pt going home with [facility]. AC will sign off.   Gayland Curry, [place], [place] Admissions Coordinator [phone]

## 2022-04-19 NOTE — Care Management Important Message (Signed)
Important Message  Patient Details  Name: Tim Vincent MRN: [id number] Date of Birth: 22-Nov-1955   Medicare Important Message Given:  Yes     [name] 04/19/2022, 8:58 AM

## 2022-04-19 NOTE — Progress Notes (Signed)
PROGRESS NOTE    Tim Vincent  [id number] DOB: [birth year] DOA: 04/16/2022 PCP: Armanda Heritage, NP    Brief Narrative:  Tim Vincent is a 67 y.o. male  Pt complains of wound to the left foot x 1 month now with redness swelling puslike drainage x 3 days with associated chills and bodyaches.  Has not seen anyone for this prior to this so he is currently on cefdinir for prophylaxis following prostate biopsy.      Assessment and Plan: Necrotizing fasciitis of lower leg ([place]) - This involves the right lower leg and foot. 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 But this is from the part that was removed so will d/c abx after speaking with ID pharmacy  A fib with RVR -echo done -BB started- will adjust dose -start NOAC -Mali vasc 2: 3+  Essential hypertension - adjust BB meds for HR control  Type 2 diabetes mellitus without complication ([place]) - SSI - We will continue his Tradjenta.  Dyslipidemia - continue statin therapy.  CKD stage IIIB -will need outpatient follow up -recheck and trend -baseline 2.8-- follow up with PCP  Hyperkalemia -lokelma x 1   DVT prophylaxis: enoxaparin (LOVENOX) injection 30 mg Start: 04/18/22 0800 SCD's Start: 04/17/22 1712    Code Status: Full Code Family Communication: family at bedside  Disposition Plan:  Level of care: Telemetry Cardiac Status is: Inpatient Remains inpatient appropriate because: needs PT, IV abx    Consultants:  ortho   Subjective: No overnight events  Objective: Vitals:   04/18/22 1939 04/19/22 0212 04/19/22 0340 04/19/22 0833  BP: (!) 144/81  (!) 158/97 (!) 169/102  Pulse: 83 (!) 101 100 99  Resp: 18  17 19  $ Temp: 97.6 F (36.4 C)   97.8 F (36.6 C)  TempSrc: Axillary   Oral  SpO2: 100% 97% 97% 97%  Weight:      Height:        Intake/Output Summary (Last 24 hours) at 04/19/2022 1158 Last data filed at 04/19/2022 [id number] Gross per 24 hour   Intake 240 ml  Output 900 ml  Net -660 ml   Filed Weights   04/17/22 0200  Weight: 83 kg    Examination:   General: Appearance:     Overweight male in no acute distress     Lungs:     respirations unlabored  Heart:    irregular  MS:   Left above knee amputation noted.   Neurologic:   Awake, alert, oriented x 3. No apparent focal neurological           defect.        Data Reviewed: I have personally reviewed following labs and imaging studies  CBC: Recent Labs  Lab 04/16/22 2333 04/17/22 0358 04/18/22 0158 04/19/22 0217  WBC 13.8* 11.8* 11.4* 12.4*  NEUTROABS 12.8*  --   --   --   HGB 11.8* 10.6* 9.9* 10.2*  HCT 36.7* 34.1* 30.7* 32.4*  MCV 90.4 91.9 92.5 92.6  PLT 334 293 250 [id number]   Basic Metabolic Panel: Recent Labs  Lab 04/16/22 2333 04/17/22 0358 04/18/22 0158 04/18/22 1034 04/19/22 0217  NA 136 134* 133* 135 135  K 5.3* 4.8 5.8* 5.1 5.1  CL 104 105 105 107 108  CO2 19* 15* 16* 14* 13*  GLUCOSE 389* 282* 403* 335* 174*  BUN 81* 84* 87* 96* 106*  CREATININE 3.07* 3.10* 3.47* 3.72* 3.58*  CALCIUM 8.4* 7.9* 7.1* 7.4* 7.5*  MG  --   --  2.1  --   --    GFR: Estimated Creatinine Clearance: 20.6 mL/min (A) (by C-G formula based on SCr of 3.58 mg/dL (H)). Liver Function Tests: Recent Labs  Lab 04/16/22 2333  AST 27  ALT 42  ALKPHOS 112  BILITOT 0.4  PROT 7.2  ALBUMIN 2.5*   No results for input(s): "LIPASE", "AMYLASE" in the last 168 hours. No results for input(s): "AMMONIA" in the last 168 hours. Coagulation Profile: No results for input(s): "INR", "PROTIME" in the last 168 hours. Cardiac Enzymes: No results for input(s): "CKTOTAL", "CKMB", "CKMBINDEX", "TROPONINI" in the last 168 hours. BNP (last 3 results) No results for input(s): "PROBNP" in the last 8760 hours. HbA1C: Recent Labs    04/17/22 0358  HGBA1C 6.3*   CBG: Recent Labs  Lab 04/18/22 0837 04/18/22 1202 04/18/22 1622 04/18/22 2110 04/19/22 0832  GLUCAP 312* 310* 270*  130* 125*   Lipid Profile: No results for input(s): "CHOL", "HDL", "LDLCALC", "TRIG", "CHOLHDL", "LDLDIRECT" in the last 72 hours. Thyroid Function Tests: Recent Labs    04/18/22 0158  TSH 0.640   Anemia Panel: No results for input(s): "VITAMINB12", "FOLATE", "FERRITIN", "TIBC", "IRON", "RETICCTPCT" in the last 72 hours. Sepsis Labs: Recent Labs  Lab 04/16/22 2333  LATICACIDVEN 1.5    Recent Results (from the past 240 hour(s))  Blood Cultures x 2 sites     Status: None (Preliminary result)   Collection Time: 04/16/22 11:33 PM   Specimen: BLOOD  Result Value Ref Range Status   Specimen Description BLOOD BLOOD RIGHT ARM  Final   Special Requests   Final    BOTTLES DRAWN AEROBIC AND ANAEROBIC Blood Culture adequate volume   Culture   Final    NO GROWTH 3 DAYS Performed at [facility], [address], [place], [place] [address]    Report Status PENDING  Incomplete  Blood Cultures x 2 sites     Status: None (Preliminary result)   Collection Time: 04/16/22 11:33 PM   Specimen: BLOOD  Result Value Ref Range Status   Specimen Description BLOOD BLOOD RIGHT ARM  Final   Special Requests   Final    BOTTLES DRAWN AEROBIC AND ANAEROBIC Blood Culture adequate volume   Culture   Final    NO GROWTH 3 DAYS Performed at [facility], [place] [address], [place], [place] [address]    Report Status PENDING  Incomplete  Surgical pcr screen     Status: None   Collection Time: 04/17/22  1:31 PM   Specimen: Nasal Mucosa; Nasal Swab  Result Value Ref Range Status   MRSA, PCR NEGATIVE NEGATIVE Final   Staphylococcus aureus NEGATIVE NEGATIVE Final    Comment: (NOTE) The Xpert SA Assay (FDA approved for NASAL specimens in patients 10 years of age and older), is one component of a comprehensive surveillance program. It is not intended to diagnose infection nor to guide or monitor treatment. Performed at [facility], [place] [address] [place], [place] [address]    Aerobic/Anaerobic Culture w Gram Stain (surgical/deep wound)     Status: None (Preliminary result)   Collection Time: 04/17/22  2:17 PM   Specimen: Wound; Tissue  Result Value Ref Range Status   Specimen Description TISSUE LEFT LEG  Final   Special Requests NONE  Final   Gram Stain   Final    NO WBC SEEN FEW GRAM NEGATIVE RODS FEW GRAM POSITIVE COCCI    Culture  Final    FEW ENTEROCOCCUS FAECALIS CULTURE REINCUBATED FOR BETTER GROWTH Performed at [facility], [place] [address], [place], [place] [address]    Report Status PENDING  Incomplete   Organism ID, Bacteria ENTEROCOCCUS FAECALIS  Final      Susceptibility   Enterococcus faecalis - MIC*    AMPICILLIN <=2 SENSITIVE Sensitive     VANCOMYCIN 1 SENSITIVE Sensitive     GENTAMICIN SYNERGY SENSITIVE Sensitive     * FEW ENTEROCOCCUS FAECALIS         Radiology Studies: ECHOCARDIOGRAM COMPLETE  Result Date: 04/18/2022    ECHOCARDIOGRAM REPORT   Patient Name:   Tim Vincent Date of Exam: 04/18/2022 Medical Rec #:  [id number]     Height:       67.0 in Accession #:    [id number]    Weight:       183.0 lb Date of Birth:  [birth year]    BSA:          1.947 m Patient Age:    63 years      BP:           126/89 mmHg Patient Gender: M             HR:           86 bpm. Exam Location:  Inpatient Procedure: 2D Echo, Cardiac Doppler and Color Doppler Indications:    Atrial Fibrillation I48.91  History:        Patient has no prior history of Echocardiogram examinations.                 Risk Factors:Hypertension and Diabetes. Left above the knee                 amputation.  Sonographer:    Darlina Sicilian RDCS Referring Phys: [id number] Warnell Rasnic Alison Stalling  Sonographer Comments: Echo performed with patient sitting upright in chair per nursing request. IMPRESSIONS  1. Left ventricular ejection fraction, by estimation, is 55 to 60%. The left ventricle has normal function. The left ventricle has no regional wall motion abnormalities. There is moderate concentric  left ventricular hypertrophy. Left ventricular diastolic parameters are indeterminate.  2. Right ventricular systolic function is normal. The right ventricular size is normal. There is normal pulmonary artery systolic pressure.  3. Left atrial size was mildly dilated.  4. The mitral valve is grossly normal. Trivial mitral valve regurgitation. No evidence of mitral stenosis.  5. The aortic valve is tricuspid. Aortic valve regurgitation is not visualized. No aortic stenosis is present.  6. The inferior vena cava is normal in size with greater than 50% respiratory variability, suggesting right atrial pressure of 3 mmHg. Comparison(s): No prior Echocardiogram. FINDINGS  Left Ventricle: Left ventricular ejection fraction, by estimation, is 55 to 60%. The left ventricle has normal function. The left ventricle has no regional wall motion abnormalities. The left ventricular internal cavity size was normal in size. There is  moderate concentric left ventricular hypertrophy. Left ventricular diastolic function could not be evaluated due to atrial fibrillation. Left ventricular diastolic parameters are indeterminate. Right Ventricle: The right ventricular size is normal. Right ventricular systolic function is normal. There is normal pulmonary artery systolic pressure. The tricuspid regurgitant velocity is 2.08 m/s, and with an assumed right atrial pressure of 3 mmHg,  the estimated right ventricular systolic pressure is 0000000 mmHg. Left Atrium: Left atrial size was mildly dilated. Right Atrium: Right atrial size was normal in size. Pericardium: There is no evidence  of pericardial effusion. Mitral Valve: The mitral valve is grossly normal. Trivial mitral valve regurgitation. No evidence of mitral valve stenosis. Tricuspid Valve: The tricuspid valve is grossly normal. Tricuspid valve regurgitation is mild . No evidence of tricuspid stenosis. Aortic Valve: The aortic valve is tricuspid. Aortic valve regurgitation is not  visualized. No aortic stenosis is present. Pulmonic Valve: The pulmonic valve was grossly normal. Pulmonic valve regurgitation is not visualized. No evidence of pulmonic stenosis. Aorta: The aortic root and ascending aorta are structurally normal, with no evidence of dilitation. Venous: The inferior vena cava is normal in size with greater than 50% respiratory variability, suggesting right atrial pressure of 3 mmHg. IAS/Shunts: The interatrial septum was not well visualized.  LEFT VENTRICLE PLAX 2D LVIDd:         3.90 cm   Diastology LVIDs:         3.10 cm   LV e' medial:    6.67 cm/s LV PW:         1.40 cm   LV E/e' medial:  14.0 LV IVS:        1.40 cm   LV e' lateral:   9.39 cm/s LVOT diam:     2.00 cm   LV E/e' lateral: 9.9 LV SV:         48 LV SV Index:   25 LVOT Area:     3.14 cm  RIGHT VENTRICLE RV S prime:     14.80 cm/s TAPSE (M-mode): 1.6 cm LEFT ATRIUM             Index        RIGHT ATRIUM           Index LA diam:        4.70 cm 2.41 cm/m   RA Area:     13.80 cm LA Vol (A2C):   70.5 ml 36.21 ml/m  RA Volume:   29.70 ml  15.25 ml/m LA Vol (A4C):   73.3 ml 37.64 ml/m LA Biplane Vol: 71.5 ml 36.72 ml/m  AORTIC VALVE LVOT Vmax:   82.60 cm/s LVOT Vmean:  63.700 cm/s LVOT VTI:    0.154 m  AORTA Ao Root diam: 2.90 cm Ao Asc diam:  3.30 cm MITRAL VALVE               TRICUSPID VALVE MV Area (PHT): 3.32 cm    TR Peak grad:   17.3 mmHg MV Decel Time: 229 msec    TR Vmax:        208.00 cm/s MV E velocity: 93.40 cm/s                            SHUNTS                            Systemic VTI:  0.15 m                            Systemic Diam: 2.00 cm Kirk Ruths MD Electronically signed by Kirk Ruths MD Signature Date/Time: 04/18/2022/12:38:43 PM    Final         Scheduled Meds:  vitamin C  1,000 mg Oral Daily   atorvastatin  40 mg Oral Daily   chlorproMAZINE  25 mg Oral Once   docusate sodium  100 mg Oral Daily   enoxaparin (LOVENOX) injection  30 mg  Subcutaneous Q24H   influenza vaccine  adjuvanted  0.5 mL Intramuscular Tomorrow-1000   insulin aspart  0-15 Units Subcutaneous TID WC   insulin aspart  0-5 Units Subcutaneous QHS   insulin glargine-yfgn  10 Units Subcutaneous Daily   linagliptin  5 mg Oral Daily   metoprolol tartrate  25 mg Oral BID   nutrition supplement (JUVEN)  1 packet Oral BID BM   pantoprazole  40 mg Oral Daily   sodium bicarbonate  650 mg Oral BID   zinc sulfate  220 mg Oral Daily   Continuous Infusions:  sodium chloride Stopped (04/18/22 1920)   magnesium sulfate bolus IVPB       LOS: 2 days    Time spent: 45 minutes spent on chart review, discussion with nursing staff, consultants, updating family and interview/physical exam; more than 50% of that time was spent in counseling and/or coordination of care.    Geradine Girt, DO [facility] Available via Epic secure chat 7am-7pm After these hours, please refer to coverage provider listed on [email or web address] 04/19/2022, 11:58 AM

## 2022-04-19 NOTE — Progress Notes (Signed)
Physical Therapy Treatment Patient Details Name: Tim Vincent MRN: [id number] DOB: [birth year] Today's Date: 04/19/2022   History of Present Illness 67 y.o. male presented to the ED 04/16/22 with acute onset of left foot swelling, and duration, warmth with associated erythema and pain since 04/15/22. Pt found to have necrotizing fascitis. 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 PMH: type 2 diabetes mellitus and hypertension,    PT Comments    Patient with limited progress as tachy to 140 getting to EOB and RN advised in bed therapy.  Able to initiate HEP instruction with pt and family as well as educate on method for bumping wheelchair up steps for home entry till ramp is built and for limb wrapping if needed.  Son and family members present and supportive.  PT will continue to follow.  Remains appropriate for HHPT at d/c.  Recommendations for follow up therapy are one component of a multi-disciplinary discharge planning process, led by the attending physician.  Recommendations may be updated based on patient status, additional functional criteria and insurance authorization.  Follow Up Recommendations  Home health PT     Assistance Recommended at Discharge Intermittent Supervision/Assistance  Patient can return home with the following A little help with walking and/or transfers;A little help with bathing/dressing/bathroom;Assistance with cooking/housework;Assist for transportation;Help with stairs or ramp for entrance   Equipment Recommendations  Rolling walker (2 wheels);Wheelchair (measurements PT);Wheelchair cushion (measurements PT);Other (comment) (tub bench)    Recommendations for Other Services       Precautions / Restrictions Precautions Precautions: Fall Precaution Comments: new L AKA, wound vac Restrictions Weight Bearing Restrictions: Yes LLE Weight Bearing: Non weight bearing     Mobility  Bed Mobility Overal bed mobility: Needs Assistance Bed Mobility: Supine to Sit, Sit to Supine      Supine to sit: Min guard Sit to supine: Supervision   General bed mobility comments: assist up to sit with increased time and effort, son providing assistance to scoot forward; to supine able to move on his own    Transfers                   General transfer comment: deferred due to HR into 140's with moving to EOB    Ambulation/Gait                   Stairs             Wheelchair Mobility    Modified Rankin (Stroke Patients Only)       Balance Overall balance assessment: Needs assistance Sitting-balance support: Feet supported Sitting balance-Leahy Scale: Good                                      Cognition Arousal/Alertness: Awake/alert Behavior During Therapy: WFL for tasks assessed/performed Overall Cognitive Status: Within Functional Limits for tasks assessed                                          Exercises Amputee Exercises Gluteal Sets: Strengthening, 5 reps, Both, Supine Towel Squeeze: 5 reps, Strengthening, Both, Supine Hip Extension: Strengthening, Supine, Left Hip ABduction/ADduction: Strengthening, 5 reps, Sidelying, Left    General Comments General comments (skin integrity, edema, etc.): wound van on L LE.  Education provided through [name], video interpreter and through son for HEP with handout given  to son, also provided information on residual limb wrapping and bumping wheelchair up steps as son reports they plan to help with this until ramp can be built      Pertinent Vitals/Pain Pain Assessment Pain Assessment: Faces Faces Pain Scale: Hurts a little bit Pain Location: L leg Pain Descriptors / Indicators: Discomfort, Grimacing Pain Intervention(s): Monitored during session    Home Living                          Prior Function            PT Goals (current goals can now be found in the care plan section) Progress towards PT goals: Not progressing toward goals - comment     Frequency    Min 3X/week      PT Plan Current plan remains appropriate    Co-evaluation              AM-PAC PT "6 Clicks" Mobility   Outcome Measure  Help needed turning from your back to your side while in a flat bed without using bedrails?: None Help needed moving from lying on your back to sitting on the side of a flat bed without using bedrails?: A Little Help needed moving to and from a bed to a chair (including a wheelchair)?: A Little Help needed standing up from a chair using your arms (e.g., wheelchair or bedside chair)?: Total Help needed to walk in hospital room?: Total Help needed climbing 3-5 steps with a railing? : Total 6 Click Score: 13    End of Session Equipment Utilized During Treatment: Gait belt Activity Tolerance: Treatment limited secondary to medical complications (Comment) (tachy to 140, limited to in bed) Patient left: in bed;with call bell/phone within reach;with family/visitor present   PT Visit Diagnosis: Other abnormalities of gait and mobility (R26.89);Muscle weakness (generalized) (M62.81);Difficulty in walking, not elsewhere classified (R26.2)     Time: [id number] PT Time Calculation (min) (ACUTE ONLY): 33 min  Charges:  $Therapeutic Exercise: 8-22 mins $Self Care/Home Management: 8-22                     Magda Kiel, PT Acute Rehabilitation Services [phone] 04/19/2022    Reginia Naas 04/19/2022, 4:18 PM

## 2022-04-19 NOTE — Progress Notes (Signed)
Occupational Therapy Treatment Patient Details Name: Tim Vincent MRN: [id number] DOB: 1955-07-25 Today's Date: 04/19/2022   History of present illness 67 y.o. male presented to the ED 04/16/22 with acute onset of left foot swelling, and duration, warmth with associated erythema and pain since 04/15/22. Pt found to have necrotizing fascitis. 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 PMH: type 2 diabetes mellitus and hypertension,   OT comments  Pt progressing towards OT goals. HR elevated earlier today, but RN providing clearance to see pt and pt up in chair on arrival. Reviewed tub/shower transfer with tub bench with min guard A. Donning socks with supervision in chair and min cues to maintain NWB status. Pt performing chair push ups at end of session. HR max 120. Continue to recommend [place].    Recommendations for follow up therapy are one component of a multi-disciplinary discharge planning process, led by the attending physician.  Recommendations may be updated based on patient status, additional functional criteria and insurance authorization.    Follow Up Recommendations  Home health OT     Assistance Recommended at Discharge Intermittent Supervision/Assistance  Patient can return home with the following  A little help with walking and/or transfers;A little help with bathing/dressing/bathroom;Help with stairs or ramp for entrance;Assist for transportation   Equipment Recommendations  BSC/3in1;Tub/shower bench    Recommendations for Other Services      Precautions / Restrictions Precautions Precautions: Fall Precaution Comments: new L AKA, wound vac Restrictions Weight Bearing Restrictions: Yes LLE Weight Bearing: Non weight bearing       Mobility Bed Mobility Overal bed mobility: Needs Assistance Bed Mobility: Supine to Sit, Sit to Supine     Supine to sit: Min guard Sit to supine: Supervision   General bed mobility comments: up in chair on arrival. Demo all bed mobility during session and back  in chair on departure    Transfers Overall transfer level: Needs assistance Equipment used: Rolling walker (2 wheels) Transfers: Sit to/from Stand Sit to Stand: Min guard           General transfer comment: Min guard A for safety     Balance Overall balance assessment: Needs assistance Sitting-balance support: Feet supported Sitting balance-Leahy Scale: Good     Standing balance support: Bilateral upper extremity supported, Reliant on assistive device for balance, During functional activity Standing balance-Leahy Scale: Poor Standing balance comment: requires BUE support after L AKA, partially in response to change in CoM                           ADL either performed or assessed with clinical judgement   ADL Overall ADL's : Needs assistance/impaired                     Lower Body Dressing: Min guard;Sitting/lateral leans;Sit to/from stand Lower Body Dressing Details (indicate cue type and reason): able to demo how he would perform donning pants; Pt donning sock sitting chair with supervision         Tub/ Shower Transfer: Tub transfer;Min guard;Ambulation;Rolling walker (2 wheels);Tub bench Tub/Shower Transfer Details (indicate cue type and reason): tub bench in box in room. 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 Pt following commands and with good safety awareness.        Exercises Exercises: Other exercises Other Exercises Other Exercises: chair push ups x10    Shoulder Instructions       General Comments wound van on L LE.  Education provided through [name], video interpreter and through son for HEP with handout given to son, also provided information on residual limb wrapping and bumping wheelchair up steps as son reports they plan to help with this until ramp can be built    Pertinent Vitals/ Pain       Pain Assessment Pain Assessment: No/denies pain Pain Intervention(s): Limited activity within patient's tolerance, Monitored during session  Home Living                                          Prior Functioning/Environment              Frequency  Min 2X/week        Progress Toward Goals  OT Goals(current goals can now be found in the care plan section)  Progress towards OT goals: Progressing toward goals  Acute Rehab OT Goals Patient Stated Goal: none stated OT Goal Formulation: With patient/family Time For Goal Achievement: 05/02/22 Potential to Achieve Goals: Good ADL Goals Pt Will Perform Grooming: with modified independence;sitting;standing Pt Will Perform Lower Body Dressing: with modified independence;sit to/from stand;sitting/lateral leans Pt Will Transfer to Toilet: with modified independence;ambulating;bedside commode Pt Will Perform Toileting - Clothing Manipulation and hygiene: with modified independence;sitting/lateral leans;sit to/from stand Pt Will Perform Tub/Shower Transfer: Tub transfer;with caregiver independent in assisting;ambulating;tub bench;3 in 1;rolling walker;with min guard assist  Plan      Co-evaluation                 AM-PAC OT "6 Clicks"  Daily Activity     Outcome Measure   Help from another person eating meals?: None Help from another person taking care of personal grooming?: A Little Help from another person toileting, which includes using toliet, bedpan, or urinal?: A Little Help from another person bathing (including washing, rinsing, drying)?: A Little Help from another person to put on and taking off regular upper body clothing?: None Help from another person to put on and taking off regular lower body clothing?: A Little 6 Click Score: 20    End of Session Equipment Utilized During Treatment: Gait belt;Rolling walker (2 wheels)  OT Visit Diagnosis: Unsteadiness on feet (R26.81);Other abnormalities of gait and mobility (R26.89);Pain Pain - Right/Left: Left Pain - part of body: Leg   Activity Tolerance Patient tolerated treatment well   Patient Left in chair;with call bell/phone within reach;with chair alarm set   Nurse Communication Mobility status  Time: 1325-1350 OT Time Calculation (min): 25 min  Charges: OT General Charges $OT Visit: 1 Visit OT Treatments $Self Care/Home Management : 23-37 mins  Elder Cyphers, OTR/L [facility] Acute Rehabilitation Office: [phone]   Magnus Ivan 04/19/2022, 5:24 PM

## 2022-04-20 DIAGNOSIS — M726 Necrotizing fasciitis: Secondary | ICD-10-CM | POA: Diagnosis not present

## 2022-04-20 DIAGNOSIS — E11628 Type 2 diabetes mellitus with other skin complications: Secondary | ICD-10-CM | POA: Diagnosis not present

## 2022-04-20 DIAGNOSIS — L089 Local infection of the skin and subcutaneous tissue, unspecified: Secondary | ICD-10-CM | POA: Diagnosis not present

## 2022-04-20 LAB — GLUCOSE, CAPILLARY
Glucose-Capillary: 100 mg/dL — ABNORMAL HIGH (ref 70–99)
Glucose-Capillary: 105 mg/dL — ABNORMAL HIGH (ref 70–99)
Glucose-Capillary: 161 mg/dL — ABNORMAL HIGH (ref 70–99)
Glucose-Capillary: 139 mg/dL — ABNORMAL HIGH (ref 70–99)

## 2022-04-20 LAB — BASIC METABOLIC PANEL
Anion gap: 11 (ref 5–15)
BUN: 82 mg/dL — ABNORMAL HIGH (ref 8–23)
CO2: 14 mmol/L — ABNORMAL LOW (ref 22–32)
Calcium: 8 mg/dL — ABNORMAL LOW (ref 8.9–10.3)
Chloride: 114 mmol/L — ABNORMAL HIGH (ref 98–111)
Creatinine, Ser: 3.3 mg/dL — ABNORMAL HIGH (ref 0.61–1.24)
GFR, Estimated: 20 mL/min — ABNORMAL LOW (ref >60)
Glucose, Bld: 109 mg/dL — ABNORMAL HIGH (ref 70–99)
Potassium: 4.9 mmol/L (ref 3.5–5.1)
Sodium: 139 mmol/L (ref 135–145)

## 2022-04-20 MED ORDER — NYSTATIN 100000 UNIT/ML MT SUSP
5.0000 mL | Freq: Four times a day (QID) | OROMUCOSAL | Status: DC
  Administered 2022-04-20 – 2022-04-21 (×6): 500000 [IU] via ORAL
  Filled 2022-04-20 (×6): qty 5

## 2022-04-20 MED ORDER — AMLODIPINE BESYLATE 5 MG PO TABS
5.0000 mg | ORAL_TABLET | Freq: Every day | ORAL | Status: DC
  Administered 2022-04-20 – 2022-04-21 (×2): 5 mg via ORAL
  Filled 2022-04-20 (×2): qty 1

## 2022-04-20 NOTE — Progress Notes (Signed)
PROGRESS NOTE    Tim Vincent  [id number] DOB: September 28, 1955 DOA: 04/16/2022 PCP: Armanda Heritage, NP    Brief Narrative:  Tim Vincent is a 67 y.o. male  Pt complains of wound to the left foot x 1 month now with redness swelling puslike drainage x 3 days with associated chills and bodyaches.  Has not seen anyone for this prior to this so he is currently on cefdinir for prophylaxis following prostate biopsy.      Assessment and Plan: Necrotizing fasciitis of lower leg ([place]) - This involves the right lower leg and foot. 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 But this is from the part that was removed so will d/c abx after speaking with [facility] pharmacy  A fib with RVR -echo done -converted to sinus 2/10 -BB  -start NOAC -Mali vasc 2: 3+  Essential hypertension - adjust  meds for better control  Type 2 diabetes mellitus without complication ([place]) - SSI - continue his Tradjenta.  Dyslipidemia - continue statin therapy.  CKD stage IIIB -will need outpatient follow up -recheck and trend -bicarb while in hospital -baseline 2.8-- follow up with PCP  Hyperkalemia -lokelma x 1  Thrush nystatin  DVT prophylaxis: SCD's Start: 04/17/22 1712 apixaban (ELIQUIS) tablet 5 mg    Code Status: Full Code Family Communication: family at bedside  Disposition Plan:  Level of care: Telemetry Cardiac Status is: Inpatient Remains inpatient appropriate because:monitoring of Cr    Consultants:  ortho   Subjective: Nausea this AM  Objective: Vitals:   04/19/22 0833 04/19/22 2014 04/20/22 0408 04/20/22 0912  BP: (!) 169/102 (!) 153/96 (!) 146/84 (!) 164/75  Pulse: 99 97 65 69  Resp: 19 16 19   $ Temp: 97.8 F (36.6 C) 98.3 F (36.8 C) 98.7 F (37.1 C)   TempSrc: Oral Oral Oral   SpO2: 97% 100% 98%   Weight:      Height:        Intake/Output Summary (Last 24 hours) at 04/20/2022 1029 Last data filed at 04/19/2022  2318 Gross per 24 hour  Intake 1176.3 ml  Output 1200 ml  Net -23.7 ml   Filed Weights   04/17/22 0200  Weight: 83 kg    Examination:  General: Appearance:     Overweight male in no acute distress     Lungs:     respirations unlabored  Heart:    Normal heart rate. Normal rhythm. No murmurs, rubs, or gallops.      Neurologic:   Awake, alert, oriented x 3. No apparent focal neurological           defect.       Data Reviewed: I have personally reviewed following labs and imaging studies  CBC: Recent Labs  Lab 04/16/22 2333 04/17/22 0358 04/18/22 0158 04/19/22 0217  WBC 13.8* 11.8* 11.4* 12.4*  NEUTROABS 12.8*  --   --   --   HGB 11.8* 10.6* 9.9* 10.2*  HCT 36.7* 34.1* 30.7* 32.4*  MCV 90.4 91.9 92.5 92.6  PLT 334 293 250 [id number]   Basic Metabolic Panel: Recent Labs  Lab 04/17/22 0358 04/18/22 0158 04/18/22 1034 04/19/22 0217 04/20/22 0756  NA 134* 133* 135 135 139  K 4.8 5.8* 5.1 5.1 4.9  CL 105 105 107 108 114*  CO2 15* 16* 14* 13* 14*  GLUCOSE 282* 403* 335* 174* 109*  BUN 84* 87* 96* 106* 82*  CREATININE 3.10* 3.47* 3.72* 3.58* 3.30*  CALCIUM 7.9*  7.1* 7.4* 7.5* 8.0*  MG  --  2.1  --   --   --    GFR: Estimated Creatinine Clearance: 22.4 mL/min (A) (by C-G formula based on SCr of 3.3 mg/dL (H)). Liver Function Tests: Recent Labs  Lab 04/16/22 2333  AST 27  ALT 42  ALKPHOS 112  BILITOT 0.4  PROT 7.2  ALBUMIN 2.5*   No results for input(s): "LIPASE", "AMYLASE" in the last 168 hours. No results for input(s): "AMMONIA" in the last 168 hours. Coagulation Profile: No results for input(s): "INR", "PROTIME" in the last 168 hours. Cardiac Enzymes: No results for input(s): "CKTOTAL", "CKMB", "CKMBINDEX", "TROPONINI" in the last 168 hours. BNP (last 3 results) No results for input(s): "PROBNP" in the last 8760 hours. HbA1C: No results for input(s): "HGBA1C" in the last 72 hours.  CBG: Recent Labs  Lab 04/19/22 0832 04/19/22 1212 04/19/22 1556  04/19/22 2142 04/20/22 0829  GLUCAP 125* 131* 197* 188* 100*   Lipid Profile: No results for input(s): "CHOL", "HDL", "LDLCALC", "TRIG", "CHOLHDL", "LDLDIRECT" in the last 72 hours. Thyroid Function Tests: Recent Labs    04/18/22 0158  TSH 0.640   Anemia Panel: No results for input(s): "VITAMINB12", "FOLATE", "FERRITIN", "TIBC", "IRON", "RETICCTPCT" in the last 72 hours. Sepsis Labs: Recent Labs  Lab 04/16/22 2333  LATICACIDVEN 1.5    Recent Results (from the past 240 hour(s))  Blood Cultures x 2 sites     Status: None (Preliminary result)   Collection Time: 04/16/22 11:33 PM   Specimen: BLOOD  Result Value Ref Range Status   Specimen Description BLOOD BLOOD RIGHT ARM  Final   Special Requests   Final    BOTTLES DRAWN AEROBIC AND ANAEROBIC Blood Culture adequate volume   Culture   Final    NO GROWTH 4 DAYS Performed at [facility], [address], [place], [place] [address]    Report Status PENDING  Incomplete  Blood Cultures x 2 sites     Status: None (Preliminary result)   Collection Time: 04/16/22 11:33 PM   Specimen: BLOOD  Result Value Ref Range Status   Specimen Description BLOOD BLOOD RIGHT ARM  Final   Special Requests   Final    BOTTLES DRAWN AEROBIC AND ANAEROBIC Blood Culture adequate volume   Culture   Final    NO GROWTH 4 DAYS Performed at [facility], [place] [address] [place], [place] [address]    Report Status PENDING  Incomplete  Surgical pcr screen     Status: None   Collection Time: 04/17/22  1:31 PM   Specimen: Nasal Mucosa; Nasal Swab  Result Value Ref Range Status   MRSA, PCR NEGATIVE NEGATIVE Final   Staphylococcus aureus NEGATIVE NEGATIVE Final    Comment: (NOTE) The Xpert SA Assay (FDA approved for NASAL specimens in patients 68 years of age and older), is one component of a comprehensive surveillance program. It is not intended to diagnose infection nor to guide or monitor treatment. Performed at [facility],  [place] [address], [place], [place] [address]   Aerobic/Anaerobic Culture w Gram Stain (surgical/deep wound)     Status: None (Preliminary result)   Collection Time: 04/17/22  2:17 PM   Specimen: Wound; Tissue  Result Value Ref Range Status   Specimen Description TISSUE LEFT LEG  Final   Special Requests NONE  Final   Gram Stain   Final    NO WBC SEEN FEW GRAM NEGATIVE RODS FEW GRAM POSITIVE COCCI  Culture   Final    FEW ENTEROCOCCUS FAECALIS FEW STREPTOCOCCUS MITIS/ORALIS CULTURE REINCUBATED FOR BETTER GROWTH Performed at [facility], [place] [address], [place], [place] [address]    Report Status PENDING  Incomplete   Organism ID, Bacteria ENTEROCOCCUS FAECALIS  Final      Susceptibility   Enterococcus faecalis - MIC*    AMPICILLIN <=2 SENSITIVE Sensitive     VANCOMYCIN 1 SENSITIVE Sensitive     GENTAMICIN SYNERGY SENSITIVE Sensitive     * FEW ENTEROCOCCUS FAECALIS         Radiology Studies: ECHOCARDIOGRAM COMPLETE  Result Date: 04/18/2022    ECHOCARDIOGRAM REPORT   Patient Name:   Tim Vincent Date of Exam: 04/18/2022 Medical Rec #:  [id number]     Height:       67.0 in Accession #:    [id number]    Weight:       183.0 lb Date of Birth:  Dec 01, 1955    BSA:          1.947 m Patient Age:    66 years      BP:           126/89 mmHg Patient Gender: M             HR:           86 bpm. Exam Location:  Inpatient Procedure: 2D Echo, Cardiac Doppler and Color Doppler Indications:    Atrial Fibrillation I48.91  History:        Patient has no prior history of Echocardiogram examinations.                 Risk Factors:Hypertension and Diabetes. Left above the knee                 amputation.  Sonographer:    Darlina Sicilian RDCS Referring Phys: [phone] Ezzie Senat Alison Stalling  Sonographer Comments: Echo performed with patient sitting upright in chair per nursing request. IMPRESSIONS  1. Left ventricular ejection fraction, by estimation, is 55 to 60%. The left ventricle has normal function. The left ventricle  has no regional wall motion abnormalities. There is moderate concentric left ventricular hypertrophy. Left ventricular diastolic parameters are indeterminate.  2. Right ventricular systolic function is normal. The right ventricular size is normal. There is normal pulmonary artery systolic pressure.  3. Left atrial size was mildly dilated.  4. The mitral valve is grossly normal. Trivial mitral valve regurgitation. No evidence of mitral stenosis.  5. The aortic valve is tricuspid. Aortic valve regurgitation is not visualized. No aortic stenosis is present.  6. The inferior vena cava is normal in size with greater than 50% respiratory variability, suggesting right atrial pressure of 3 mmHg. Comparison(s): No prior Echocardiogram. FINDINGS  Left Ventricle: Left ventricular ejection fraction, by estimation, is 55 to 60%. The left ventricle has normal function. The left ventricle has no regional wall motion abnormalities. The left ventricular internal cavity size was normal in size. There is  moderate concentric left ventricular hypertrophy. Left ventricular diastolic function could not be evaluated due to atrial fibrillation. Left ventricular diastolic parameters are indeterminate. Right Ventricle: The right ventricular size is normal. Right ventricular systolic function is normal. There is normal pulmonary artery systolic pressure. The tricuspid regurgitant velocity is 2.08 m/s, and with an assumed right atrial pressure of 3 mmHg,  the estimated right ventricular systolic pressure is 0000000 mmHg. Left Atrium: Left atrial size was mildly dilated. Right Atrium: Right atrial size was normal in  size. Pericardium: There is no evidence of pericardial effusion. Mitral Valve: The mitral valve is grossly normal. Trivial mitral valve regurgitation. No evidence of mitral valve stenosis. Tricuspid Valve: The tricuspid valve is grossly normal. Tricuspid valve regurgitation is mild . No evidence of tricuspid stenosis. Aortic Valve: The  aortic valve is tricuspid. Aortic valve regurgitation is not visualized. No aortic stenosis is present. Pulmonic Valve: The pulmonic valve was grossly normal. Pulmonic valve regurgitation is not visualized. No evidence of pulmonic stenosis. Aorta: The aortic root and ascending aorta are structurally normal, with no evidence of dilitation. Venous: The inferior vena cava is normal in size with greater than 50% respiratory variability, suggesting right atrial pressure of 3 mmHg. IAS/Shunts: The interatrial septum was not well visualized.  LEFT VENTRICLE PLAX 2D LVIDd:         3.90 cm   Diastology LVIDs:         3.10 cm   LV e' medial:    6.67 cm/s LV PW:         1.40 cm   LV E/e' medial:  14.0 LV IVS:        1.40 cm   LV e' lateral:   9.39 cm/s LVOT diam:     2.00 cm   LV E/e' lateral: 9.9 LV SV:         48 LV SV Index:   25 LVOT Area:     3.14 cm  RIGHT VENTRICLE RV S prime:     14.80 cm/s TAPSE (M-mode): 1.6 cm LEFT ATRIUM             Index        RIGHT ATRIUM           Index LA diam:        4.70 cm 2.41 cm/m   RA Area:     13.80 cm LA Vol (A2C):   70.5 ml 36.21 ml/m  RA Volume:   29.70 ml  15.25 ml/m LA Vol (A4C):   73.3 ml 37.64 ml/m LA Biplane Vol: 71.5 ml 36.72 ml/m  AORTIC VALVE LVOT Vmax:   82.60 cm/s LVOT Vmean:  63.700 cm/s LVOT VTI:    0.154 m  AORTA Ao Root diam: 2.90 cm Ao Asc diam:  3.30 cm MITRAL VALVE               TRICUSPID VALVE MV Area (PHT): 3.32 cm    TR Peak grad:   17.3 mmHg MV Decel Time: 229 msec    TR Vmax:        208.00 cm/s MV E velocity: 93.40 cm/s                            SHUNTS                            Systemic VTI:  0.15 m                            Systemic Diam: 2.00 cm Kirk Ruths MD Electronically signed by Kirk Ruths MD Signature Date/Time: 04/18/2022/12:38:43 PM    Final         Scheduled Meds:  amLODipine  5 mg Oral Daily   apixaban  5 mg Oral BID   vitamin C  1,000 mg Oral Daily   atorvastatin  40 mg Oral Daily   docusate  sodium  100 mg Oral Daily    influenza vaccine adjuvanted  0.5 mL Intramuscular Tomorrow-1000   insulin aspart  0-15 Units Subcutaneous TID WC   insulin aspart  0-5 Units Subcutaneous QHS   linagliptin  5 mg Oral Daily   metoprolol tartrate  25 mg Oral BID   nutrition supplement (JUVEN)  1 packet Oral BID BM   nystatin  5 mL Oral QID   pantoprazole  40 mg Oral Daily   sodium bicarbonate  650 mg Oral BID   zinc sulfate  220 mg Oral Daily   Continuous Infusions:  magnesium sulfate bolus IVPB       LOS: 3 days    Time spent: 45 minutes spent on chart review, discussion with nursing staff, consultants, updating family and interview/physical exam; more than 50% of that time was spent in counseling and/or coordination of care.    Geradine Girt, DO [facility] Available via Epic secure chat 7am-7pm After these hours, please refer to coverage provider listed on [email or web address] 04/20/2022, 10:29 AM

## 2022-04-20 NOTE — Progress Notes (Signed)
Pt converted to NSR

## 2022-04-21 DIAGNOSIS — M1A9XX1 Chronic gout, unspecified, with tophus (tophi): Secondary | ICD-10-CM | POA: Diagnosis not present

## 2022-04-21 DIAGNOSIS — M726 Necrotizing fasciitis: Secondary | ICD-10-CM | POA: Diagnosis not present

## 2022-04-21 DIAGNOSIS — Z794 Long term (current) use of insulin: Secondary | ICD-10-CM | POA: Diagnosis not present

## 2022-04-21 DIAGNOSIS — E119 Type 2 diabetes mellitus without complications: Secondary | ICD-10-CM | POA: Diagnosis not present

## 2022-04-21 LAB — CBC
HCT: 35.8 % — ABNORMAL LOW (ref 39.0–52.0)
Hemoglobin: 11.1 g/dL — ABNORMAL LOW (ref 13.0–17.0)
MCH: 28.7 pg (ref 26.0–34.0)
MCHC: 31 g/dL (ref 30.0–36.0)
MCV: 92.5 fL (ref 80.0–100.0)
Platelets: 304 10*3/uL (ref 150–400)
RBC: 3.87 MIL/uL — ABNORMAL LOW (ref 4.22–5.81)
RDW: 13.7 % (ref 11.5–15.5)
WBC: 10.6 10*3/uL — ABNORMAL HIGH (ref 4.0–10.5)
nRBC: 0 % (ref 0.0–0.2)

## 2022-04-21 LAB — BASIC METABOLIC PANEL
Anion gap: 11 (ref 5–15)
BUN: 68 mg/dL — ABNORMAL HIGH (ref 8–23)
CO2: 17 mmol/L — ABNORMAL LOW (ref 22–32)
Calcium: 8.2 mg/dL — ABNORMAL LOW (ref 8.9–10.3)
Chloride: 111 mmol/L (ref 98–111)
Creatinine, Ser: 2.89 mg/dL — ABNORMAL HIGH (ref 0.61–1.24)
GFR, Estimated: 23 mL/min — ABNORMAL LOW (ref >60)
Glucose, Bld: 99 mg/dL (ref 70–99)
Potassium: 4.9 mmol/L (ref 3.5–5.1)
Sodium: 139 mmol/L (ref 135–145)

## 2022-04-21 LAB — CULTURE, BLOOD (ROUTINE X 2)
Culture: NO GROWTH
Culture: NO GROWTH
Special Requests: ADEQUATE
Special Requests: ADEQUATE

## 2022-04-21 LAB — AEROBIC/ANAEROBIC CULTURE W GRAM STAIN (SURGICAL/DEEP WOUND): Gram Stain: NONE SEEN

## 2022-04-21 LAB — GLUCOSE, CAPILLARY
Glucose-Capillary: 89 mg/dL (ref 70–99)
Glucose-Capillary: 160 mg/dL — ABNORMAL HIGH (ref 70–99)

## 2022-04-21 LAB — URIC ACID: Uric Acid, Serum: 7.8 mg/dL (ref 3.7–8.6)

## 2022-04-21 MED ORDER — BISACODYL 5 MG PO TBEC: 5.0000 mg | DELAYED_RELEASE_TABLET | Freq: Every day | ORAL | 0 refills | Status: AC | PRN

## 2022-04-21 MED ORDER — METOPROLOL TARTRATE 25 MG PO TABS: 25.0000 mg | ORAL_TABLET | Freq: Two times a day (BID) | ORAL | 1 refills | Status: AC

## 2022-04-21 MED ORDER — ACETAMINOPHEN 325 MG PO TABS: 325.0000 mg | ORAL_TABLET | Freq: Four times a day (QID) | ORAL | Status: AC | PRN

## 2022-04-21 MED ORDER — PREDNISONE 10 MG PO TABS: ORAL_TABLET | ORAL | Status: DC

## 2022-04-21 MED ORDER — OXYCODONE HCL 5 MG PO TABS: 5.0000 mg | ORAL_TABLET | ORAL | 0 refills | Status: DC | PRN

## 2022-04-21 MED ORDER — COLCHICINE 0.6 MG PO TABS: 0.3000 mg | ORAL_TABLET | Freq: Every day | ORAL | 0 refills | Status: AC

## 2022-04-21 MED ORDER — COLCHICINE 0.3 MG HALF TABLET
0.3000 mg | ORAL_TABLET | Freq: Every day | ORAL | Status: DC
  Administered 2022-04-21: 0.3 mg via ORAL
  Filled 2022-04-21: qty 1

## 2022-04-21 MED ORDER — PREDNISONE 10 MG PO TABS: ORAL_TABLET | ORAL | 0 refills | Status: DC

## 2022-04-21 MED ORDER — AMLODIPINE BESYLATE 5 MG PO TABS: 5.0000 mg | ORAL_TABLET | Freq: Every day | ORAL | 1 refills | Status: AC

## 2022-04-21 MED ORDER — PREDNISONE 20 MG PO TABS
20.0000 mg | ORAL_TABLET | Freq: Every day | ORAL | Status: DC
  Administered 2022-04-21: 20 mg via ORAL
  Filled 2022-04-21: qty 1

## 2022-04-21 MED ORDER — COLCHICINE 0.6 MG PO TABS: 0.6000 mg | ORAL_TABLET | Freq: Every day | ORAL | Status: DC

## 2022-04-21 MED ORDER — APIXABAN 5 MG PO TABS: 5.0000 mg | ORAL_TABLET | Freq: Two times a day (BID) | ORAL | 1 refills | Status: AC

## 2022-04-21 MED ORDER — POLYETHYLENE GLYCOL 3350 17 G PO PACK: 17.0000 g | PACK | Freq: Every day | ORAL | 0 refills | Status: AC | PRN

## 2022-04-21 NOTE — Progress Notes (Signed)
Patient ID: Tim Vincent, male   DOB: 1956-02-06, 67 y.o.   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 Patient family bedside stating patient had surgery by plastics in past for similar left elbow gouty tophus. Has been seen in past for both elbows. Did attempt to aspirate / express right elbow today with 18 gauge needle after prep with betadine. Unable to aspirate or express any material other than blood. Spoke with Dr. Sharol Given ,who said that Plastic surgery should be consulted as they performed surgery on the left elbow  and again saw him for the right elbow. Patient tolerated attempted aspiration today without any immediate adverse effects. Elbow wrapped with guaze , ABD pad and ace wrap for comfort.

## 2022-04-21 NOTE — Discharge Summary (Signed)
Physician Discharge Summary  Tim Vincent [id number] DOB: 04-06-1955 DOA: 04/16/2022  PCP: Armanda Heritage, NP  Admit date: 04/16/2022 Discharge date: 04/21/2022  Admitted From: home Discharge disposition: home   Recommendations for Outpatient Follow-Up:   Home health Referral to a fib clinic (converted to sinus- had fib for several days after procedure) Referral to nephrology Referral to plastic surgery   Discharge Diagnosis:   Active Problems:   Necrotizing fasciitis of lower leg ([place])   Essential hypertension   Type 2 diabetes mellitus without complication ([place])   Dyslipidemia    Discharge Condition: Improved.  Diet recommendation: Low sodium, heart healthy.  Carbohydrate-modified  Wound care: None.  Code status: Full.   History of Present Illness:   Tim Vincent is a 67 y.o. male with medical history significant for type 2 diabetes mellitus and hypertension, presented to the emergency room with acute onset of left foot swelling, and duration, warmth with associated erythema and pain since Wednesday.  It was apparently trying to remove a fish eye before this happened.  He admitted to chills but denied any tactile fever.  No nausea or vomiting or diarrhea or abdominal pain.  No chest pain or palpitations.  No cough or wheezing or hemoptysis.  No dysuria, oliguria or hematuria or flank pain.  He denies any headache or dizziness or blurred vision.     Hospital Course by Problem:   Necrotizing fasciitis of lower leg (HCC) - This involves the right lower leg and foot. 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 But this is from the part that was removed so will d/c abx after speaking with ID pharmacy -wound vac removed   A fib with RVR -echo done -converted to sinus 2/10 -BB  -start NOAC -Mali vasc 2: 3+   Essential hypertension - adjust meds for better control -follow up outpatient   -  continue his Tradjenta.   Dyslipidemia - continue statin therapy.   CKD stage IIIB -will need outpatient follow up -baseline 2.8-- follow up with PCP   Hyperkalemia -lokelma x 1   Thrush Nystatin -treated  Gout -prednisone taper -3 days of colchicine per ortho -outpatient plastics follow up  Medical Consultants:   ortho    Discharge Exam:   Vitals:   04/21/22 0955 04/21/22 1251  BP: (!) 157/72 (!) 160/71  Pulse: 69 69  Resp:  18  Temp:  99 F (37.2 C)  SpO2:  93%   Vitals:   04/21/22 0434 04/21/22 0730 04/21/22 0955 04/21/22 1251  BP: (!) 155/74 (!) 157/73 (!) 157/72 (!) 160/71  Pulse: 66 64 69 69  Resp: 19 18  18  $ Temp: 98.8 F (37.1 C) 98.5 F (36.9 C)  99 F (37.2 C)  TempSrc: Oral Oral  Oral  SpO2: 92% 97%  93%  Weight:      Height:        General exam: Appears calm and comfortable.   The results of significant diagnostics from this hospitalization (including imaging, microbiology, ancillary and laboratory) are listed below for reference.     Procedures and Diagnostic Studies:   CT TIBIA FIBULA LEFT WO CONTRAST  Result Date: 04/17/2022 CLINICAL DATA:  Necrotizing fasciitis suspected. Left foot wound draining pus x3 days. EXAM: CT OF THE LEFT TIBIA FIBULA WITHOUT CONTRAST; CT OF THE LEFT FOOT WITHOUT CONTRAST TECHNIQUE: Multidetector CT imaging of the lower left extremity was performed from the distal femoral metaphysis through the foot  according to the standard protocol. Multiplanar reconstructions were created from the axial data sets. RADIATION DOSE REDUCTION: This exam was performed according to the departmental dose-optimization program which includes automated exposure control, adjustment of the mA and/or kV according to patient size and/or use of iterative reconstruction technique. COMPARISON:  Left foot series from yesterday. FINDINGS: Bones/Joint/Cartilage There is mild tricompartmental degenerative arthrosis of the left knee and left ankle.  There is a popliteal cyst measuring 3.6 x 3.8 x 8.3 cm and containing multiple small osteochondral loose bodies. A small suprapatellar low-density bursal effusion is also noted. There is patellar enthesopathy. Mild spurring of the malleolar undersurfaces is also seen. There are degenerative subcortical cystic changes in medial talar dome. The bone mineralization is normal. No fracture is evident. There is moderate hallux valgus. There is dorsal dislocation of the second and third toe proximal phalanges in relation to the metatarsals, with dorsal overriding. Relative hyperextension without dislocation of the fourth toe MTP joint. No fracture is evident, but there is gas in the medullary spaces of the second and third metatarsal heads and destructive changes to the dorsal and ventral aspect of the third metatarsal head consistent with osteomyelitis. The imaged left lower extremity otherwise does not show destructive bone changes. There is a chronic healed fracture deformity of the distal fifth metatarsal shaft. There are enthesopathic changes of the calcaneus. Small loose bodies are noted of the medial tibiotalar joint. Moderate hallux valgus is noted as on the foot series. There is mild narrowing and spurring of the first MTP joint. Ligaments Suboptimally assessed by CT. Calcifications are noted both in the ACL and PCL, as well as in the deltoid ligamentous complex in the ankle. Muscles and Tendons There is soft tissue gas in the subcutaneous plane in the anterolateral foreleg beginning just below the level of the knee joint line and continuing inferiorly, with additional patchy soft tissue gas in anterior compartment musculature in the foreleg. A small amount is noted in the intrinsic plantar foot musculature. Findings are consistent with necrotizing fasciitis. The dorsal foreleg musculature does not contain soft tissue gas. The intrinsic muscle bulk is normal except for partial atrophy of the intrinsic plantar foot  musculature. There are multifocal tendon calcifications at the level of the knee. There are calcifications in the Achilles tendon and fusiform thickening suggesting chronic calcific tendinopathy. Soft tissues There is diffuse edema in the anterior foreleg and throughout the foot. As above, subcutaneous soft tissue gas is noted in the anterior to anterolateral foreleg and continues into the foot, where it extends over the top of the foot and circumferentially around the second and third toe metatarsal heads and necks and around the second and third toe proximal phalanges. This is compatible with gas-forming necrotizing infection as well. A gas-filled tract extends to the plantar skin surface where there is a draining open wound at the plantar aspect underlying the area between the second and third metatarsal heads. The popliteal trifurcation arteries are heavily calcified. Heavy vascular calcifications continue into the foot. IMPRESSION: 1. 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 2. A gas-filled tract extends to the plantar skin surface from between the heads of the second and third metatarsals where there is a draining open wound. 3. Soft tissue gas extends over the top of the foot and circumferentially around the second and third toe metatarsal heads and necks and around the second and third toe proximal phalanges, small amount of the  intrinsic plantar foot musculature. 4. Gas in the medullary spaces of the second and third metatarsal heads and destructive changes to the dorsal and ventral aspect of the third metatarsal head consistent with osteomyelitis. 5. Dorsal dislocation of the second and third toe proximal phalanges in relation to the metatarsals, with dorsal overriding. 6. Heavy vascular calcifications. 7. Degenerative changes of the left knee, ankle and first MTP joint, with hallux valgus. 8. Large  popliteal cyst containing multiple small osteochondral loose bodies. 9. Calcific tendinopathy/ligamentopathy. Electronically Signed   By: Telford Nab M.D.   On: 04/17/2022 03:59   CT FOOT LEFT WO CONTRAST  Result Date: 04/17/2022 CLINICAL DATA:  Necrotizing fasciitis suspected. Left foot wound draining pus x3 days. EXAM: CT OF THE LEFT TIBIA FIBULA WITHOUT CONTRAST; CT OF THE LEFT FOOT WITHOUT CONTRAST TECHNIQUE: Multidetector CT imaging of the lower left extremity was performed from the distal femoral metaphysis through the foot according to the standard protocol. Multiplanar reconstructions were created from the axial data sets. RADIATION DOSE REDUCTION: This exam was performed according to the departmental dose-optimization program which includes automated exposure control, adjustment of the mA and/or kV according to patient size and/or use of iterative reconstruction technique. COMPARISON:  Left foot series from yesterday. FINDINGS: Bones/Joint/Cartilage There is mild tricompartmental degenerative arthrosis of the left knee and left ankle. There is a popliteal cyst measuring 3.6 x 3.8 x 8.3 cm and containing multiple small osteochondral loose bodies. A small suprapatellar low-density bursal effusion is also noted. There is patellar enthesopathy. Mild spurring of the malleolar undersurfaces is also seen. There are degenerative subcortical cystic changes in medial talar dome. The bone mineralization is normal. No fracture is evident. There is moderate hallux valgus. There is dorsal dislocation of the second and third toe proximal phalanges in relation to the metatarsals, with dorsal overriding. Relative hyperextension without dislocation of the fourth toe MTP joint. No fracture is evident, but there is gas in the medullary spaces of the second and third metatarsal heads and destructive changes to the dorsal and ventral aspect of the third metatarsal head consistent with osteomyelitis. The imaged left lower  extremity otherwise does not show destructive bone changes. There is a chronic healed fracture deformity of the distal fifth metatarsal shaft. There are enthesopathic changes of the calcaneus. Small loose bodies are noted of the medial tibiotalar joint. Moderate hallux valgus is noted as on the foot series. There is mild narrowing and spurring of the first MTP joint. Ligaments Suboptimally assessed by CT. Calcifications are noted both in the ACL and PCL, as well as in the deltoid ligamentous complex in the ankle. Muscles and Tendons There is soft tissue gas in the subcutaneous plane in the anterolateral foreleg beginning just below the level of the knee joint line and continuing inferiorly, with additional patchy soft tissue gas in anterior compartment musculature in the foreleg. A small amount is noted in the intrinsic plantar foot musculature. Findings are consistent with necrotizing fasciitis. The dorsal foreleg musculature does not contain soft tissue gas. The intrinsic muscle bulk is normal except for partial atrophy of the intrinsic plantar foot musculature. There are multifocal tendon calcifications at the level of the knee. There are calcifications in the Achilles tendon and fusiform thickening suggesting chronic calcific tendinopathy. Soft tissues There is diffuse edema in the anterior foreleg and throughout the foot. As above, subcutaneous soft tissue gas is noted in the anterior to anterolateral foreleg and continues into the foot, where it extends over the top of the  foot and circumferentially around the second and third toe metatarsal heads and necks and around the second and third toe proximal phalanges. This is compatible with gas-forming necrotizing infection as well. A gas-filled tract extends to the plantar skin surface where there is a draining open wound at the plantar aspect underlying the area between the second and third metatarsal heads. The popliteal trifurcation arteries are heavily  calcified. Heavy vascular calcifications continue into the foot. IMPRESSION: 1. 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 2. A gas-filled tract extends to the plantar skin surface from between the heads of the second and third metatarsals where there is a draining open wound. 3. Soft tissue gas extends over the top of the foot and circumferentially around the second and third toe metatarsal heads and necks and around the second and third toe proximal phalanges, small amount of the intrinsic plantar foot musculature. 4. Gas in the medullary spaces of the second and third metatarsal heads and destructive changes to the dorsal and ventral aspect of the third metatarsal head consistent with osteomyelitis. 5. Dorsal dislocation of the second and third toe proximal phalanges in relation to the metatarsals, with dorsal overriding. 6. Heavy vascular calcifications. 7. Degenerative changes of the left knee, ankle and first MTP joint, with hallux valgus. 8. Large popliteal cyst containing multiple small osteochondral loose bodies. 9. Calcific tendinopathy/ligamentopathy. Electronically Signed   By: Telford Nab M.D.   On: 04/17/2022 03:59   DG Foot Complete Left  Result Date: 04/17/2022 CLINICAL DATA:  [id number], wound cellulitis left foot. EXAM: LEFT FOOT - COMPLETE 3+ VIEW COMPARISON:  None Available. FINDINGS: There is diffuse swelling in the distal foreleg and foot. Heavy vascular calcifications. There is scattered soft tissue gas subcutaneously the anterior distal foreleg and superior aspect of the foot and a greater amount in the superior and inferior forefoot, the latter primarily around the second and third toe metatarsophalangeal region. 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 No destructive bone lesion is seen. There is hallux valgus and mild nonerosive degenerative change of the  first MTP joint, midfoot arthrosis also noted and small plantar and dorsal calcaneal heel spurs. There is a chronic healed fracture deformity of the distal fifth metatarsal shaft. No acute fracture is seen.  The bone mineralization is normal. IMPRESSION: 1. Diffuse swelling in the distal foreleg and foot. 2. Scattered soft tissue gas in the distal foreleg and superior aspect of the foot, greater soft tissue gas in the forefoot consistent with a gas-forming infection such as gas gangrene. 3. No destructive bone lesion is seen. 4. Heavy vascular calcifications. 5. Additional chronic changes. Electronically Signed   By: Telford Nab M.D.   On: 04/17/2022 00:47     Labs:   Basic Metabolic Panel: Recent Labs  Lab 04/18/22 0158 04/18/22 1034 04/19/22 0217 04/20/22 0756 04/21/22 0219  NA 133* 135 135 139 139  K 5.8* 5.1 5.1 4.9 4.9  CL 105 107 108 114* 111  CO2 16* 14* 13* 14* 17*  GLUCOSE 403* 335* 174* 109* 99  BUN 87* 96* 106* 82* 68*  CREATININE 3.47* 3.72* 3.58* 3.30* 2.89*  CALCIUM 7.1* 7.4* 7.5* 8.0* 8.2*  MG 2.1  --   --   --   --    GFR Estimated Creatinine Clearance: 25.6 mL/min (A) (by C-G formula based on SCr of 2.89 mg/dL (H)). Liver Function Tests: Recent Labs  Lab 04/16/22 2333  AST 27  ALT 42  ALKPHOS 112  BILITOT 0.4  PROT 7.2  ALBUMIN 2.5*   No results for input(s): "LIPASE", "AMYLASE" in the last 168 hours. No results for input(s): "AMMONIA" in the last 168 hours. Coagulation profile No results for input(s): "INR", "PROTIME" in the last 168 hours.  CBC: Recent Labs  Lab 04/16/22 2333 04/17/22 0358 04/18/22 0158 04/19/22 0217 04/21/22 0219  WBC 13.8* 11.8* 11.4* 12.4* 10.6*  NEUTROABS 12.8*  --   --   --   --   HGB 11.8* 10.6* 9.9* 10.2* 11.1*  HCT 36.7* 34.1* 30.7* 32.4* 35.8*  MCV 90.4 91.9 92.5 92.6 92.5  PLT 334 293 250 299 304   Cardiac Enzymes: No results for input(s): "CKTOTAL", "CKMB", "CKMBINDEX", "TROPONINI" in the last 168  hours. BNP: Invalid input(s): "POCBNP" CBG: Recent Labs  Lab 04/20/22 1217 04/20/22 1703 04/20/22 2116 04/21/22 0828 04/21/22 1249  GLUCAP 105* 161* 139* 89 160*   D-Dimer No results for input(s): "DDIMER" in the last 72 hours. Hgb A1c No results for input(s): "HGBA1C" in the last 72 hours. Lipid Profile No results for input(s): "CHOL", "HDL", "LDLCALC", "TRIG", "CHOLHDL", "LDLDIRECT" in the last 72 hours. Thyroid function studies No results for input(s): "TSH", "T4TOTAL", "T3FREE", "THYROIDAB" in the last 72 hours.  Invalid input(s): "FREET3" Anemia work up No results for input(s): "VITAMINB12", "FOLATE", "FERRITIN", "TIBC", "IRON", "RETICCTPCT" in the last 72 hours. Microbiology Recent Results (from the past 240 hour(s))  Blood Cultures x 2 sites     Status: None   Collection Time: 04/16/22 11:33 PM   Specimen: BLOOD  Result Value Ref Range Status   Specimen Description BLOOD BLOOD RIGHT ARM  Final   Special Requests   Final    BOTTLES DRAWN AEROBIC AND ANAEROBIC Blood Culture adequate volume   Culture   Final    NO GROWTH 5 DAYS Performed at [facility], [address], [place], [place] [address]    Report Status 04/21/2022 FINAL  Final  Blood Cultures x 2 sites     Status: None   Collection Time: 04/16/22 11:33 PM   Specimen: BLOOD  Result Value Ref Range Status   Specimen Description BLOOD BLOOD RIGHT ARM  Final   Special Requests   Final    BOTTLES DRAWN AEROBIC AND ANAEROBIC Blood Culture adequate volume   Culture   Final    NO GROWTH 5 DAYS Performed at [facility], [place] [address] [place], [place] [address]    Report Status 04/21/2022 FINAL  Final  Surgical pcr screen     Status: None   Collection Time: 04/17/22  1:31 PM   Specimen: Nasal Mucosa; Nasal Swab  Result Value Ref Range Status   MRSA, PCR NEGATIVE NEGATIVE Final   Staphylococcus aureus NEGATIVE NEGATIVE Final    Comment: (NOTE) The Xpert SA Assay (FDA approved for NASAL  specimens in patients 63 years of age and older), is one component of a comprehensive surveillance program. It is not intended to diagnose infection nor to guide or monitor treatment. Performed at [facility], [place] [address], [place], [place] [address]   Aerobic/Anaerobic Culture w Gram Stain (surgical/deep wound)     Status: None (Preliminary result)   Collection Time: 04/17/22  2:17 PM   Specimen: Wound; Tissue  Result Value Ref Range Status   Specimen Description TISSUE LEFT LEG  Final   Special Requests NONE  Final   Gram Stain   Final  NO WBC SEEN FEW GRAM NEGATIVE RODS FEW GRAM POSITIVE COCCI    Culture   Final    FEW ENTEROCOCCUS FAECALIS FEW STREPTOCOCCUS MITIS/ORALIS HOLDING FOR POSSIBLE ANAEROBE Performed at [facility], [place] [address], [place], [place] [address]    Report Status PENDING  Incomplete   Organism ID, Bacteria ENTEROCOCCUS FAECALIS  Final   Organism ID, Bacteria STREPTOCOCCUS MITIS/ORALIS  Final      Susceptibility   Enterococcus faecalis - MIC*    AMPICILLIN <=2 SENSITIVE Sensitive     VANCOMYCIN 1 SENSITIVE Sensitive     GENTAMICIN SYNERGY SENSITIVE Sensitive     * FEW ENTEROCOCCUS FAECALIS   Streptococcus mitis/oralis - MIC*    TETRACYCLINE <=0.25 SENSITIVE Sensitive     VANCOMYCIN 0.5 SENSITIVE Sensitive     CLINDAMYCIN <=0.25 SENSITIVE Sensitive     * FEW STREPTOCOCCUS MITIS/ORALIS     Discharge Instructions:   Discharge Instructions     Ambulatory referral to Cardiology   Complete by: As directed    Afib clinic   Ambulatory referral to Nephrology   Complete by: As directed    CKD   Ambulatory referral to Plastic Surgery   Complete by: As directed    Diet - low sodium heart healthy   Complete by: As directed    Diet Carb Modified   Complete by: As directed    Increase activity slowly   Complete by: As directed    Negative Pressure Wound Therapy - Incisional   Complete by: As directed    No wound care   Complete  by: As directed       Allergies as of 04/21/2022   No Known Allergies      Medication List     STOP taking these medications    cefdinir 300 MG capsule Commonly known as: OMNICEF   lisinopril 10 MG tablet Commonly known as: ZESTRIL   ondansetron 4 MG disintegrating tablet Commonly known as: ZOFRAN-ODT       TAKE these medications    acetaminophen 325 MG tablet Commonly known as: TYLENOL Take 1-2 tablets (325-650 mg total) by mouth every 6 (six) hours as needed for mild pain (pain score 1-3 or temp > 100.5).   amLODipine 5 MG tablet Commonly known as: NORVASC Take 1 tablet (5 mg total) by mouth daily. Start taking on: April 22, 2022   apixaban 5 MG Tabs tablet Commonly known as: ELIQUIS Take 1 tablet (5 mg total) by mouth 2 (two) times daily.   atorvastatin 40 MG tablet Commonly known as: LIPITOR Take 40 mg by mouth daily.   bisacodyl 5 MG EC tablet Commonly known as: DULCOLAX Take 1 tablet (5 mg total) by mouth daily as needed for moderate constipation.   colchicine 0.6 MG tablet Take 0.5 tablets (0.3 mg total) by mouth daily.   metoprolol tartrate 25 MG tablet Commonly known as: LOPRESSOR Take 1 tablet (25 mg total) by mouth 2 (two) times daily.   oxyCODONE 5 MG immediate release tablet Commonly known as: Oxy IR/ROXICODONE Take 1-2 tablets (5-10 mg total) by mouth every 4 (four) hours as needed for moderate pain (pain score 4-6).   polyethylene glycol 17 g packet Commonly known as: MIRALAX / GLYCOLAX Take 17 g by mouth daily as needed for mild constipation.   predniSONE 10 MG tablet Commonly known as: DELTASONE 20 mg daily x 5 days then 10 mg x 5 days then stop Start taking on: April 22, 2022   Tradjenta 5 MG  Tabs tablet Generic drug: linagliptin Take 5 mg by mouth daily.               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  A walker will not resolve issue with performing activities of daily living. A wheelchair will allow patient to safely perform daily activities. Patient can safely propel the wheelchair in the home or has a caregiver who can provide assistance. Length of need 6 months . Accessories: elevating leg rests (ELRs), wheel locks, extensions and anti-tippers.   04/18/22 1032   04/18/22 1030  For home use only DME Walker rolling  Once       Question Answer Comment  Walker: With 5 Inch Wheels   Patient needs a walker to treat with the following condition Generalized weakness      04/18/22 1029            Follow-up Information     Newt Minion, MD Follow up in 1 week(s).   Specialty: Orthopedic Surgery Contact information: [place] [address] [facility] Follow up.   Why: Rolling Walker, Wheelchair, Tube bench-office to deliver to the room prior to d/c. home. Contact information: [place] [address] [phone]         Care, [facility] Follow up.   Specialty: Home Health Services Why: Registered Nurse and Physical Therapy-office to call with visit times. Contact information: [address] [place] [address] [phone]                  Time coordinating discharge: 45 min  Signed:  Geradine Girt DO  [facility] 04/21/2022, 1:19 PM

## 2022-04-21 NOTE — TOC Transition Note (Signed)
Transition of Care [facility]) - CM/SW Discharge Note   Patient Details  Name: Tim Vincent MRN: [id number] Date of Birth: [birth year]  Transition of Care [facility]) CM/SW Contact:  Maebelle Munroe, RN Phone Number: 04/21/2022, 3:00 PM   Clinical Narrative:  [facility] team spoke to patient and family for discharge planning. Son translated for patient. Patient is alert, pleasant and verbally responsive. 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 Discussed discharge needs. Patient already has wheelchair and tub bench in the room. Needs rolling walker with 5" wheels. Ordered walker from [employer] to be delivered to room prior to discharge. Home health services arranged and confirmed. [facility] to furnish RN, PT and OT. Family requests a commode chair. Upon discussing it with [employer] representative, she notes that new Medicare guidelines state if patient has walker and/or wheelchair ability the commode will not be covered. Patient may have an out of pocket cost. Relay this information to [place]- N[place]. She will discuss with patient and family. Will continue to monitor for any further discharge needs.     Final next level of care: Home w Home Health Services Barriers to Discharge: No Barriers Identified   Patient Goals and CMS Choice CMS Medicare.gov Compare Post Acute Care list provided to:: Patient Choice offered to / list presented to : Patient, Adult Children  Discharge Placement                         Discharge Plan and Services Additional resources added to the After Visit Summary for   In-house Referral: NA Discharge Planning Services: CM Consult Post Acute Care Choice: Home Health          DME Arranged: Gilford Rile wide (Rolling walker with 5" wheels.) DME Agency: [employer] Date DME Agency Contacted: 04/21/22 Time DME Agency Contacted: [phone] Representative spoke with at [place]: Delana [phone] HH Arranged: RN, PT, OT [facility] Agency: [place] Date Section:  04/21/22 Time [place]: [id number] Representative spoke with at [place]: Georgina [phone] (Confirm service nneds and notified of discharge today.)  Social Determinants of Health (SDOH) Interventions SDOH Screenings   Food Insecurity: No Food Insecurity (04/17/2022)  Housing: Low Risk  (04/17/2022)  Transportation Needs: No Transportation Needs (04/17/2022)  Utilities: Not At Risk (04/17/2022)  Tobacco Use: Low Risk  (04/18/2022)     Readmission Risk Interventions     No data to display

## 2022-04-24 ENCOUNTER — Encounter: Payer: Self-pay | Admitting: Family

## 2022-04-24 ENCOUNTER — Ambulatory Visit (INDEPENDENT_AMBULATORY_CARE_PROVIDER_SITE_OTHER): Payer: 59 | Admitting: Family

## 2022-04-24 DIAGNOSIS — Z89222 Acquired absence of left upper limb above elbow: Secondary | ICD-10-CM

## 2022-04-24 DIAGNOSIS — Z191 Hormone sensitive malignancy status: Secondary | ICD-10-CM | POA: Diagnosis not present

## 2022-04-24 NOTE — Progress Notes (Signed)
   Post-Op Visit Note   Patient: Tim Vincent           Date of Birth: 1955/03/15           MRN: [id number] Visit Date: 04/24/2022 PCP: Armanda Heritage, NP  Chief Complaint:  Chief Complaint  Patient presents with   Left Leg - Routine Post Op    04/17/22 left AKA    HPI:  HPI The patient is a 67 year old gentleman seen status post left above-knee amputation on February 7. Ortho Exam On examination left residual limb the incision is well-approximated sutures there is no gaping drainage or sign of infection  Visit Diagnoses: No diagnosis found.  Plan: Begin daily Dial soap cleansing.  Dry dressings.  Follow-up in 2 weeks for suture removal.  Begin wearing shrinker once obtained.   Follow-Up Instructions: No follow-ups on file.   Imaging: No results found.  Orders:  No orders of the defined types were placed in this encounter.  No orders of the defined types were placed in this encounter.    PMFS History: Patient Active Problem List   Diagnosis Date Noted   Diabetic infection of left foot ([place]) 04/17/2022   Necrotizing fasciitis of lower leg ([place]) 04/17/2022   Dyslipidemia 04/17/2022   Essential hypertension 01/12/2021   Obesity 01/12/2021   Skin sensation disturbance 01/12/2021   Type 2 diabetes mellitus without complication ([place]) 17/61/6073   Diabetic neuropathy ([place]) 01/12/2021   Hav (hallux abducto valgus), unspecified laterality 01/12/2021   Cough 10/31/2017   Elevated blood pressure reading 10/31/2017   Past Medical History:  Diagnosis Date   Diabetes mellitus without complication ([place])    Hypertension     No family history on file.  Past Surgical History:  Procedure Laterality Date   AMPUTATION Left 04/17/2022   Procedure: LEFT ABOVE KNEE AMPUTATION;  Surgeon: Newt Minion, MD;  Location: [place];  Service: Orthopedics;  Laterality: Left;   MASS EXCISION Left 04/17/2021   Procedure: EXCISION LEFT ELBOW MASS;  Surgeon: Cindra Presume, MD;  Location:  [place];  Service: Plastics;  Laterality: Left;  1 hour   MASS EXCISION Left 08/10/2021   Procedure: EXCISION ELBOW MASS;  Surgeon: Cindra Presume, MD;  Location: [place];  Service: Plastics;  Laterality: Left;  1 hour   Social History   Occupational History   Not on file  Tobacco Use   Smoking status: Never   Smokeless tobacco: Never  Vaping Use   Vaping Use: Never used  Substance and Sexual Activity   Alcohol use: Yes    Alcohol/week: 6.0 standard drinks of alcohol    Types: 6 Cans of beer per week   Drug use: Never   Sexual activity: Not on file

## 2022-04-25 ENCOUNTER — Ambulatory Visit: Payer: 59 | Admitting: Internal Medicine

## 2022-04-25 ENCOUNTER — Telehealth: Payer: Self-pay | Admitting: Orthopedic Surgery

## 2022-04-25 DIAGNOSIS — E785 Hyperlipidemia, unspecified: Secondary | ICD-10-CM | POA: Diagnosis not present

## 2022-04-25 DIAGNOSIS — I10 Essential (primary) hypertension: Secondary | ICD-10-CM | POA: Diagnosis not present

## 2022-04-25 DIAGNOSIS — Z89612 Acquired absence of left leg above knee: Secondary | ICD-10-CM | POA: Diagnosis not present

## 2022-04-25 DIAGNOSIS — E119 Type 2 diabetes mellitus without complications: Secondary | ICD-10-CM | POA: Diagnosis not present

## 2022-04-25 DIAGNOSIS — Z4781 Encounter for orthopedic aftercare following surgical amputation: Secondary | ICD-10-CM | POA: Diagnosis not present

## 2022-04-25 NOTE — Telephone Encounter (Signed)
Christine called. She is the PT working with patient in home. She would like verbal orders for home health nurse to see the patient. Her call back number is [phone]

## 2022-04-26 NOTE — Telephone Encounter (Signed)
SW Altha Harm, gave her VO approval to have nurse come out and asses pt. I did mention to her that he was here earlier this week and incision looked good.

## 2022-04-26 NOTE — Telephone Encounter (Signed)
Lmtcb, did not leave details on VM, was not identified.

## 2022-04-29 ENCOUNTER — Telehealth: Payer: Self-pay | Admitting: Family

## 2022-04-29 NOTE — Telephone Encounter (Signed)
Patient's daughter Herb Grays called advised patient left stump is draining. [name] said the fluid is equal to 2 1/2 tablespoon on the gauze. Herb Grays said it has now stopped draining. Patient is scheduled to be seen back in the office 05/08/2022.  The number to contact Herb Grays is [phone]

## 2022-04-30 ENCOUNTER — Other Ambulatory Visit: Payer: Self-pay | Admitting: Orthopedic Surgery

## 2022-04-30 ENCOUNTER — Telehealth: Payer: Self-pay | Admitting: Orthopedic Surgery

## 2022-04-30 DIAGNOSIS — Z89612 Acquired absence of left leg above knee: Secondary | ICD-10-CM | POA: Diagnosis not present

## 2022-04-30 DIAGNOSIS — E785 Hyperlipidemia, unspecified: Secondary | ICD-10-CM | POA: Diagnosis not present

## 2022-04-30 DIAGNOSIS — E1159 Type 2 diabetes mellitus with other circulatory complications: Secondary | ICD-10-CM | POA: Diagnosis not present

## 2022-04-30 DIAGNOSIS — Z4781 Encounter for orthopedic aftercare following surgical amputation: Secondary | ICD-10-CM | POA: Diagnosis not present

## 2022-04-30 DIAGNOSIS — E119 Type 2 diabetes mellitus without complications: Secondary | ICD-10-CM | POA: Diagnosis not present

## 2022-04-30 DIAGNOSIS — I10 Essential (primary) hypertension: Secondary | ICD-10-CM | POA: Diagnosis not present

## 2022-04-30 DIAGNOSIS — I152 Hypertension secondary to endocrine disorders: Secondary | ICD-10-CM | POA: Diagnosis not present

## 2022-04-30 MED ORDER — OXYCODONE HCL 5 MG PO TABS: 5.0000 mg | ORAL_TABLET | ORAL | 0 refills | Status: AC | PRN

## 2022-04-30 NOTE — Telephone Encounter (Signed)
April [facility]  home health [id number]  need orders for skilled nursing 1 week 3

## 2022-04-30 NOTE — Telephone Encounter (Signed)
SW dtr, she says that incision is not draining any more. Everything looks good. HHRN came out today and gave a good report to them. I advised to keep compression on it and keep leg elevated.   She did request that pt needs an oxycodone refill, last filled 04/21/22 s/p AKA

## 2022-04-30 NOTE — Telephone Encounter (Signed)
Gave VO approval on April's identified VM

## 2022-05-01 DIAGNOSIS — Z4781 Encounter for orthopedic aftercare following surgical amputation: Secondary | ICD-10-CM | POA: Diagnosis not present

## 2022-05-01 DIAGNOSIS — E785 Hyperlipidemia, unspecified: Secondary | ICD-10-CM | POA: Diagnosis not present

## 2022-05-01 DIAGNOSIS — Z89612 Acquired absence of left leg above knee: Secondary | ICD-10-CM | POA: Diagnosis not present

## 2022-05-01 DIAGNOSIS — E119 Type 2 diabetes mellitus without complications: Secondary | ICD-10-CM | POA: Diagnosis not present

## 2022-05-01 DIAGNOSIS — I10 Essential (primary) hypertension: Secondary | ICD-10-CM | POA: Diagnosis not present

## 2022-05-02 DIAGNOSIS — N281 Cyst of kidney, acquired: Secondary | ICD-10-CM | POA: Diagnosis not present

## 2022-05-02 DIAGNOSIS — N1832 Chronic kidney disease, stage 3b: Secondary | ICD-10-CM | POA: Diagnosis not present

## 2022-05-03 ENCOUNTER — Telehealth: Payer: Self-pay | Admitting: Orthopedic Surgery

## 2022-05-03 NOTE — Telephone Encounter (Signed)
Family advising that patient is experiencing pain in surgery leg. They are wanting to talk to someone ASAP.Marland Kitchen

## 2022-05-03 NOTE — Telephone Encounter (Signed)
I called [phone] pt's family states that the pt is having increased pain and that he has tiny blister at the incision. The leg is not warm, it is not red, he does not have drainage and no fever. The pt has not been wearing his shrinker and per family has been up doing a lot the past several days. Advised to rest, elevate wear his shrinker and can call on Monday with update. He has an appt on Wednesday but we can move this up sooner if we need to. Voiced understanding and pleased with plan.

## 2022-05-04 DIAGNOSIS — S81801A Unspecified open wound, right lower leg, initial encounter: Secondary | ICD-10-CM | POA: Diagnosis not present

## 2022-05-06 DIAGNOSIS — E875 Hyperkalemia: Secondary | ICD-10-CM | POA: Diagnosis not present

## 2022-05-08 ENCOUNTER — Encounter: Payer: Self-pay | Admitting: Plastic Surgery

## 2022-05-08 ENCOUNTER — Encounter: Payer: Self-pay | Admitting: Family

## 2022-05-08 ENCOUNTER — Ambulatory Visit (INDEPENDENT_AMBULATORY_CARE_PROVIDER_SITE_OTHER): Payer: 59 | Admitting: Family

## 2022-05-08 ENCOUNTER — Ambulatory Visit (INDEPENDENT_AMBULATORY_CARE_PROVIDER_SITE_OTHER): Payer: 59 | Admitting: Plastic Surgery

## 2022-05-08 VITALS — BP 163/74 | HR 65

## 2022-05-08 DIAGNOSIS — M1A9XX1 Chronic gout, unspecified, with tophus (tophi): Secondary | ICD-10-CM

## 2022-05-08 DIAGNOSIS — Z89612 Acquired absence of left leg above knee: Secondary | ICD-10-CM

## 2022-05-08 DIAGNOSIS — Z4781 Encounter for orthopedic aftercare following surgical amputation: Secondary | ICD-10-CM | POA: Diagnosis not present

## 2022-05-08 DIAGNOSIS — Z191 Hormone sensitive malignancy status: Secondary | ICD-10-CM | POA: Diagnosis not present

## 2022-05-08 DIAGNOSIS — I10 Essential (primary) hypertension: Secondary | ICD-10-CM | POA: Diagnosis not present

## 2022-05-08 DIAGNOSIS — Z89222 Acquired absence of left upper limb above elbow: Secondary | ICD-10-CM

## 2022-05-08 DIAGNOSIS — E119 Type 2 diabetes mellitus without complications: Secondary | ICD-10-CM | POA: Diagnosis not present

## 2022-05-08 DIAGNOSIS — E785 Hyperlipidemia, unspecified: Secondary | ICD-10-CM | POA: Diagnosis not present

## 2022-05-08 NOTE — Progress Notes (Signed)
Referring Provider Trey Sailors, PA [address] [place],  [place] [address]   CC:  Chief Complaint  Patient presents with   Consult      Tim Vincent is an 67 y.o. male.  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  The patient had a similar tophus on his left elbow which he underwent excision.  He did well from this and has requested excision of the tophus on the right.  He states that the tophus has been present for at least 20 years now and that standard treatments for gout have been unsuccessful in helping resolve the tophus.  Of note the patient has been started on Eliquis for atrial fibrillation and has had a left above-knee amputation since resection of the left elbow tophus.  No Known Allergies  Outpatient Encounter Medications as of 05/08/2022  Medication Sig   acetaminophen (TYLENOL) 325 MG tablet Take 1-2 tablets (325-650 mg total) by mouth every 6 (six) hours as needed for mild pain (pain score 1-3 or temp > 100.5).   amLODipine (NORVASC) 5 MG tablet Take 1 tablet (5 mg total) by mouth daily.   apixaban (ELIQUIS) 5 MG TABS tablet Take 1 tablet (5 mg total) by mouth 2 (two) times daily.   atorvastatin (LIPITOR) 40 MG tablet Take 40 mg by mouth daily.   bisacodyl (DULCOLAX) 5 MG EC tablet Take 1 tablet (5 mg total) by mouth daily as needed for moderate constipation.   colchicine 0.6 MG tablet Take 0.5 tablets (0.3 mg total) by mouth daily.   metoprolol tartrate (LOPRESSOR) 25 MG tablet Take 1 tablet (25 mg total) by mouth 2 (two) times daily.   oxyCODONE (OXY IR/ROXICODONE) 5 MG immediate release tablet Take 1-2 tablets (5-10 mg total) by mouth every 4 (four) hours as needed for moderate pain (pain score 4-6).   polyethylene glycol (MIRALAX / GLYCOLAX) 17 g packet Take 17 g by mouth daily as needed for mild constipation.   predniSONE (DELTASONE) 10 MG tablet 20 mg daily x 5 days then 10 mg x 5 days then stop   TRADJENTA 5 MG TABS  tablet Take 5 mg by mouth daily.   No facility-administered encounter medications on file as of 05/08/2022.     Past Medical History:  Diagnosis Date   Diabetes mellitus without complication ([place])    Hypertension     Past Surgical History:  Procedure Laterality Date   AMPUTATION Left 04/17/2022   Procedure: LEFT ABOVE KNEE AMPUTATION;  Surgeon: Newt Minion, MD;  Location: [place];  Service: Orthopedics;  Laterality: Left;   MASS EXCISION Left 04/17/2021   Procedure: EXCISION LEFT ELBOW MASS;  Surgeon: Cindra Presume, MD;  Location: [place];  Service: Plastics;  Laterality: Left;  1 hour   MASS EXCISION Left 08/10/2021   Procedure: EXCISION ELBOW MASS;  Surgeon: Cindra Presume, MD;  Location: [place];  Service: Plastics;  Laterality: Left;  1 hour    No family history on file.  Social History   Social History Narrative   Not on file     Review of Systems General: Denies fevers, chills, weight loss CV: Denies chest pain, shortness of breath, palpitations Skin: 2 large masses on the right elbow consistent with gouty tophus.  The first 1 measures approximately 4 x 4 cm and the second measures approximately 2 x 2 cm.  They are painful when he bumps his elbow into them.  Physical Exam    05/08/2022    9:29 AM 04/21/2022   12:51 PM 04/21/2022    9:55 AM  Vitals with BMI  Systolic [id number]  Diastolic 74 71 72  Pulse 65 69 69    General:  No acute distress,  Alert and oriented, Non-Toxic, Normal speech and affect Integument: As noted above 2 areas of gouty tophus on the right elbow.  There is evidence of skin breakdown over the larger of the 2 masses.  Assessment/Plan Gouty tophus, right elbow: We will plan excision of the tophus on the right elbow.  The patient understands that he is now at higher risk due to his new comorbidities and the use of Eliquis.  The risks of bleeding infection and wound complications were discussed.  The patient's  daughter states that he is scheduled to see his cardiologist tomorrow.  They will discuss with him at that time whether the Eliquis can be stopped for 2 to 3 days prior to surgery and resumed greater than 24 hours after surgery.  If it cannot be stopped, the patient is at a much higher risk for bleeding and wound complications postoperatively.  Will schedule at his request.  [name] 05/08/2022, 10:11 AM

## 2022-05-08 NOTE — Progress Notes (Addendum)
Post-Op Visit Note   Patient: Tim Vincent           Date of Birth: [birth year]           MRN: [id number] Visit Date: 05/08/2022 PCP: Filomena Jungling, NP  Chief Complaint:  Chief Complaint  Patient presents with   Left Leg - Routine Post Op    04/17/2022 left AKA    HPI:  HPI The patient is a 67 year old gentleman seen status post left above-knee amputation  Patient is a new left transfemoral amputee.  Patient's current comorbidities are not expected to impact the ability to function with the prescribed prosthesis. Patient verbally communicates a strong desire to use a prosthesis. Patient currently requires mobility aids to ambulate without a prosthesis.  Expects not to use mobility aids with a new prosthesis.  Patient is a K3 level ambulator that spends a lot of time walking around on uneven terrain over obstacles, up and down stairs, and ambulates with a variable cadence.  The patient will benefit from an MPK knee because they frequently encounter uneven terrain, stairs, and sometimes have to walk backwards. The "stumble recovery" and "intuitive stance" features will reduce fall risk and allow the patient to walk down stairs "step over step."  Ortho Exam On examination of the left residual limb this is well consolidated well-healed sutures in place these harvested today without any incident there is no drainage or erythema  Visit Diagnoses:  1. Left above-elbow amputee     Plan: May proceed with prosthesis set up.  Continue shrinker.  Follow-Up Instructions: Return in about 4 weeks (around 06/05/2022), or if symptoms worsen or fail to improve.   Imaging: No results found.  Orders:  No orders of the defined types were placed in this encounter.  No orders of the defined types were placed in this encounter.    PMFS History: Patient Active Problem List   Diagnosis Date Noted   Diabetic infection of left foot (H[facility]) 04/17/2022   Necrotizing fasciitis of lower leg  (H[facility]) 04/17/2022   Dyslipidemia 04/17/2022   Essential hypertension 01/12/2021   Obesity 01/12/2021   Skin sensation disturbance 01/12/2021   Type 2 diabetes mellitus without complication (HCC) 01/12/2021   Diabetic neuropathy (H[facility]) 01/12/2021   Hav (hallux abducto valgus), unspecified laterality 01/12/2021   Cough 10/31/2017   Elevated blood pressure reading 10/31/2017   Past Medical History:  Diagnosis Date   Diabetes mellitus without complication (HCC)    Hypertension     History reviewed. No pertinent family history.  Past Surgical History:  Procedure Laterality Date   AMPUTATION Left 04/17/2022   Procedure: LEFT ABOVE KNEE AMPUTATION;  Surgeon: Nadara Mustard, MD;  [facility];  Service: Orthopedics;  Laterality: Left;   MASS EXCISION Left 04/17/2021   Procedure: EXCISION LEFT ELBOW MASS;  Surgeon: Allena Napoleon, MD;  Location: [facility];  Service: Plastics;  Laterality: Left;  1 hour   MASS EXCISION Left 08/10/2021   Procedure: EXCISION ELBOW MASS;  Surgeon: Allena Napoleon, MD;  Location: [facility];  Service: Plastics;  Laterality: Left;  1 hour   Social History   Occupational History   Not on file  Tobacco Use   Smoking status: Never   Smokeless tobacco: Never  Vaping Use   Vaping Use: Never used  Substance and Sexual Activity   Alcohol use: Yes    Alcohol/week: 6.0 standard drinks of alcohol    Types: 6 Cans of beer per week  Drug use: Never   Sexual activity: Not on file

## 2022-05-09 DIAGNOSIS — E1122 Type 2 diabetes mellitus with diabetic chronic kidney disease: Secondary | ICD-10-CM | POA: Diagnosis not present

## 2022-05-09 DIAGNOSIS — Z89612 Acquired absence of left leg above knee: Secondary | ICD-10-CM | POA: Diagnosis not present

## 2022-05-09 DIAGNOSIS — R0600 Dyspnea, unspecified: Secondary | ICD-10-CM | POA: Diagnosis not present

## 2022-05-09 DIAGNOSIS — E785 Hyperlipidemia, unspecified: Secondary | ICD-10-CM | POA: Diagnosis not present

## 2022-05-09 DIAGNOSIS — I10 Essential (primary) hypertension: Secondary | ICD-10-CM | POA: Diagnosis not present

## 2022-05-09 DIAGNOSIS — I4891 Unspecified atrial fibrillation: Secondary | ICD-10-CM | POA: Diagnosis not present

## 2022-05-09 DIAGNOSIS — I152 Hypertension secondary to endocrine disorders: Secondary | ICD-10-CM | POA: Diagnosis not present

## 2022-05-09 DIAGNOSIS — Z89619 Acquired absence of unspecified leg above knee: Secondary | ICD-10-CM | POA: Diagnosis not present

## 2022-05-09 DIAGNOSIS — E119 Type 2 diabetes mellitus without complications: Secondary | ICD-10-CM | POA: Diagnosis not present

## 2022-05-09 DIAGNOSIS — Z4781 Encounter for orthopedic aftercare following surgical amputation: Secondary | ICD-10-CM | POA: Diagnosis not present

## 2022-05-09 DIAGNOSIS — N184 Chronic kidney disease, stage 4 (severe): Secondary | ICD-10-CM | POA: Diagnosis not present

## 2022-05-09 DIAGNOSIS — E1159 Type 2 diabetes mellitus with other circulatory complications: Secondary | ICD-10-CM | POA: Diagnosis not present

## 2022-05-15 DIAGNOSIS — I10 Essential (primary) hypertension: Secondary | ICD-10-CM | POA: Diagnosis not present

## 2022-05-15 DIAGNOSIS — E785 Hyperlipidemia, unspecified: Secondary | ICD-10-CM | POA: Diagnosis not present

## 2022-05-15 DIAGNOSIS — Z89612 Acquired absence of left leg above knee: Secondary | ICD-10-CM | POA: Diagnosis not present

## 2022-05-15 DIAGNOSIS — E119 Type 2 diabetes mellitus without complications: Secondary | ICD-10-CM | POA: Diagnosis not present

## 2022-05-15 DIAGNOSIS — Z4781 Encounter for orthopedic aftercare following surgical amputation: Secondary | ICD-10-CM | POA: Diagnosis not present

## 2022-05-16 DIAGNOSIS — E119 Type 2 diabetes mellitus without complications: Secondary | ICD-10-CM | POA: Diagnosis not present

## 2022-05-16 DIAGNOSIS — Z5111 Encounter for antineoplastic chemotherapy: Secondary | ICD-10-CM | POA: Diagnosis not present

## 2022-05-16 DIAGNOSIS — I10 Essential (primary) hypertension: Secondary | ICD-10-CM | POA: Diagnosis not present

## 2022-05-16 DIAGNOSIS — Z4781 Encounter for orthopedic aftercare following surgical amputation: Secondary | ICD-10-CM | POA: Diagnosis not present

## 2022-05-16 DIAGNOSIS — E785 Hyperlipidemia, unspecified: Secondary | ICD-10-CM | POA: Diagnosis not present

## 2022-05-16 DIAGNOSIS — Z89612 Acquired absence of left leg above knee: Secondary | ICD-10-CM | POA: Diagnosis not present

## 2022-05-20 ENCOUNTER — Ambulatory Visit: Payer: 59 | Attending: Internal Medicine | Admitting: Internal Medicine

## 2022-05-20 NOTE — Progress Notes (Deleted)
Cardiology Office Note:    Date:  05/20/2022   ID:  Tim, Vincent 08/14/1955, MRN [id number]  PCP:  Armanda Heritage, NP  Cardiologist:  None  Electrophysiologist:  None   Referring MD: Armanda Heritage, NP   Chief Complaint/Reason for Referral: ***  History of Present Illness:    Tim Vincent is a 67 y.o. male with a history of ***  Past Medical History:  Diagnosis Date   Diabetes mellitus without complication ([place])    Hypertension     Past Surgical History:  Procedure Laterality Date   AMPUTATION Left 04/17/2022   Procedure: LEFT ABOVE KNEE AMPUTATION;  Surgeon: Newt Minion, MD;  Location: [place];  Service: Orthopedics;  Laterality: Left;   MASS EXCISION Left 04/17/2021   Procedure: EXCISION LEFT ELBOW MASS;  Surgeon: Cindra Presume, MD;  Location: [place];  Service: Plastics;  Laterality: Left;  1 hour   MASS EXCISION Left 08/10/2021   Procedure: EXCISION ELBOW MASS;  Surgeon: Cindra Presume, MD;  Location: [place];  Service: Plastics;  Laterality: Left;  1 hour    Current Medications: No outpatient medications have been marked as taking for the 05/20/22 encounter (Appointment) with Elouise Munroe, MD.     Allergies:   Patient has no known allergies.   Social History   Tobacco Use   Smoking status: Never   Smokeless tobacco: Never  Vaping Use   Vaping Use: Never used  Substance Use Topics   Alcohol use: Yes    Alcohol/week: 6.0 standard drinks of alcohol    Types: 6 Cans of beer per week   Drug use: Never     Family History: The patient's family history is not on file.  ROS:   Please see the history of present illness.    All other systems reviewed and are negative.  EKGs/Labs/Other Studies Reviewed:    The following studies were reviewed today:  EKG:  ***  Imaging studies that I have independently reviewed today: ***  Recent Labs: 04/16/2022: ALT 42 04/18/2022: Magnesium 2.1; TSH 0.640 04/21/2022: BUN 68;  Creatinine, Ser 2.89; Hemoglobin 11.1; Platelets 304; Potassium 4.9; Sodium 139  Recent Lipid Panel No results found for: "CHOL", "TRIG", "HDL", "CHOLHDL", "VLDL", "LDLCALC", "LDLDIRECT"  Physical Exam:    VS:  There were no vitals taken for this visit.    Wt Readings from Last 5 Encounters:  04/17/22 182 lb 15.7 oz (83 kg)  08/10/21 189 lb 6 oz (85.9 kg)  08/02/21 192 lb 3.2 oz (87.2 kg)  04/17/21 191 lb 2.2 oz (86.7 kg)  03/22/21 185 lb 9.6 oz (84.2 kg)    Constitutional: No acute distress Eyes: sclera non-icteric, normal conjunctiva and lids ENMT: normal dentition, moist mucous membranes Cardiovascular: regular rhythm, normal rate, no murmur. S1 and S2 normal. No jugular venous distention.  Respiratory: clear to auscultation bilaterally GI : normal bowel sounds, soft and nontender. No distention.   MSK: extremities warm, well perfused. No edema.  NEURO: grossly nonfocal exam, moves all extremities. PSYCH: alert and oriented x 3, normal mood and affect.   ASSESSMENT:    No diagnosis found. PLAN:    No diagnosis found.  Total time of encounter: *** minutes total time of encounter, including *** minutes spent in face-to-face patient care on the date of this encounter. This time includes coordination of care and counseling regarding above mentioned problem list. Remainder of non-face-to-face time involved reviewing chart documents/testing relevant to the patient encounter and  documentation in the medical record. I have independently reviewed documentation from referring provider.   Cherlynn Kaiser, MD, [place]   Shared Decision Making/Informed Consent:   {Are you ordering a CV Procedure (e.g. stress test, cath, DCCV, TEE, etc)?   Press F2        :[id number]   Medication Adjustments/Labs and Tests Ordered: Current medicines are reviewed at length with the patient today.  Concerns regarding medicines are outlined above.   No orders of the defined  types were placed in this encounter.   No orders of the defined types were placed in this encounter.   There are no Patient Instructions on file for this visit.

## 2022-05-22 DIAGNOSIS — E119 Type 2 diabetes mellitus without complications: Secondary | ICD-10-CM | POA: Diagnosis not present

## 2022-05-22 DIAGNOSIS — Z89612 Acquired absence of left leg above knee: Secondary | ICD-10-CM | POA: Diagnosis not present

## 2022-05-22 DIAGNOSIS — I10 Essential (primary) hypertension: Secondary | ICD-10-CM | POA: Diagnosis not present

## 2022-05-22 DIAGNOSIS — E785 Hyperlipidemia, unspecified: Secondary | ICD-10-CM | POA: Diagnosis not present

## 2022-05-22 DIAGNOSIS — Z4781 Encounter for orthopedic aftercare following surgical amputation: Secondary | ICD-10-CM | POA: Diagnosis not present

## 2022-05-31 ENCOUNTER — Telehealth: Payer: Self-pay | Admitting: Plastic Surgery

## 2022-05-31 NOTE — Telephone Encounter (Signed)
No Auth Required for Dx: M1A.9XX1 excision of mass on right elbow.

## 2022-06-05 DIAGNOSIS — M1000 Idiopathic gout, unspecified site: Secondary | ICD-10-CM | POA: Diagnosis not present

## 2022-06-05 DIAGNOSIS — E1159 Type 2 diabetes mellitus with other circulatory complications: Secondary | ICD-10-CM | POA: Diagnosis not present

## 2022-06-05 DIAGNOSIS — I152 Hypertension secondary to endocrine disorders: Secondary | ICD-10-CM | POA: Diagnosis not present

## 2022-06-05 DIAGNOSIS — E119 Type 2 diabetes mellitus without complications: Secondary | ICD-10-CM | POA: Diagnosis not present

## 2022-06-05 DIAGNOSIS — E118 Type 2 diabetes mellitus with unspecified complications: Secondary | ICD-10-CM | POA: Diagnosis not present

## 2022-06-10 DIAGNOSIS — L089 Local infection of the skin and subcutaneous tissue, unspecified: Secondary | ICD-10-CM | POA: Diagnosis not present

## 2022-06-10 DIAGNOSIS — E11628 Type 2 diabetes mellitus with other skin complications: Secondary | ICD-10-CM | POA: Diagnosis not present

## 2022-06-10 DIAGNOSIS — M726 Necrotizing fasciitis: Secondary | ICD-10-CM | POA: Diagnosis not present

## 2022-06-11 DIAGNOSIS — R809 Proteinuria, unspecified: Secondary | ICD-10-CM | POA: Diagnosis not present

## 2022-06-11 DIAGNOSIS — N19 Unspecified kidney failure: Secondary | ICD-10-CM | POA: Diagnosis not present

## 2022-06-11 DIAGNOSIS — Z794 Long term (current) use of insulin: Secondary | ICD-10-CM | POA: Diagnosis not present

## 2022-06-11 DIAGNOSIS — D649 Anemia, unspecified: Secondary | ICD-10-CM | POA: Diagnosis not present

## 2022-06-11 DIAGNOSIS — E119 Type 2 diabetes mellitus without complications: Secondary | ICD-10-CM | POA: Diagnosis not present

## 2022-06-11 DIAGNOSIS — N184 Chronic kidney disease, stage 4 (severe): Secondary | ICD-10-CM | POA: Diagnosis not present

## 2022-06-11 DIAGNOSIS — S78119A Complete traumatic amputation at level between unspecified hip and knee, initial encounter: Secondary | ICD-10-CM | POA: Diagnosis not present

## 2022-06-11 DIAGNOSIS — I10 Essential (primary) hypertension: Secondary | ICD-10-CM | POA: Diagnosis not present

## 2022-06-13 DIAGNOSIS — Z5112 Encounter for antineoplastic immunotherapy: Secondary | ICD-10-CM | POA: Diagnosis not present

## 2022-06-13 DIAGNOSIS — I10 Essential (primary) hypertension: Secondary | ICD-10-CM | POA: Diagnosis not present

## 2022-06-13 DIAGNOSIS — Z79899 Other long term (current) drug therapy: Secondary | ICD-10-CM | POA: Diagnosis not present

## 2022-06-13 DIAGNOSIS — E119 Type 2 diabetes mellitus without complications: Secondary | ICD-10-CM | POA: Diagnosis not present

## 2022-06-13 DIAGNOSIS — D649 Anemia, unspecified: Secondary | ICD-10-CM | POA: Diagnosis not present

## 2022-06-14 ENCOUNTER — Telehealth: Payer: Self-pay | Admitting: Orthopedic Surgery

## 2022-06-14 ENCOUNTER — Other Ambulatory Visit: Payer: Self-pay | Admitting: Orthopedic Surgery

## 2022-06-14 DIAGNOSIS — Z89612 Acquired absence of left leg above knee: Secondary | ICD-10-CM

## 2022-06-14 NOTE — Telephone Encounter (Signed)
SW dtr she is aware, pt can follow up after he gets prosthetic

## 2022-06-14 NOTE — Telephone Encounter (Signed)
Patients daughter dropped off information and I put it on [employer]. Patients daughter asking if patient need to be seen he gets his prosthetic next week?

## 2022-06-14 NOTE — Telephone Encounter (Signed)
Referral placed for [facility] for PT on prosthetic gait training.

## 2022-06-17 DIAGNOSIS — Z191 Hormone sensitive malignancy status: Secondary | ICD-10-CM | POA: Diagnosis not present

## 2022-06-20 DIAGNOSIS — Z01818 Encounter for other preprocedural examination: Secondary | ICD-10-CM | POA: Diagnosis not present

## 2022-06-20 DIAGNOSIS — Z191 Hormone sensitive malignancy status: Secondary | ICD-10-CM | POA: Diagnosis not present

## 2022-06-20 DIAGNOSIS — Z510 Encounter for antineoplastic radiation therapy: Secondary | ICD-10-CM | POA: Diagnosis not present

## 2022-06-27 DIAGNOSIS — Z191 Hormone sensitive malignancy status: Secondary | ICD-10-CM | POA: Diagnosis not present

## 2022-06-27 DIAGNOSIS — Z510 Encounter for antineoplastic radiation therapy: Secondary | ICD-10-CM | POA: Diagnosis not present

## 2022-07-01 DIAGNOSIS — Z510 Encounter for antineoplastic radiation therapy: Secondary | ICD-10-CM | POA: Diagnosis not present

## 2022-07-01 DIAGNOSIS — Z191 Hormone sensitive malignancy status: Secondary | ICD-10-CM | POA: Diagnosis not present

## 2022-07-05 DIAGNOSIS — Z89612 Acquired absence of left leg above knee: Secondary | ICD-10-CM | POA: Diagnosis not present

## 2022-07-08 DIAGNOSIS — Z510 Encounter for antineoplastic radiation therapy: Secondary | ICD-10-CM | POA: Diagnosis not present

## 2022-07-08 DIAGNOSIS — Z191 Hormone sensitive malignancy status: Secondary | ICD-10-CM | POA: Diagnosis not present

## 2022-07-09 DIAGNOSIS — Z191 Hormone sensitive malignancy status: Secondary | ICD-10-CM | POA: Diagnosis not present

## 2022-07-09 DIAGNOSIS — Z510 Encounter for antineoplastic radiation therapy: Secondary | ICD-10-CM | POA: Diagnosis not present

## 2022-07-10 DIAGNOSIS — Z510 Encounter for antineoplastic radiation therapy: Secondary | ICD-10-CM | POA: Diagnosis not present

## 2022-07-10 DIAGNOSIS — Z191 Hormone sensitive malignancy status: Secondary | ICD-10-CM | POA: Diagnosis not present

## 2022-07-11 DIAGNOSIS — Z191 Hormone sensitive malignancy status: Secondary | ICD-10-CM | POA: Diagnosis not present

## 2022-07-11 DIAGNOSIS — Z510 Encounter for antineoplastic radiation therapy: Secondary | ICD-10-CM | POA: Diagnosis not present

## 2022-07-12 ENCOUNTER — Ambulatory Visit: Payer: 59 | Attending: Orthopedic Surgery | Admitting: Physical Therapy

## 2022-07-12 ENCOUNTER — Other Ambulatory Visit: Payer: Self-pay

## 2022-07-12 ENCOUNTER — Encounter: Payer: Self-pay | Admitting: Physical Therapy

## 2022-07-12 VITALS — BP 174/75 | HR 70

## 2022-07-12 DIAGNOSIS — R2689 Other abnormalities of gait and mobility: Secondary | ICD-10-CM | POA: Diagnosis not present

## 2022-07-12 DIAGNOSIS — Z89612 Acquired absence of left leg above knee: Secondary | ICD-10-CM | POA: Diagnosis not present

## 2022-07-12 DIAGNOSIS — M6281 Muscle weakness (generalized): Secondary | ICD-10-CM | POA: Insufficient documentation

## 2022-07-12 DIAGNOSIS — R2681 Unsteadiness on feet: Secondary | ICD-10-CM | POA: Insufficient documentation

## 2022-07-12 DIAGNOSIS — Z510 Encounter for antineoplastic radiation therapy: Secondary | ICD-10-CM | POA: Diagnosis not present

## 2022-07-12 DIAGNOSIS — Z191 Hormone sensitive malignancy status: Secondary | ICD-10-CM | POA: Diagnosis not present

## 2022-07-12 NOTE — Therapy (Signed)
OUTPATIENT PHYSICAL THERAPY PROSTHETICS EVALUATION   Patient Name: Tim Vincent MRN: [id number] DOB:1955/12/15, 67 y.o., male Today's Date: 07/12/2022  PCP: Filomena Jungling, NP REFERRING PROVIDER: Nadara Mustard, MD  END OF SESSION:  PT End of Session - 07/12/22 1333     Visit Number 1    Number of Visits 17   16 + eval   Date for PT Re-Evaluation 09/13/22   pushed out due to frequency and scheduling conflicts w/ radiation therapy   Authorization Type [employer]    Progress Note Due on Visit 10    PT Start Time 1325   pt arrived late   PT Stop Time 1426    PT Time Calculation (min) 61 min    Equipment Utilized During Treatment Gait belt    Behavior During Therapy WFL for tasks assessed/performed             Past Medical History:  Diagnosis Date   Diabetes mellitus without complication (HCC)    Hypertension    Past Surgical History:  Procedure Laterality Date   AMPUTATION Left 04/17/2022   Procedure: LEFT ABOVE KNEE AMPUTATION;  Surgeon: Nadara Mustard, MD;  Location: [facility];  Service: Orthopedics;  Laterality: Left;   MASS EXCISION Left 04/17/2021   Procedure: EXCISION LEFT ELBOW MASS;  Surgeon: Allena Napoleon, MD;  Location: [facility];  Service: Plastics;  Laterality: Left;  1 hour   MASS EXCISION Left 08/10/2021   Procedure: EXCISION ELBOW MASS;  Surgeon: Allena Napoleon, MD;  Location: [facility];  Service: Plastics;  Laterality: Left;  1 hour   Patient Active Problem List   Diagnosis Date Noted   Diabetic infection of left foot (HCC) 04/17/2022   Necrotizing fasciitis of lower leg (HCC) 04/17/2022   Dyslipidemia 04/17/2022   Essential hypertension 01/12/2021   Obesity 01/12/2021   Skin sensation disturbance 01/12/2021   Type 2 diabetes mellitus without complication (HCC) 01/12/2021   Diabetic neuropathy (HCC) 01/12/2021   Hav (hallux abducto valgus), unspecified laterality 01/12/2021   Cough 10/31/2017   Elevated  blood pressure reading 10/31/2017    ONSET DATE: 04/17/2022 (date of amputation)  REFERRING DIAG: W09.811 (ICD-10-CM) - S/P above knee amputation, left (HCC)  THERAPY DIAG:  Other abnormalities of gait and mobility  Muscle weakness (generalized)  Unsteadiness on feet  Rationale for Evaluation and Treatment: Rehabilitation  SUBJECTIVE:   SUBJECTIVE STATEMENT: He is having trouble walking and wants to learn how to use the prosthetic.  He has been having hot flashes since starting radiation. Pt accompanied by: family member-daughter [name] and grandchild  PERTINENT HISTORY: HTN, DM2, CKD, prostate cancer undergoing radiation treatment and androgen deprivation therapy  PAIN:  Are you having pain? No  PRECAUTIONS: Fall; pt is having radiation therapy M-F in the mornings  WEIGHT BEARING RESTRICTIONS: No  FALLS: Has patient fallen in last 6 months? Yes. Number of falls 2-his did not have prosthetic donned, thought his foot was there and tried to step and went down  LIVING ENVIRONMENT: Lives with: lives with their family and lives with their spouse-2 adult children, 1 son-in-law, 3 infant children Lives in: House/apartment Home Access: Stairs to enter Home layout: One level Stairs: Yes: External: 4 steps; bilateral but cannot reach both Has following equipment at home: Dan Humphreys - 2 wheeled, Wheelchair (manual), and shower chair  PLOF: Independent with household mobility with device, Independent with transfers, Requires assistive device for independence, and Needs assistance with homemaking  PATIENT GOALS: "  To walk better and learn to use the prosthetic."  OBJECTIVE:   DIAGNOSTIC FINDINGS: No recent relevant findings.  COGNITION: Overall cognitive status: Within functional limits for tasks assessed   SENSATION: Light touch: WFL  POSTURE: No Significant postural limitations; pt has right mass superior to olecranon (per MD notes related to gout)  LOWER EXTREMITY  ROM:  Active ROM Right eval Left eval  Hip flexion WNL WFL  Hip extension    Hip abduction    Hip adduction    Hip internal rotation    Hip external rotation    Knee flexion    Knee extension    Ankle dorsiflexion    Ankle plantarflexion    Ankle inversion    Ankle eversion     (Blank rows = not tested)  LOWER EXTREMITY MMT:  MMT Right eval Left eval  Hip flexion 4+/5 4/5  Hip extension    Hip abduction 4/5 3+/5  Hip adduction 5/5 3/5  Hip internal rotation    Hip external rotation    Knee flexion 5/5   Knee extension 5/5   Ankle dorsiflexion 5/5   Ankle plantarflexion    Ankle inversion    Ankle eversion    (Blank rows = not tested)  BED MOBILITY:  Sit to supine Complete Independence Supine to sit Complete Independence Rolling to Right Complete Independence Rolling to Left Complete Independence  TRANSFERS: Sit to stand: Complete Independence Stand to sit: Complete Independence Floor transfers: daughter states family assisted when pt fell to floor  GAIT: Gait pattern: step to pattern, decreased hip/knee flexion- Left, and trunk flexed Distance walked: 405' + various clinic distances Assistive device utilized:  standard no wheel walker Level of assistance: SBA and CGA Gait velocity: 1.18 ft/sec Comments: Pt traverses too far forward into walker w/ intermittent misplacement of prosthetic causing toe catch x2.  He advances standard walker too far forward during each stride resulting in large step-to stride pattern.  FUNCTIONAL TESTS:  5 times sit to stand: 16.45 seconds w/ BUE support-transfers UE support to RW, needed restart x1 due to instability initially 6 minute walk test: 405' w/ standard walker 10 meter walk test: 28.07 seconds w/ standard walker = 0.36 m/sec OR 1.18 ft/sec Berg Balance Scale: To be assessed.  CURRENT PROSTHETIC WEAR ASSESSMENT: Patient is independent with: residual limb care, care of non-amputated limb, prosthetic cleaning, and ply  sock cleaning Patient is dependent with: skin check, correct ply sock adjustment, proper wear schedule/adjustment, and proper weight-bearing schedule/adjustment Donning prosthesis: Complete Independence Doffing prosthesis: Complete Independence Prosthetic wear tolerance: 2-6 hours inconsistently/day, 7 days/week (has been tired so inconsistent following radiation this week) Prosthetic weight bearing tolerance: ~15 minutes (mostly ambulating in home) Edema: no visible swelling on eval, has stopped wearing shrinker. Residual limb condition: Sweat/moist, clean, well-shaped, mild invagination of scar that is well healed, less hair than proximal leg, warm. Prosthetic description:  ischial containment suction socket w/ flexible inner liner w/ polycentric 4-bar knee w/ pneumatic swing control and fixed ankle SACH foot K code/activity level with prosthetic use: Level 3  PATIENT SURVEYS:  None completed due to time  TODAY'S TREATMENT:  DATE: N/A  TREATMENT:  PATIENT EDUCATED ON FOLLOWING PROSTHETIC CARE: Prosthetic wear tolerance: 2 hours/day in morning and evening, 7 days/week Prosthetic weight bearing tolerance: continue 15 minutes Other education  Skin check, Residual limb care, Prosthetic cleaning, Proper wear schedule/adjustment, and Proper weight-bearing schedule/adjustment  PATIENT EDUCATION: Education details: Educated on wearing shrinker when prosthetic not donned to prevent excessive swelling and maintain limb shape.  Discussed antiperspirant spray-pt to call [employer].  Safety w/ walker approximation and decreased stride as well as ensuring prosthetic knee straight prior to stance phase to prevent buckling.  Discussed progression to LRAD over time as safe and building tolerance to activity w/ prosthetic and confidence in function of prosthetic.  Discussed  socket fit w/ pt and daughter and ensuring he is all the way in the socket on standing to ensure proper suction-did educate on likelihood of limb shrinking during walking w/ suction suspension.  Please take BP medicine prior to next session.  Outcome interpretations.  PT POC, assessments used, and goals to be set. Person educated: Patient and Child(ren) Education method: Medical illustrator Education comprehension: verbalized understanding and needs further education  HOME EXERCISE PROGRAM: To be established.  ASSESSMENT:  CLINICAL IMPRESSION: Patient is a 67 y.o. male who was seen today for physical therapy evaluation and treatment for left above knee amputation on 04/17/2022 w/ recent receipt of prosthetic.  Pt has a significant PMH of HTN, DM2, CKD, and prostate cancer undergoing radiation treatment and androgen deprivation therapy.  Identified impairments include proximal weakness, difficulty walking with prosthetic, decreased endurance, impaired standing balance, and history of falls w/o prosthetic donned.  Evaluation via the following assessment tools: 5xSTS, , and indicate elevated fall risk and diminished activity tolerance.  BERG to be further assessed to identify standing balance impairments for appropriate intervention.  He would benefit from skilled PT to address impairments as noted and progress towards long term goals.  OBJECTIVE IMPAIRMENTS: Abnormal gait, decreased activity tolerance, decreased balance, decreased coordination, decreased endurance, decreased knowledge of use of DME, decreased mobility, difficulty walking, decreased ROM, decreased strength, decreased safety awareness, increased edema, improper body mechanics, and prosthetic dependency .   ACTIVITY LIMITATIONS: carrying, lifting, bending, standing, squatting, stairs, transfers, locomotion level, and caring for others  PARTICIPATION LIMITATIONS: meal prep, cleaning, laundry, driving, shopping, community  activity, and yard work  PERSONAL FACTORS: Age, Education, Fitness, Past/current experiences, Transportation, and 3+ comorbidities: HTN, CKD, DM2, and prostate cancer w/ ongoing radiation  are also affecting patient's functional outcome.   REHAB POTENTIAL: Good  CLINICAL DECISION MAKING: Evolving/moderate complexity  EVALUATION COMPLEXITY: Moderate   GOALS: Goals reviewed with patient? Yes  SHORT TERM GOALS: Target date: 08/09/2022  Pt will be independent and compliant with initial strength and balance HEP to improve functional mobility and prosthetic management. Baseline:  To be established. Goal status: INITIAL  2.  Pt will decrease 5xSTS to </=13 seconds w/o UE support in order to demonstrate decreased risk for falls and improved functional bilateral LE strength and power. Baseline: 16.45 sec w/ BUE support Goal status: INITIAL  3.  Pt will ambulate >/=605 feet on to demonstrate improved functional endurance for home and community participation. Baseline:  405' continuously w/ standard walker Goal status: INITIAL  4.  Pt will demonstrate a gait speed of >/=1.48 feet/sec in order to decrease risk for falls. Baseline: 1.18 ft/sec w/ standard walker Goal status: INITIAL  5.  BERG to be assessed w/ goal set as appropriate. Baseline:  To be assessed. Goal status:  INITIAL  LONG TERM GOALS: Target date: 09/06/2022  Patient to maintain established walking program x3 days per week to improve cardiovascular tolerance and functional mobility w/ prosthesis and LRAD as needed. Baseline:  To be established. Goal status: INITIAL  2.  Pt will ambulate >/=805 feet on to demonstrate improved functional endurance for home and community participation. Baseline: 405' continuously w/ standard walker Goal status: INITIAL  3.  Pt will demonstrate a gait speed of >/=1.78 feet/sec in order to decrease risk for falls. Baseline: 1.18 ft/sec w/ standard walker Goal status: INITIAL  4.   BERG to be assessed w/ goal set as appropriate. Baseline:  To be assessed. Goal status: INITIAL  5.  Pt will ambulate >/=250 feet over level surfaces using quad cane for improved access to home environment. Baseline: To be initiated. Goal status: INITIAL  6.  Pt will navigate 4 stairs SBA using single rail and cane as necessary to improve entry to home environment. Baseline:  To be assessed. Goal status: INITIAL   PLAN:  PT FREQUENCY: 2x/week  PT DURATION: 8 weeks  PLANNED INTERVENTIONS: Therapeutic exercises, Therapeutic activity, Neuromuscular re-education, Balance training, Gait training, Patient/Family education, Self Care, Joint mobilization, Stair training, Vestibular training, Prosthetic training, DME instructions, Manual therapy, and Re-evaluation  PLAN FOR NEXT SESSION: Assess BERG-set STG/LTG.  Assess left BP-did he take meds?  Sink HEP and other functional hip strengthening, walking program.  Gait training w/ walker safety-did they bring RW?  Can try clinic RW for use at home.  Working towards quad cane for level ground and stairs.  Sadie Haber, PT, DPT 07/12/2022, 2:29 PM

## 2022-07-15 ENCOUNTER — Ambulatory Visit: Payer: 59 | Admitting: Physical Therapy

## 2022-07-15 DIAGNOSIS — R2689 Other abnormalities of gait and mobility: Secondary | ICD-10-CM | POA: Diagnosis not present

## 2022-07-15 DIAGNOSIS — M6281 Muscle weakness (generalized): Secondary | ICD-10-CM | POA: Diagnosis not present

## 2022-07-15 DIAGNOSIS — R2681 Unsteadiness on feet: Secondary | ICD-10-CM

## 2022-07-15 DIAGNOSIS — Z191 Hormone sensitive malignancy status: Secondary | ICD-10-CM | POA: Diagnosis not present

## 2022-07-15 DIAGNOSIS — Z510 Encounter for antineoplastic radiation therapy: Secondary | ICD-10-CM | POA: Diagnosis not present

## 2022-07-15 DIAGNOSIS — Z89612 Acquired absence of left leg above knee: Secondary | ICD-10-CM | POA: Diagnosis not present

## 2022-07-15 NOTE — Therapy (Signed)
OUTPATIENT PHYSICAL THERAPY PROSTHETICS TREATMENT   Patient Name: Tim Vincent MRN: [id number] DOB:05-23-1955, 67 y.o., male Today's Date: 07/15/2022  PCP: Tim Jungling, NP REFERRING PROVIDER: Nadara Mustard, MD  END OF SESSION:  PT End of Session - 07/15/22 1324     Visit Number 2    Number of Visits 17   16 + eval   Date for PT Re-Evaluation 09/13/22   pushed out due to frequency and scheduling conflicts w/ radiation therapy   Authorization Type [employer]    Progress Note Due on Visit 10    PT Start Time 1322   pt arrived late   PT Stop Time 1402    PT Time Calculation (min) 40 min    Equipment Utilized During Treatment Gait belt    Activity Tolerance Patient tolerated treatment well    Behavior During Therapy WFL for tasks assessed/performed              Past Medical History:  Diagnosis Date   Diabetes mellitus without complication (HCC)    Hypertension    Past Surgical History:  Procedure Laterality Date   AMPUTATION Left 04/17/2022   Procedure: LEFT ABOVE KNEE AMPUTATION;  Surgeon: Tim Mustard, MD;  Location[facility];  Service: Orthopedics;  Laterality: Left;   MASS EXCISION Left 04/17/2021   Procedure: EXCISION LEFT ELBOW MASS;  Surgeon: Tim Napoleon, MD;  Location: [facility];  Service: Plastics;  Laterality: Left;  1 hour   MASS EXCISION Left 08/10/2021   Procedure: EXCISION ELBOW MASS;  Surgeon: Tim Napoleon, MD;  Location: [facility];  Service: Plastics;  Laterality: Left;  1 hour   Patient Active Problem List   Diagnosis Date Noted   Diabetic infection of left foot (HCC) 04/17/2022   Necrotizing fasciitis of lower leg (HCC) 04/17/2022   Dyslipidemia 04/17/2022   Essential hypertension 01/12/2021   Obesity 01/12/2021   Skin sensation disturbance 01/12/2021   Type 2 diabetes mellitus without complication (HCC) 01/12/2021   Diabetic neuropathy (HCC) 01/12/2021   Hav (hallux abducto valgus),  unspecified laterality 01/12/2021   Cough 10/31/2017   Elevated blood pressure reading 10/31/2017    ONSET DATE: 04/17/2022 (date of amputation)  REFERRING DIAG: W09.811 (ICD-10-CM) - S/P above knee amputation, left (HCC)  THERAPY DIAG:  Other abnormalities of gait and mobility  Muscle weakness (generalized)  Unsteadiness on feet  Rationale for Evaluation and Treatment: Rehabilitation  SUBJECTIVE:   SUBJECTIVE STATEMENT: Pt reports no pain today. Pt's daughter assists him with donning LLE prosthetic at beginning of session. Pt's daughter reports that she did call [facility] about anti-perspirant for pt's residual limb, is waiting on a call back from his prosthetist. Pt reports he has been wearing his prosthetic for up to 6 hours per day (in blocks of time) and walks about 1 hour each day. Pt reports he is inspecting his limb and has not noticed any pain, redness, or other skin breakdown.   Pt accompanied by: family member-daughter [name]; Spanish-language interpreter [name]  PERTINENT HISTORY: HTN, DM2, CKD, prostate cancer undergoing radiation treatment and androgen deprivation therapy  PAIN:  Are you having pain? No  PRECAUTIONS: Fall; pt is having radiation therapy M-F in the mornings  WEIGHT BEARING RESTRICTIONS: No  FALLS: Has patient fallen in last 6 months? Yes. Number of falls 2-his did not have prosthetic donned, thought his foot was there and tried to step and went down  LIVING ENVIRONMENT: Lives with: lives with their  family and lives with their spouse-2 adult children, 1 son-in-law, 3 infant children Lives in: House/apartment Home Access: Stairs to enter Home layout: One level Stairs: Yes: External: 4 steps; bilateral but cannot reach both Has following equipment at home: Dan Humphreys - 2 wheeled, Wheelchair (manual), and shower chair  PLOF: Independent with household mobility with device, Independent with transfers, Requires assistive device for independence, and Needs  assistance with homemaking  PATIENT GOALS: "To walk better and learn to use the prosthetic."  OBJECTIVE:   DIAGNOSTIC FINDINGS: No recent relevant findings.  COGNITION: Overall cognitive status: Within functional limits for tasks assessed   SENSATION: Light touch: WFL  POSTURE: No Significant postural limitations; pt has right mass superior to olecranon (per MD notes related to gout)  LOWER EXTREMITY ROM:  Active ROM Right eval Left eval  Hip flexion WNL WFL  Hip extension    Hip abduction    Hip adduction    Hip internal rotation    Hip external rotation    Knee flexion    Knee extension    Ankle dorsiflexion    Ankle plantarflexion    Ankle inversion    Ankle eversion     (Blank rows = not tested)  LOWER EXTREMITY MMT:  MMT Right eval Left eval  Hip flexion 4+/5 4/5  Hip extension    Hip abduction 4/5 3+/5  Hip adduction 5/5 3/5  Hip internal rotation    Hip external rotation    Knee flexion 5/5   Knee extension 5/5   Ankle dorsiflexion 5/5   Ankle plantarflexion    Ankle inversion    Ankle eversion    (Blank rows = not tested)  BED MOBILITY:  Sit to supine Complete Independence Supine to sit Complete Independence Rolling to Right Complete Independence Rolling to Left Complete Independence  TRANSFERS: Sit to stand: Complete Independence Stand to sit: Complete Independence Floor transfers: daughter states family assisted when pt fell to floor  GAIT: Gait pattern: step to pattern, decreased hip/knee flexion- Left, and trunk flexed Distance walked: 405' + various clinic distances Assistive device utilized:  standard no wheel walker Level of assistance: SBA and CGA Gait velocity: 1.18 ft/sec Comments: Pt traverses too far forward into walker w/ intermittent misplacement of prosthetic causing toe catch x2.  He advances standard walker too far forward during each stride resulting in large step-to stride pattern.  FUNCTIONAL TESTS:  5 times sit to  stand: 16.45 seconds w/ BUE support-transfers UE support to RW, needed restart x1 due to instability initially 6 minute walk test: 405' w/ standard walker 10 meter walk test: 28.07 seconds w/ standard walker = 0.36 m/sec OR 1.18 ft/sec Berg Balance Scale: To be assessed.  CURRENT PROSTHETIC WEAR ASSESSMENT: Patient is independent with: residual limb care, care of non-amputated limb, prosthetic cleaning, and ply sock cleaning Patient is dependent with: skin check, correct ply sock adjustment, proper wear schedule/adjustment, and proper weight-bearing schedule/adjustment Donning prosthesis: Complete Independence Doffing prosthesis: Complete Independence Prosthetic wear tolerance: 2-6 hours inconsistently/day, 7 days/week (has been tired so inconsistent following radiation this week) Prosthetic weight bearing tolerance: ~15 minutes (mostly ambulating in home) Edema: no visible swelling on eval, has stopped wearing shrinker. Residual limb condition: Sweat/moist, clean, well-shaped, mild invagination of scar that is well healed, less hair than proximal leg, warm. Prosthetic description:  ischial containment suction socket w/ flexible inner liner w/ polycentric 4-bar knee w/ pneumatic swing control and fixed ankle SACH foot K code/activity level with prosthetic use: Level 3  TODAY'S TREATMENT:                                                                                                                                         TherEx Pt states he has not been performing any exercises, was not given any in the hospital. Educated pt to start working on prone hip flexor stretch, provided handout with picture for patient (unable to print in Spanish), see bolded below.  TherAct For goal assessment:  [facility] PT Assessment - 07/15/22 1331       Standardized Balance Assessment   Standardized Balance Assessment Berg Balance Test      Berg Balance Test   Sit to Stand Able to stand without using hands  and stabilize independently    Standing Unsupported Able to stand 2 minutes with supervision    Sitting with Back Unsupported but Feet Supported on Floor or Stool Able to sit safely and securely 2 minutes    Stand to Sit Sits safely with minimal use of hands    Transfers Able to transfer safely, definite need of hands    Standing Unsupported with Eyes Closed Able to stand 10 seconds with supervision    Standing Unsupported with Feet Together Able to place feet together independently and stand for 1 minute with supervision    From Standing, Reach Forward with Outstretched Arm Loses balance while trying/requires external support    From Standing Position, Pick up Object from Floor Unable to try/needs assist to keep balance    From Standing Position, Turn to Look Behind Over each Shoulder Needs assist to keep from losing balance and falling    Turn 360 Degrees Needs assistance while turning    Standing Unsupported, Alternately Place Feet on Step/Stool Needs assistance to keep from falling or unable to try    Standing Unsupported, One Foot in [employer] balance while stepping or standing    Standing on One Leg Unable to try or needs assist to prevent fall    Total Score 24    Berg comment: 24/56, high fall risk             Gait Gait pattern:  occasionally takes too large of a step with LLE, good heel strike with LLE, shoulders elevated but improves with cues Distance walked: 600 ft Assistive device utilized: Environmental consultant - 2 wheeled Level of assistance: CGA Comments: trial gait with RW during session as compared to standard RW, pt exhibits much more efficient gait with use of RW, encouraged him to continue to use his RW at home (pt prefers to use his SW outdoors and RW indoors)    PATIENT EDUCATION: Education details: results of Airline pilot and functional implications; initiated HEP Person educated: Patient and Child(ren) Education method: Explanation, Demonstration, and Handouts Education  comprehension: verbalized understanding and needs further education  HOME EXERCISE PROGRAM: Access Code: [email or web address] URL: [email or web address] Date: 07/15/2022 Prepared by: Ladona Ridgel  Jedrick Hutcherson  Exercises - Prone Hip Flexor Stretch with Towel Roll (AKA)  - 1 x daily - 7 x weekly - 1 sets - 1 reps - 10 minutes hold  ASSESSMENT:  CLINICAL IMPRESSION: Emphasis of skilled PT session on assessing Berg and writing STG/LTG, assessing gait with RW, and initiating HEP. Patient demonstrates increased fall risk as noted by score of  24/56 on Berg Balance Scale.  (<36= high risk for falls, close to 100%; 37-45 significant >80%; 46-51 moderate >50%; 52-55 lower >25%). Educated pt and his daughter on score and functional implications and why therapy continues to recommend use of RW at this time for most safety with his mobility. Pt exhibits overall good gait mechanics with use of RW this session, does tend to take too large of a step with his LLE but able to correct and maintain his balance. Pt continues to benefit from skilled therapy services to work towards improving his gait mechanics, improving his balance, and improving his LLE strength in order to improve his safety and independence with functional mobility. Continue POC.   OBJECTIVE IMPAIRMENTS: Abnormal gait, decreased activity tolerance, decreased balance, decreased coordination, decreased endurance, decreased knowledge of use of DME, decreased mobility, difficulty walking, decreased ROM, decreased strength, decreased safety awareness, increased edema, improper body mechanics, and prosthetic dependency .   ACTIVITY LIMITATIONS: carrying, lifting, bending, standing, squatting, stairs, transfers, locomotion level, and caring for others  PARTICIPATION LIMITATIONS: meal prep, cleaning, laundry, driving, shopping, community activity, and yard work  PERSONAL FACTORS: Age, Education, Fitness, Past/current experiences, Transportation, and 3+  comorbidities: HTN, CKD, DM2, and prostate cancer w/ ongoing radiation  are also affecting patient's functional outcome.   REHAB POTENTIAL: Good  CLINICAL DECISION MAKING: Evolving/moderate complexity  EVALUATION COMPLEXITY: Moderate   GOALS: Goals reviewed with patient? Yes  SHORT TERM GOALS: Target date: 08/09/2022  Pt will be independent and compliant with initial strength and balance HEP to improve functional mobility and prosthetic management. Baseline:  To be established. Goal status: INITIAL  2.  Pt will decrease 5xSTS to </=13 seconds w/o UE support in order to demonstrate decreased risk for falls and improved functional bilateral LE strength and power. Baseline: 16.45 sec w/ BUE support Goal status: INITIAL  3.  Pt will ambulate >/=605 feet on to demonstrate improved functional endurance for home and community participation. Baseline:  405' continuously w/ standard walker Goal status: INITIAL  4.  Pt will demonstrate a gait speed of >/=1.48 feet/sec in order to decrease risk for falls. Baseline: 1.18 ft/sec w/ standard walker Goal status: INITIAL  5.  Pt will improve Berg score to 29/56 for decreased fall risk Baseline:  24/56 (5/6) Goal status: INITIAL  LONG TERM GOALS: Target date: 09/06/2022  Patient to maintain established walking program x3 days per week to improve cardiovascular tolerance and functional mobility w/ prosthesis and LRAD as needed. Baseline:  To be established. Goal status: INITIAL  2.  Pt will ambulate >/=805 feet on to demonstrate improved functional endurance for home and community participation. Baseline: 405' continuously w/ standard walker Goal status: INITIAL  3.  Pt will demonstrate a gait speed of >/=1.78 feet/sec in order to decrease risk for falls. Baseline: 1.18 ft/sec w/ standard walker Goal status: INITIAL  4.  Pt will improve Berg score to 34/56 for decreased fall risk Baseline:  24/56 (5/6) Goal status:  INITIAL  5.  Pt will ambulate >/=250 feet over level surfaces using quad cane for improved access to home environment. Baseline: To  be initiated. Goal status: INITIAL  6.  Pt will navigate 4 stairs SBA using single rail and cane as necessary to improve entry to home environment. Baseline:  To be assessed. Goal status: INITIAL   PLAN:  PT FREQUENCY: 2x/week  PT DURATION: 8 weeks  PLANNED INTERVENTIONS: Therapeutic exercises, Therapeutic activity, Neuromuscular re-education, Balance training, Gait training, Patient/Family education, Self Care, Joint mobilization, Stair training, Vestibular training, Prosthetic training, DME instructions, Manual therapy, and Re-evaluation  PLAN FOR NEXT SESSION: Assess left BP-did he take meds?  Sink HEP and other functional hip strengthening, walking program.  Gait training w/ walker safety-did they bring RW?  Can try clinic RW for use at home.  Working towards quad cane for level ground and stairs.  Peter Congo, PT, DPT, CSRS 07/15/2022, 2:04 PM

## 2022-07-16 ENCOUNTER — Other Ambulatory Visit (INDEPENDENT_AMBULATORY_CARE_PROVIDER_SITE_OTHER): Payer: 59

## 2022-07-16 ENCOUNTER — Ambulatory Visit (INDEPENDENT_AMBULATORY_CARE_PROVIDER_SITE_OTHER): Payer: 59 | Admitting: Family

## 2022-07-16 DIAGNOSIS — Z510 Encounter for antineoplastic radiation therapy: Secondary | ICD-10-CM | POA: Diagnosis not present

## 2022-07-16 DIAGNOSIS — M7661 Achilles tendinitis, right leg: Secondary | ICD-10-CM

## 2022-07-16 DIAGNOSIS — M25561 Pain in right knee: Secondary | ICD-10-CM | POA: Diagnosis not present

## 2022-07-16 DIAGNOSIS — M25571 Pain in right ankle and joints of right foot: Secondary | ICD-10-CM

## 2022-07-16 DIAGNOSIS — Z191 Hormone sensitive malignancy status: Secondary | ICD-10-CM | POA: Diagnosis not present

## 2022-07-16 DIAGNOSIS — M10061 Idiopathic gout, right knee: Secondary | ICD-10-CM | POA: Diagnosis not present

## 2022-07-16 DIAGNOSIS — M7751 Other enthesopathy of right foot: Secondary | ICD-10-CM | POA: Diagnosis not present

## 2022-07-16 MED ORDER — PREDNISONE 10 MG PO TABS: 10.0000 mg | ORAL_TABLET | Freq: Every day | ORAL | 0 refills | Status: DC

## 2022-07-16 NOTE — Progress Notes (Unsigned)
Office Visit Note   Patient: Tim Vincent           Date of Birth: Jun 28, 1955           MRN: [id number] Visit Date: 07/16/2022              Requested by: Filomena Jungling, NP [address] [place],  [place] [address] PCP: Filomena Jungling, NP  Chief Complaint  Patient presents with   Right Ankle - Pain      HPI: The patient is a 67 year old gentleman who presents for 2 separate issues today he is complaining of right ankle pain and swelling specifically to the posterior ankle i.e. the heel.  He has been having pain which has been waxing and waning but overall worsening over the last 1 month.  Describes as moderate to severe there is swelling and tenderness pain with wiggling the ankle some mild worsening with standing however he has not been very mobile.  He is status post recent left above-knee amputation to get his prosthesis 2 weeks ago so has recently began gait training.  No fever no chills no injury  Also complaining of right knee pain pain to the knee Deep knee pain some tenderness to the anterior knee.  Does have a history of gout.  Reports this feels similar to previous episodes of gout.  He continues on allopurinol as prescribed by his primary care provider, Edwards.  However he was advised by nephrology to stop using colchicine this episode has been worsening over the last month as well  Assessment & Plan: Visit Diagnoses:  1. Retrocalcaneal bursitis (back of heel), right   2. Acute pain of right knee   3. Pain in right ankle and joints of right foot   4. Achilles tendinitis, right leg   5. Acute idiopathic gout of right knee     Plan: Will trial a course of prednisone given a heel lift to use in the right shoe to rest the Achilles.  Retrocalcaneal bursitis.  Consider injection at next visit. Last uric acid 7.8.  He will continue on his allopurinol discussed that they may use colchicine for 3 days the prednisone as well for this gout flare.  Follow-Up  Instructions: No follow-ups on file.   Right Ankle Exam  Right ankle exam is normal.   Comments:  Retrocalcaneal bursitis with fluctuance there is no erythema or warmth.  The about 2 cm proximal to this there is a cord in the Achilles about the size of a blueberry this is tender less tender than the retrocalcaneal bursitis.  Without warmth   Right Knee Exam   Tenderness  The patient is experiencing tenderness in the patella and patellar tendon.  Range of Motion  The patient has normal right knee ROM.  Tests  Varus: negative Valgus: negative  Other  Erythema: present Swelling: mild Effusion: no effusion present      Patient is alert, oriented, no adenopathy, well-dressed, normal affect, normal respiratory effort.   Imaging: No results found. No images are attached to the encounter.  Labs: Lab Results  Component Value Date   HGBA1C 6.3 (H) 04/17/2022   LABURIC 7.8 04/21/2022   REPTSTATUS 04/21/2022 FINAL 04/17/2022   GRAMSTAIN  04/17/2022    NO WBC SEEN FEW GRAM NEGATIVE RODS FEW GRAM POSITIVE COCCI    CULT  04/17/2022    FEW ENTEROCOCCUS FAECALIS FEW STREPTOCOCCUS MITIS/ORALIS MODERATE PREVOTELLA BIVIA BETA LACTAMASE POSITIVE Performed at [facility], [address], [place], [place] [address]    [facility] ENTEROCOCCUS FAECALIS 04/17/2022   LABORGA STREPTOCOCCUS MITIS/ORALIS 04/17/2022     Lab Results  Component Value Date   ALBUMIN 2.5 (L) 04/16/2022    Lab Results  Component Value Date   MG 2.1 04/18/2022   No results found for: "VD25OH"  No results found for: "PREALBUMIN"    Latest Ref Rng & Units 04/21/2022    2:19 AM 04/19/2022    2:17 AM 04/18/2022    1:58 AM  CBC EXTENDED  WBC 4.0 - 10.5 K/uL 10.6  12.4  11.4   RBC 4.22 - 5.81 MIL/uL 3.87  3.50  3.32   Hemoglobin 13.0 - 17.0 g/dL 19.1  47.8  9.9   HCT 29.5 - 52.0 % 35.8  32.4  30.7   Platelets 150 - 400 K/uL 304  299  250      There is no height or weight on file to calculate  BMI.  Orders:  Orders Placed This Encounter  Procedures   XR Ankle Complete Right   XR Knee 1-2 Views Right   No orders of the defined types were placed in this encounter.    Procedures: No procedures performed  Clinical Data: No additional findings.  ROS:  All other systems negative, except as noted in the HPI. Review of Systems  Objective: Vital Signs: There were no vitals taken for this visit.  Specialty Comments:  No specialty comments available.  PMFS History: Patient Active Problem List   Diagnosis Date Noted   Diabetic infection of left foot (HCC) 04/17/2022   Necrotizing fasciitis of lower leg (HCC) 04/17/2022   Dyslipidemia 04/17/2022   Essential hypertension 01/12/2021   Obesity 01/12/2021   Skin sensation disturbance 01/12/2021   Type 2 diabetes mellitus without complication (HCC) 01/12/2021   Diabetic neuropathy (HCC) 01/12/2021   Hav (hallux abducto valgus), unspecified laterality 01/12/2021   Cough 10/31/2017   Elevated blood pressure reading 10/31/2017   Past Medical History:  Diagnosis Date   Diabetes mellitus without complication (HCC)    Hypertension     No family history on file.  Past Surgical History:  Procedure Laterality Date   AMPUTATION Left 04/17/2022   Procedure: LEFT ABOVE KNEE AMPUTATION;  Surgeon: Nadara Mustard, MD;  Location: [facility] OR;  Service: Orthopedics;  Laterality: Left;   MASS EXCISION Left 04/17/2021   Procedure: EXCISION LEFT ELBOW MASS;  Surgeon: Allena Napoleon, MD;  Location: [facility];  Service: Plastics;  Laterality: Left;  1 hour   MASS EXCISION Left 08/10/2021   Procedure: EXCISION ELBOW MASS;  Surgeon: Allena Napoleon, MD;  Location: [facility];  Service: Plastics;  Laterality: Left;  1 hour   Social History   Occupational History   Not on file  Tobacco Use   Smoking status: Never   Smokeless tobacco: Never  Vaping Use   Vaping Use: Never used  Substance and Sexual Activity    Alcohol use: Yes    Alcohol/week: 6.0 standard drinks of alcohol    Types: 6 Cans of beer per week   Drug use: Never   Sexual activity: Not on file

## 2022-07-17 ENCOUNTER — Encounter: Payer: Self-pay | Admitting: Family

## 2022-07-17 DIAGNOSIS — M726 Necrotizing fasciitis: Secondary | ICD-10-CM | POA: Diagnosis not present

## 2022-07-17 DIAGNOSIS — Z510 Encounter for antineoplastic radiation therapy: Secondary | ICD-10-CM | POA: Diagnosis not present

## 2022-07-17 DIAGNOSIS — Z191 Hormone sensitive malignancy status: Secondary | ICD-10-CM | POA: Diagnosis not present

## 2022-07-17 DIAGNOSIS — E11628 Type 2 diabetes mellitus with other skin complications: Secondary | ICD-10-CM | POA: Diagnosis not present

## 2022-07-17 DIAGNOSIS — L089 Local infection of the skin and subcutaneous tissue, unspecified: Secondary | ICD-10-CM | POA: Diagnosis not present

## 2022-07-18 ENCOUNTER — Telehealth: Payer: Self-pay | Admitting: Physical Therapy

## 2022-07-18 ENCOUNTER — Ambulatory Visit: Payer: 59 | Admitting: Physical Therapy

## 2022-07-18 DIAGNOSIS — Z510 Encounter for antineoplastic radiation therapy: Secondary | ICD-10-CM | POA: Diagnosis not present

## 2022-07-18 DIAGNOSIS — Z191 Hormone sensitive malignancy status: Secondary | ICD-10-CM | POA: Diagnosis not present

## 2022-07-18 NOTE — Telephone Encounter (Signed)
Called primary contact line; per caregiver, patient arrived late to session outside of window of being seen and so was marked as no show. No other appointments were available for patient to be seen. Reminded of upcoming appointment on Monday.  Maryruth Eve, PT, DPT

## 2022-07-19 DIAGNOSIS — Z510 Encounter for antineoplastic radiation therapy: Secondary | ICD-10-CM | POA: Diagnosis not present

## 2022-07-19 DIAGNOSIS — Z191 Hormone sensitive malignancy status: Secondary | ICD-10-CM | POA: Diagnosis not present

## 2022-07-22 ENCOUNTER — Ambulatory Visit: Payer: 59 | Admitting: Physical Therapy

## 2022-07-22 ENCOUNTER — Encounter: Payer: Self-pay | Admitting: Physical Therapy

## 2022-07-22 VITALS — BP 193/83 | HR 77

## 2022-07-22 DIAGNOSIS — R2689 Other abnormalities of gait and mobility: Secondary | ICD-10-CM | POA: Diagnosis not present

## 2022-07-22 DIAGNOSIS — Z510 Encounter for antineoplastic radiation therapy: Secondary | ICD-10-CM | POA: Diagnosis not present

## 2022-07-22 DIAGNOSIS — M6281 Muscle weakness (generalized): Secondary | ICD-10-CM | POA: Diagnosis not present

## 2022-07-22 DIAGNOSIS — R2681 Unsteadiness on feet: Secondary | ICD-10-CM

## 2022-07-22 DIAGNOSIS — Z89612 Acquired absence of left leg above knee: Secondary | ICD-10-CM | POA: Diagnosis not present

## 2022-07-22 DIAGNOSIS — Z191 Hormone sensitive malignancy status: Secondary | ICD-10-CM | POA: Diagnosis not present

## 2022-07-22 NOTE — Therapy (Signed)
OUTPATIENT PHYSICAL THERAPY PROSTHETICS TREATMENT   Patient Name: Tim Vincent MRN: [id number] DOB:03/20/1955, 67 y.o., male Today's Date: 07/22/2022  PCP: Filomena Jungling, NP REFERRING PROVIDER: Nadara Mustard, MD  END OF SESSION:  PT End of Session - 07/22/22 1414     Visit Number 3    Number of Visits 17   16 + eval   Date for PT Re-Evaluation 09/13/22   pushed out due to frequency and scheduling conflicts w/ radiation therapy   Authorization Type [employer]    Progress Note Due on Visit 10    PT Start Time 1408   pt arrived late   PT Stop Time 1450    PT Time Calculation (min) 42 min    Equipment Utilized During Treatment Gait belt    Activity Tolerance Patient tolerated treatment well;Treatment limited secondary to medical complications (Comment)   Elevated systolic BP   Behavior During Therapy WFL for tasks assessed/performed              Past Medical History:  Diagnosis Date   Diabetes mellitus without complication (HCC)    Hypertension    Past Surgical History:  Procedure Laterality Date   AMPUTATION Left 04/17/2022   Procedure: LEFT ABOVE KNEE AMPUTATION;  Surgeon: Nadara Mustard, MD;  Location: [facility] OR;  Service: Orthopedics;  Laterality: Left;   MASS EXCISION Left 04/17/2021   Procedure: EXCISION LEFT ELBOW MASS;  Surgeon: Allena Napoleon, MD;  Location: [facility];  Service: Plastics;  Laterality: Left;  1 hour   MASS EXCISION Left 08/10/2021   Procedure: EXCISION ELBOW MASS;  Surgeon: Allena Napoleon, MD;  Location: [facility];  Service: Plastics;  Laterality: Left;  1 hour   Patient Active Problem List   Diagnosis Date Noted   Diabetic infection of left foot (HCC) 04/17/2022   Necrotizing fasciitis of lower leg (HCC) 04/17/2022   Dyslipidemia 04/17/2022   Essential hypertension 01/12/2021   Obesity 01/12/2021   Skin sensation disturbance 01/12/2021   Type 2 diabetes mellitus without complication (HCC)  01/12/2021   Diabetic neuropathy (HCC) 01/12/2021   Hav (hallux abducto valgus), unspecified laterality 01/12/2021   Cough 10/31/2017   Elevated blood pressure reading 10/31/2017    ONSET DATE: 04/17/2022 (date of amputation)  REFERRING DIAG: E95.284 (ICD-10-CM) - S/P above knee amputation, left (HCC)  THERAPY DIAG:  Other abnormalities of gait and mobility  Muscle weakness (generalized)  Unsteadiness on feet  Rationale for Evaluation and Treatment: Rehabilitation  SUBJECTIVE:   SUBJECTIVE STATEMENT: Pt reports left hip pain which is ongoing the more he walks and he feels his prosthetic may be too short.  His daughter goes with him to [place] and he has an appt tomorrow.  Pt reports he has been wearing his prosthetic for up to 8 hours per day (in blocks of time) and walks about 1 hour each day. Pt reports he is inspecting his limb and has not noticed any pain, redness, or other skin breakdown.   Pt accompanied by: family Doctor, hospital; Spanish-language interpreter Rosalyn Charters  PERTINENT HISTORY: HTN, DM2, CKD, prostate cancer undergoing radiation treatment and androgen deprivation therapy  PAIN:  Are you having pain? Yes: NPRS scale: 2/10 Pain location: left hip Pain description: sore Aggravating factors: walking Relieving factors: rest  PRECAUTIONS: Fall; pt is having radiation therapy M-F in the mornings  WEIGHT BEARING RESTRICTIONS: No  FALLS: Has patient fallen in last 6 months? Yes. Number of falls 2-his did not  have prosthetic donned, thought his foot was there and tried to step and went down  LIVING ENVIRONMENT: Lives with: lives with their family and lives with their spouse-2 adult children, 1 son-in-law, 3 infant children Lives in: House/apartment Home Access: Stairs to enter Home layout: One level Stairs: Yes: External: 4 steps; bilateral but cannot reach both Has following equipment at home: Dan Humphreys - 2 wheeled, Wheelchair (manual), and shower  chair  PLOF: Independent with household mobility with device, Independent with transfers, Requires assistive device for independence, and Needs assistance with homemaking  PATIENT GOALS: "To walk better and learn to use the prosthetic."  OBJECTIVE:   DIAGNOSTIC FINDINGS: No recent relevant findings.  COGNITION: Overall cognitive status: Within functional limits for tasks assessed   SENSATION: Light touch: WFL  POSTURE: No Significant postural limitations; pt has right mass superior to olecranon (per MD notes related to gout)  LOWER EXTREMITY ROM:  Active ROM Right eval Left eval  Hip flexion WNL WFL  Hip extension    Hip abduction    Hip adduction    Hip internal rotation    Hip external rotation    Knee flexion    Knee extension    Ankle dorsiflexion    Ankle plantarflexion    Ankle inversion    Ankle eversion     (Blank rows = not tested)  LOWER EXTREMITY MMT:  MMT Right eval Left eval  Hip flexion 4+/5 4/5  Hip extension    Hip abduction 4/5 3+/5  Hip adduction 5/5 3/5  Hip internal rotation    Hip external rotation    Knee flexion 5/5   Knee extension 5/5   Ankle dorsiflexion 5/5   Ankle plantarflexion    Ankle inversion    Ankle eversion    (Blank rows = not tested)  BED MOBILITY:  Sit to supine Complete Independence Supine to sit Complete Independence Rolling to Right Complete Independence Rolling to Left Complete Independence  TRANSFERS: Sit to stand: Complete Independence Stand to sit: Complete Independence Floor transfers: daughter states family assisted when pt fell to floor  GAIT: Gait pattern: step to pattern, decreased hip/knee flexion- Left, and trunk flexed Distance walked: 405' + various clinic distances Assistive device utilized:  standard no wheel walker Level of assistance: SBA and CGA Gait velocity: 1.18 ft/sec Comments: Pt traverses too far forward into walker w/ intermittent misplacement of prosthetic causing toe catch x2.   He advances standard walker too far forward during each stride resulting in large step-to stride pattern.  FUNCTIONAL TESTS:  5 times sit to stand: 16.45 seconds w/ BUE support-transfers UE support to RW, needed restart x1 due to instability initially 6 minute walk test: 405' w/ standard walker 10 meter walk test: 28.07 seconds w/ standard walker = 0.36 m/sec OR 1.18 ft/sec Berg Balance Scale: To be assessed.  CURRENT PROSTHETIC WEAR ASSESSMENT: Patient is independent with: residual limb care, care of non-amputated limb, prosthetic cleaning, and ply sock cleaning Patient is dependent with: skin check, correct ply sock adjustment, proper wear schedule/adjustment, and proper weight-bearing schedule/adjustment Donning prosthesis: Complete Independence Doffing prosthesis: Complete Independence Prosthetic wear tolerance: 8 hours inconsistently/day, 7 days/week  Prosthetic weight bearing tolerance: 1 hour Edema: no visible swelling on eval, has stopped wearing shrinker. Residual limb condition: Sweat/moist, clean, well-shaped, mild invagination of scar that is well healed, less hair than proximal leg, warm. Prosthetic description:  ischial containment suction socket w/ flexible inner liner w/ polycentric 4-bar knee w/ pneumatic swing control and fixed ankle SACH  foot K code/activity level with prosthetic use: Level 3   TODAY'S TREATMENT:                                                                                                                                         Time spent having pt doff/don prosthetic for independent practice.  He requires increased time to do this and several attempts to get the limb down deep enough in the socket, requires minA from daughter to remove cap of socket to obtain deep enough position.  Even with this pt remains out of socket approximately 3 inches from bottom when palpated from valve opening.  Discussed angle of prosthetic knee and difficulty sinking far  enough in socket creating some ambulatory deviations and safety concerns and likely hip pain that patient is experiencing.  Gait Gait pattern: step through pattern, wide BOS, and abducted- Left Distance walked: 200 ft + 115 ft Assistive device utilized: Quad cane small base and Walker - 2 wheeled Level of assistance: CGA and Min A Comments:  Pt ambulates w/ RW x 200' at modI level practicing even stride and for assessment of weight shift following static standing assessment of PSIS and iliac crest (appear normal during both standing and dynamic translation).  Pt does have ongoing pistoning and what appears as knee valgus on the left likely related to abducted position of pylon and prosthetic foot.  Transitioned to [facility] w/ minA used due to pistoning and x2 instances of incorrect extension position of prosthetic knee causing mild knee buckle without severe LOB.  Pt demonstrates how he has been attempting to walk at home with his RW positioned on his left using his left hand on the right handle of the walking to push it forward and steady himself.  PT emphasized this is unsafe and to no longer do this in home and to not use SPC he has access to at home either until further gait training can be performed following hopeful adjustment of prosthetic tomorrow as determined by prosthetist.  Time spent providing note for prosthetist appt tomorrow regarding deficits noted and areas to address with interpreter reading to patient and daughter for understanding and agreement.  See patient instructions.  PATIENT EDUCATION: Education details: Continue HEP-will add to existing next session.  Instructions for [facility] and noted deficits during re-donning and ambulation.  Discussed monitoring BP daily and sticking to MD prescribed medicine schedule-reach out to PCP if BP trends high for multiple days in a row. Person educated: Patient and Child(ren) Education method: Explanation, Demonstration, and Handouts Education  comprehension: verbalized understanding and needs further education  HOME EXERCISE PROGRAM: Access Code: [email or web address] URL: [email or web address] Date: 07/15/2022 Prepared by: Peter Congo  Exercises - Prone Hip Flexor Stretch with Towel Roll (AKA)  - 1 x daily - 7 x weekly - 1 sets - 1 reps - 10 minutes hold  ASSESSMENT:  CLINICAL IMPRESSION: Excessive difficulty re-donning  prosthetic today and obtaining adequate depth and seal of socket and liner.  Pt able to don with enough stability to ambulate and trial RW vs NBQC.  He remains safest and most independent with RW at present, but will likely progress to a cane in the near future.  PT to continue trials and gait training as able following adjustment of prosthetic to improve comfort and mechanics.  He tolerates all interventions well and continues to benefit from skilled PT to address ongoing imbalance and prosthetic management needs.  OBJECTIVE IMPAIRMENTS: Abnormal gait, decreased activity tolerance, decreased balance, decreased coordination, decreased endurance, decreased knowledge of use of DME, decreased mobility, difficulty walking, decreased ROM, decreased strength, decreased safety awareness, increased edema, improper body mechanics, and prosthetic dependency .   ACTIVITY LIMITATIONS: carrying, lifting, bending, standing, squatting, stairs, transfers, locomotion level, and caring for others  PARTICIPATION LIMITATIONS: meal prep, cleaning, laundry, driving, shopping, community activity, and yard work  PERSONAL FACTORS: Age, Education, Fitness, Past/current experiences, Transportation, and 3+ comorbidities: HTN, CKD, DM2, and prostate cancer w/ ongoing radiation  are also affecting patient's functional outcome.   REHAB POTENTIAL: Good  CLINICAL DECISION MAKING: Evolving/moderate complexity  EVALUATION COMPLEXITY: Moderate   GOALS: Goals reviewed with patient? Yes  SHORT TERM GOALS: Target date: 08/09/2022  Pt will  be independent and compliant with initial strength and balance HEP to improve functional mobility and prosthetic management. Baseline:  To be established. Goal status: INITIAL  2.  Pt will decrease 5xSTS to </=13 seconds w/o UE support in order to demonstrate decreased risk for falls and improved functional bilateral LE strength and power. Baseline: 16.45 sec w/ BUE support Goal status: INITIAL  3.  Pt will ambulate >/=605 feet on to demonstrate improved functional endurance for home and community participation. Baseline:  405' continuously w/ standard walker Goal status: INITIAL  4.  Pt will demonstrate a gait speed of >/=1.48 feet/sec in order to decrease risk for falls. Baseline: 1.18 ft/sec w/ standard walker Goal status: INITIAL  5.  Pt will improve Berg score to 29/56 for decreased fall risk Baseline:  24/56 (5/6) Goal status: INITIAL  LONG TERM GOALS: Target date: 09/06/2022  Patient to maintain established walking program x3 days per week to improve cardiovascular tolerance and functional mobility w/ prosthesis and LRAD as needed. Baseline:  To be established. Goal status: INITIAL  2.  Pt will ambulate >/=805 feet on to demonstrate improved functional endurance for home and community participation. Baseline: 405' continuously w/ standard walker Goal status: INITIAL  3.  Pt will demonstrate a gait speed of >/=1.78 feet/sec in order to decrease risk for falls. Baseline: 1.18 ft/sec w/ standard walker Goal status: INITIAL  4.  Pt will improve Berg score to 34/56 for decreased fall risk Baseline:  24/56 (5/6) Goal status: INITIAL  5.  Pt will ambulate >/=250 feet over level surfaces using quad cane for improved access to home environment. Baseline: To be initiated. Goal status: INITIAL  6.  Pt will navigate 4 stairs SBA using single rail and cane as necessary to improve entry to home environment. Baseline:  To be assessed. Goal status: INITIAL   PLAN:  PT  FREQUENCY: 2x/week  PT DURATION: 8 weeks  PLANNED INTERVENTIONS: Therapeutic exercises, Therapeutic activity, Neuromuscular re-education, Balance training, Gait training, Patient/Family education, Self Care, Joint mobilization, Stair training, Vestibular training, Prosthetic training, DME instructions, Manual therapy, and Re-evaluation  PLAN FOR NEXT SESSION: Assess left BP-did he take meds?  What adjustments did [facility] make to  prosthetic socket/pylon?  Sink HEP and other functional hip strengthening, walking program.  Gait training w/ walker safety-did they bring RW?  Can try clinic RW for use at home.  Working towards quad cane for level ground and stairs.  Sadie Haber, PT, DPT 07/22/2022, 2:50 PM

## 2022-07-22 NOTE — Patient Instructions (Signed)
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  He feels it is too short, but in static standing his PSIS and iliac crest appeared in alignment bilaterally.  My concern is he may not be getting deep enough into his socket despite using all measures to achieve good suction.  He remains about 2-3 inches from the bottom of the socket w/ audible pistoning during stepping especially w/ use of narrow base quad cane vs RW likely due to more pressure through the legs.  He had some difficulty getting a good seal during our session on 07/22/2022.  It may be of benefit to address this if able/appropriate and he may also benefit from an antiperspirant due to excessive sweating under his liner with walking.  Thank you,  Camille Bal, PT, DPT

## 2022-07-23 DIAGNOSIS — Z191 Hormone sensitive malignancy status: Secondary | ICD-10-CM | POA: Diagnosis not present

## 2022-07-23 DIAGNOSIS — Z510 Encounter for antineoplastic radiation therapy: Secondary | ICD-10-CM | POA: Diagnosis not present

## 2022-07-24 DIAGNOSIS — Z191 Hormone sensitive malignancy status: Secondary | ICD-10-CM | POA: Diagnosis not present

## 2022-07-24 DIAGNOSIS — Z510 Encounter for antineoplastic radiation therapy: Secondary | ICD-10-CM | POA: Diagnosis not present

## 2022-07-25 ENCOUNTER — Other Ambulatory Visit: Payer: Self-pay | Admitting: Family

## 2022-07-25 ENCOUNTER — Encounter: Payer: Self-pay | Admitting: Physical Therapy

## 2022-07-25 ENCOUNTER — Ambulatory Visit: Payer: 59 | Admitting: Physical Therapy

## 2022-07-25 VITALS — BP 178/79 | HR 67

## 2022-07-25 DIAGNOSIS — R2689 Other abnormalities of gait and mobility: Secondary | ICD-10-CM | POA: Diagnosis not present

## 2022-07-25 DIAGNOSIS — Z510 Encounter for antineoplastic radiation therapy: Secondary | ICD-10-CM | POA: Diagnosis not present

## 2022-07-25 DIAGNOSIS — M6281 Muscle weakness (generalized): Secondary | ICD-10-CM | POA: Diagnosis not present

## 2022-07-25 DIAGNOSIS — R2681 Unsteadiness on feet: Secondary | ICD-10-CM | POA: Diagnosis not present

## 2022-07-25 DIAGNOSIS — Z89612 Acquired absence of left leg above knee: Secondary | ICD-10-CM | POA: Diagnosis not present

## 2022-07-25 DIAGNOSIS — Z191 Hormone sensitive malignancy status: Secondary | ICD-10-CM | POA: Diagnosis not present

## 2022-07-25 NOTE — Therapy (Signed)
OUTPATIENT PHYSICAL THERAPY PROSTHETICS TREATMENT   Patient Name: Tim Vincent MRN: [id number] DOB:May 21, 1955, 67 y.o., male Today's Date: 07/25/2022  PCP: Filomena Jungling, NP REFERRING PROVIDER: Nadara Mustard, MD  END OF SESSION:  PT End of Session - 07/25/22 1630     Visit Number 4    Number of Visits 17   16 + eval   Date for PT Re-Evaluation 09/13/22   pushed out due to frequency and scheduling conflicts w/ radiation therapy   Authorization Type [employer]ICARE    Progress Note Due on Visit 10    PT Start Time 1628   pt arrives late   PT Stop Time 1658    PT Time Calculation (min) 30 min    Equipment Utilized During Treatment Gait belt    Activity Tolerance Patient tolerated treatment well;Treatment limited secondary to medical complications (Comment)   elevated systolic BP   Behavior During Therapy [facility] for tasks assessed/performed              Past Medical History:  Diagnosis Date   Diabetes mellitus without complication (HCC)    Hypertension    Past Surgical History:  Procedure Laterality Date   AMPUTATION Left 04/17/2022   Procedure: LEFT ABOVE KNEE AMPUTATION;  Surgeon: Nadara Mustard, MD;  Location: [facility];  Service: Orthopedics;  Laterality: Left;   MASS EXCISION Left 04/17/2021   Procedure: EXCISION LEFT ELBOW MASS;  Surgeon: Allena Napoleon, MD;  Location: [facility];  Service: Plastics;  Laterality: Left;  1 hour   MASS EXCISION Left 08/10/2021   Procedure: EXCISION ELBOW MASS;  Surgeon: Allena Napoleon, MD;  Location: [facility];  Service: Plastics;  Laterality: Left;  1 hour   Patient Active Problem List   Diagnosis Date Noted   Diabetic infection of left foot (HCC) 04/17/2022   Necrotizing fasciitis of lower leg (HCC) 04/17/2022   Dyslipidemia 04/17/2022   Essential hypertension 01/12/2021   Obesity 01/12/2021   Skin sensation disturbance 01/12/2021   Type 2 diabetes mellitus without complication (HCC)  01/12/2021   Diabetic neuropathy (HCC) 01/12/2021   Hav (hallux abducto valgus), unspecified laterality 01/12/2021   Cough 10/31/2017   Elevated blood pressure reading 10/31/2017    ONSET DATE: 04/17/2022 (date of amputation)  REFERRING DIAG: W09.811 (ICD-10-CM) - S/P above knee amputation, left (HCC)  THERAPY DIAG:  Other abnormalities of gait and mobility  Muscle weakness (generalized)  Unsteadiness on feet  Rationale for Evaluation and Treatment: Rehabilitation  SUBJECTIVE:   SUBJECTIVE STATEMENT: Daughter reports that [employer] lengthened the pylon and trimmed the medial aspect of the socket.  He denies pain or falls today.  Pt reports he has been wearing his prosthetic for up to 8 hours per day (in blocks of time), but only about an hour today.  That hour he spent walking.  Pt reports he is inspecting his limb and has not noticed any pain, redness, or other skin breakdown.   Pt accompanied by: family member-daughter [name]; Spanish-language interpreter [name]  PERTINENT HISTORY: HTN, DM2, CKD, prostate cancer undergoing radiation treatment and androgen deprivation therapy  PAIN:  Are you having pain? No  PRECAUTIONS: Fall; pt is having radiation therapy M-F in the mornings  WEIGHT BEARING RESTRICTIONS: No  FALLS: Has patient fallen in last 6 months? Yes. Number of falls 2-his did not have prosthetic donned, thought his foot was there and tried to step and went down  LIVING ENVIRONMENT: Lives with: lives with their family  and lives with their spouse-2 adult children, 1 son-in-law, 3 infant children Lives in: House/apartment Home Access: Stairs to enter Home layout: One level Stairs: Yes: External: 4 steps; bilateral but cannot reach both Has following equipment at home: Dan Humphreys - 2 wheeled, Wheelchair (manual), and shower chair  PLOF: Independent with household mobility with device, Independent with transfers, Requires assistive device for independence, and Needs  assistance with homemaking  PATIENT GOALS: "To walk better and learn to use the prosthetic."  OBJECTIVE:   DIAGNOSTIC FINDINGS: No recent relevant findings.  COGNITION: Overall cognitive status: Within functional limits for tasks assessed   SENSATION: Light touch: WFL  POSTURE: No Significant postural limitations; pt has right mass superior to olecranon (per MD notes related to gout)  LOWER EXTREMITY ROM:  Active ROM Right eval Left eval  Hip flexion WNL WFL  Hip extension    Hip abduction    Hip adduction    Hip internal rotation    Hip external rotation    Knee flexion    Knee extension    Ankle dorsiflexion    Ankle plantarflexion    Ankle inversion    Ankle eversion     (Blank rows = not tested)  LOWER EXTREMITY MMT:  MMT Right eval Left eval  Hip flexion 4+/5 4/5  Hip extension    Hip abduction 4/5 3+/5  Hip adduction 5/5 3/5  Hip internal rotation    Hip external rotation    Knee flexion 5/5   Knee extension 5/5   Ankle dorsiflexion 5/5   Ankle plantarflexion    Ankle inversion    Ankle eversion    (Blank rows = not tested)  BED MOBILITY:  Sit to supine Complete Independence Supine to sit Complete Independence Rolling to Right Complete Independence Rolling to Left Complete Independence  TRANSFERS: Sit to stand: Complete Independence Stand to sit: Complete Independence Floor transfers: daughter states family assisted when pt fell to floor  GAIT: Gait pattern: step to pattern, decreased hip/knee flexion- Left, and trunk flexed Distance walked: 405' + various clinic distances Assistive device utilized:  standard no wheel walker Level of assistance: SBA and CGA Gait velocity: 1.18 ft/sec Comments: Pt traverses too far forward into walker w/ intermittent misplacement of prosthetic causing toe catch x2.  He advances standard walker too far forward during each stride resulting in large step-to stride pattern.  FUNCTIONAL TESTS:  5 times sit to  stand: 16.45 seconds w/ BUE support-transfers UE support to RW, needed restart x1 due to instability initially 6 minute walk test: 405' w/ standard walker 10 meter walk test: 28.07 seconds w/ standard walker = 0.36 m/sec OR 1.18 ft/sec Berg Balance Scale: To be assessed.  CURRENT PROSTHETIC WEAR ASSESSMENT: Patient is independent with: residual limb care, care of non-amputated limb, prosthetic cleaning, and ply sock cleaning Patient is dependent with: skin check, correct ply sock adjustment, proper wear schedule/adjustment, and proper weight-bearing schedule/adjustment Donning prosthesis: Complete Independence Doffing prosthesis: Complete Independence Prosthetic wear tolerance: 8 hours inconsistently/day, 7 days/week  Prosthetic weight bearing tolerance: 1 hour Edema: no visible swelling on eval, has stopped wearing shrinker. Residual limb condition: Sweat/moist, clean, well-shaped, mild invagination of scar that is well healed, less hair than proximal leg, warm. Prosthetic description:  ischial containment suction socket w/ flexible inner liner w/ polycentric 4-bar knee w/ pneumatic swing control and fixed ankle SACH foot K code/activity level with prosthetic use: Level 3   TODAY'S TREATMENT:  REVIEWED ENTIRETY OF SINK HEP.  Do each exercise 1-2  times per day Do each exercise 10 repetitions Hold each exercise for 2 seconds to feel your location  AT SINK FIND YOUR MIDLINE POSITION AND PLACE FEET EQUAL DISTANCE FROM THE MIDLINE.  Try to find this position when standing still for activities.   USE TAPE ON FLOOR TO MARK THE MIDLINE POSITION. You also should try to feel with your limb pressure in socket.  You are trying to feel with limb what you used to feel with the bottom of your foot.  Side to Side Shift: Moving your hips only (not shoulders): move  weight onto your left leg, HOLD/FEEL.  Move back to equal weight on each leg, HOLD/FEEL. Move weight onto your right leg, HOLD/FEEL. Move back to equal weight on each leg, HOLD/FEEL. Repeat.  Start with both hands on sink, progress to right hand only, then no hands.  Front to Back Shift: Moving your hips only (not shoulders): move your weight forward onto your toes, HOLD/FEEL. Move your weight back to equal Flat Foot on both legs, HOLD/FEEL. Move your weight back onto your heels, HOLD/FEEL. Move your weight back to equal on both legs, HOLD/FEEL. Repeat.   tart with both hands on sink, progress to right hand only, then no hands. Moving Cones / Cups: With equal weight on each leg: Hold on with one hand the first time, then progress to no hand supports. Move cups from one side of sink to the other. Place cups ~2" out of your reach, progress to 10" beyond reach.  Place one hand in middle of sink and reach with other hand. Do both arms.  Then hover one hand and move cups with other hand.  Overhead/Upward Reaching: alternated reaching up to top cabinets or ceiling if no cabinets present. Keep equal weight on each leg. Start with one hand support on counter while other hand reaches and progress to no hand support with reaching.  ace one hand in middle of sink and reach with other hand. Do both arms.  Then hover one hand and move cups with other hand.  5.   Looking Over Shoulders: With equal weight on each leg: alternate turning to look over your shoulders with one hand support on counter as needed.  Start with head motions only to look in front of shoulder, then even with shoulder and progress to looking behind you. To look to side, move head /eyes, then shoulder on side looking pulls back, shift more weight to side looking and pull hip back. ace one hand in middle of sink and let go with other hand so your shoulder can pull back. Switch hands to look other way.   Then hover one hand and move cups with other hand.  6.   Stepping with leg that is not amputated:  Move items under cabinet out of your way. Shift your hips/pelvis so weight on prosthesis. SLOWLY step other leg so front of foot is in cabinet. Then step back to floor.    PATIENT EDUCATION: Education details: Sink HEP.  Re-iterated monitoring BP daily and sticking to MD prescribed medicine schedule-reach out to PCP if BP trends high for multiple days in a row.  Do not buy cane until further gait training completed and decision is made on best type for pt needs. Person educated: Patient and Child(ren) Education method: Explanation, Demonstration, and Handouts Education comprehension: verbalized understanding and needs further education  HOME EXERCISE PROGRAM: Access Code: [email or web address] URL: [email or web address]  Date: 07/15/2022 Prepared by: Peter Congo  Exercises - Prone Hip Flexor Stretch with Towel Roll (AKA)  - 1 x daily - 7 x weekly - 1 sets - 1 reps - 10 minutes hold  AMPUTEE SINK HEP (Provided to pt and daughter in [place] and spanish 5/16) Haz cada ejercicio 1-2 veces al da. Haz cada ejercicio 10 repeticiones. Mantenga cada ejercicio durante 2 segundos para sentir su ubicacin.  EN EL FREGADERO ENCUENTRE SU POSICIN EN LA LNEA MEDIA Y COLOQUE LOS PIES A LA IGUAL DISTANCIA DE LA LNEA MEDIA. Trate de encontrar esta posicin cuando est quieto para Arts development officer.  UTILICE CINTA EN EL PISO PARA MARCAR LA POSICIN DE LA LNEA MEDIA. Tambin debe intentar sentir con la extremidad la presin en la cavidad. Ests tratando de sentir con las extremidades lo que solas sentir con la planta del pie.  1. Desplazamiento de lado a lado: moviendo solo las caderas (no los hombros): [employer] el peso hacia la pierna izquierda, MANTENER/SENTIR. Vuelva a colocar el mismo peso en cada pierna, MANTENER/SENTIR. Mueva el peso sobre su pierna derecha, MANTENER/SENTIR. Vuelva a colocar el mismo peso en cada pierna, MANTENER/SENTIR. Repetir.  Comience con ambas manos en el lavabo, avance solo hacia la derecha y luego sin manos. 2. Cambio de adelante hacia atrs: moviendo solo las caderas (no los hombros): [employer] el peso hacia adelante [employer] dedos de los pies, MANTENER/SENTIR. Mueva su peso hacia atrs para igualar el pie plano en ambas piernas, MANTENER/SENTIR. Mueva su peso nuevamente sobre sus talones, MANTENER/SENTIR. Mueva su peso hacia atrs para igualarlo en ambas piernas, MANTENER/SENTIR. Repetir. comience con ambas manos en el fregadero, avance solo [employer] mano derecha, luego sin [place]. 3. Conos/copas en movimiento: Con el mismo peso en cada pierna: Sujtese con una mano la primera vez, luego avance hasta no apoyar las manos. Mueva las tazas de un lado del fregadero al otro. Coloque los vasos a ~2" fuera de su alcance, avance hasta 10" ms all de su alcance. Coloque una mano en el medio del fregadero y extienda la mano con la otra. Haz ambos brazos. Luego coloque una mano y [place] las tazas con la otra. 4. Alcanzar hacia arriba/hacia Tomasita Crumble: se alterna el alcance hasta los gabinetes superiores o el techo si no hay gabinetes presentes. Mantenga el mismo peso en cada pierna. Comience con Edison Simon apoyada en el mostrador mientras la otra mano se extiende y avance hasta no apoyar la mano al [place]. Coloque una mano en el medio del fregadero y extindala con la [place]. Haz ambos brazos. Luego coloque una mano y [place] las tazas con la otra. 5. Mirando por encima de los hombros: Con el mismo peso en cada pierna: gire alternativamente para mirar por encima de los hombros con una mano apoyada en el mostrador segn sea necesario. Comience con movimientos de la [place] solo para mirar delante del hombro, luego incluso con el hombro y Programme researcher, broadcasting/film/video mirar detrs de usted. Para mirar hacia un lado, mueva la cabeza/los ojos, luego el hombro del costado que mira [employer], cambie ms peso hacia el costado y tire de la cadera Risk manager. Coloca una  mano en el medio del fregadero y suelta la otra para que tu hombro pueda retroceder. Cambia de mano para mirar hacia otro lado. Luego coloque una mano y [place] las tazas con la otra. 6. Al pisar con una pierna que no est amputada: retire de su camino los artculos que se encuentran debajo del gabinete. Mueva  las caderas/pelvis para que el peso recaiga sobre la prtesis. PASE L[place]ENT[place] la otra pierna de modo que la parte delantera del pie quede dentro del gabinete. Luego retroceda al piso.  ASSESSMENT:  CLINICAL IMPRESSION: Pt has had recent [employer] adjustment to prosthetic which may allow for improved and safer trials with cane options in clinic next visit.  Completed sink HEP this visit to progress patient tolerance to weight bearing and stability standing in prosthetic.  He continues to benefit from skilled HEP to address ongoing functional strength, prosthetic tolerance and management, and progression of ambulation with LRAD as able.  Will continue per POC.  OBJECTIVE IMPAIRMENTS: Abnormal gait, decreased activity tolerance, decreased balance, decreased coordination, decreased endurance, decreased knowledge of use of DME, decreased mobility, difficulty walking, decreased ROM, decreased strength, decreased safety awareness, increased edema, improper body mechanics, and prosthetic dependency .   ACTIVITY LIMITATIONS: carrying, lifting, bending, standing, squatting, stairs, transfers, locomotion level, and caring for others  PARTICIPATION LIMITATIONS: meal prep, cleaning, laundry, driving, shopping, community activity, and yard work  PERSONAL FACTORS: Age, Education, Fitness, Past/current experiences, Transportation, and 3+ comorbidities: HTN, CKD, DM2, and prostate cancer w/ ongoing radiation  are also affecting patient's functional outcome.   REHAB POTENTIAL: Good  CLINICAL DECISION MAKING: Evolving/moderate complexity  EVALUATION COMPLEXITY: Moderate   GOALS: Goals reviewed with patient?  Yes  SHORT TERM GOALS: Target date: 08/09/2022  Pt will be independent and compliant with initial strength and balance HEP to improve functional mobility and prosthetic management. Baseline:  To be established. Goal status: INITIAL  2.  Pt will decrease 5xSTS to </=13 seconds w/o UE support in order to demonstrate decreased risk for falls and improved functional bilateral LE strength and power. Baseline: 16.45 sec w/ BUE support Goal status: INITIAL  3.  Pt will ambulate >/=605 feet on to demonstrate improved functional endurance for home and community participation. Baseline:  405' continuously w/ standard walker Goal status: INITIAL  4.  Pt will demonstrate a gait speed of >/=1.48 feet/sec in order to decrease risk for falls. Baseline: 1.18 ft/sec w/ standard walker Goal status: INITIAL  5.  Pt will improve Berg score to 29/56 for decreased fall risk Baseline:  24/56 (5/6) Goal status: INITIAL  LONG TERM GOALS: Target date: 09/06/2022  Patient to maintain established walking program x3 days per week to improve cardiovascular tolerance and functional mobility w/ prosthesis and LRAD as needed. Baseline:  To be established. Goal status: INITIAL  2.  Pt will ambulate >/=805 feet on to demonstrate improved functional endurance for home and community participation. Baseline: 405' continuously w/ standard walker Goal status: INITIAL  3.  Pt will demonstrate a gait speed of >/=1.78 feet/sec in order to decrease risk for falls. Baseline: 1.18 ft/sec w/ standard walker Goal status: INITIAL  4.  Pt will improve Berg score to 34/56 for decreased fall risk Baseline:  24/56 (5/6) Goal status: INITIAL  5.  Pt will ambulate >/=250 feet over level surfaces using quad cane for improved access to home environment. Baseline: To be initiated. Goal status: INITIAL  6.  Pt will navigate 4 stairs SBA using single rail and cane as necessary to improve entry to home  environment. Baseline:  To be assessed. Goal status: INITIAL   PLAN:  PT FREQUENCY: 2x/week  PT DURATION: 8 weeks  PLANNED INTERVENTIONS: Therapeutic exercises, Therapeutic activity, Neuromuscular re-education, Balance training, Gait training, Patient/Family education, Self Care, Joint mobilization, Stair training, Vestibular training, Prosthetic training, DME instructions, Manual therapy, and Re-evaluation  PLAN FOR NEXT SESSION: Assess left BP-did he take meds?  How is sink HEP?  functional hip strengthening, walking program-translate to [place].  Gait training w/ walker vs 4-prong cane.  Working towards quad cane for level ground and stairs.  Sadie Haber, PT, DPT 07/25/2022, 4:59 PM

## 2022-07-26 DIAGNOSIS — Z191 Hormone sensitive malignancy status: Secondary | ICD-10-CM | POA: Diagnosis not present

## 2022-07-26 DIAGNOSIS — Z510 Encounter for antineoplastic radiation therapy: Secondary | ICD-10-CM | POA: Diagnosis not present

## 2022-07-29 ENCOUNTER — Ambulatory Visit: Payer: 59 | Admitting: Physical Therapy

## 2022-07-29 VITALS — BP 164/78 | HR 68

## 2022-07-29 DIAGNOSIS — Z510 Encounter for antineoplastic radiation therapy: Secondary | ICD-10-CM | POA: Diagnosis not present

## 2022-07-29 DIAGNOSIS — Z89612 Acquired absence of left leg above knee: Secondary | ICD-10-CM | POA: Diagnosis not present

## 2022-07-29 DIAGNOSIS — R2689 Other abnormalities of gait and mobility: Secondary | ICD-10-CM | POA: Diagnosis not present

## 2022-07-29 DIAGNOSIS — Z191 Hormone sensitive malignancy status: Secondary | ICD-10-CM | POA: Diagnosis not present

## 2022-07-29 DIAGNOSIS — R2681 Unsteadiness on feet: Secondary | ICD-10-CM

## 2022-07-29 DIAGNOSIS — M6281 Muscle weakness (generalized): Secondary | ICD-10-CM | POA: Diagnosis not present

## 2022-07-29 NOTE — Therapy (Signed)
OUTPATIENT PHYSICAL THERAPY PROSTHETICS TREATMENT   Patient Name: Tim Vincent MRN: [id number] DOB:1955-12-10, 67 y.o., male Today's Date: 07/29/2022  PCP: Tim Jungling, NP REFERRING PROVIDER: Nadara Mustard, MD  END OF SESSION:  PT End of Session - 07/29/22 1407     Visit Number 5    Number of Visits 17   16 + eval   Date for PT Re-Evaluation 09/13/22   pushed out due to frequency and scheduling conflicts w/ radiation therapy   Authorization Type [employer]    Progress Note Due on Visit 10    PT Start Time 1404    PT Stop Time 1442    PT Time Calculation (min) 38 min    Equipment Utilized During Treatment Gait belt    Activity Tolerance Patient tolerated treatment well;Treatment limited secondary to medical complications (Comment)   elevated systolic BP   Behavior During Therapy WFL for tasks assessed/performed              Past Medical History:  Diagnosis Date   Diabetes mellitus without complication (HCC)    Hypertension    Past Surgical History:  Procedure Laterality Date   AMPUTATION Left 04/17/2022   Procedure: LEFT ABOVE KNEE AMPUTATION;  Surgeon: Tim Mustard, MD;  Location: [facility];  Service: Orthopedics;  Laterality: Left;   MASS EXCISION Left 04/17/2021   Procedure: EXCISION LEFT ELBOW MASS;  Surgeon: Tim Napoleon, MD;  Location: [facility];  Service: Plastics;  Laterality: Left;  1 hour   MASS EXCISION Left 08/10/2021   Procedure: EXCISION ELBOW MASS;  Surgeon: Tim Napoleon, MD;  Location: [facility];  Service: Plastics;  Laterality: Left;  1 hour   Patient Active Problem List   Diagnosis Date Noted   Diabetic infection of left foot (HCC) 04/17/2022   Necrotizing fasciitis of lower leg (HCC) 04/17/2022   Dyslipidemia 04/17/2022   Essential hypertension 01/12/2021   Obesity 01/12/2021   Skin sensation disturbance 01/12/2021   Type 2 diabetes mellitus without complication (HCC) 01/12/2021   Diabetic  neuropathy (HCC) 01/12/2021   Hav (hallux abducto valgus), unspecified laterality 01/12/2021   Cough 10/31/2017   Elevated blood pressure reading 10/31/2017    ONSET DATE: 04/17/2022 (date of amputation)  REFERRING DIAG: W09.811 (ICD-10-CM) - S/P above knee amputation, left (HCC)  THERAPY DIAG:  Other abnormalities of gait and mobility  Muscle weakness (generalized)  Unsteadiness on feet  Rationale for Evaluation and Treatment: Rehabilitation  SUBJECTIVE:   SUBJECTIVE STATEMENT: Pt reports no pain today, no acute changes since last visit and no falls. Pt reports his sink HEP is going well, no questions. Pt reports that his prosthetic feels like it may be a little bit long; L hip was hurting when proshtetic was too short, now R hip is hurting so he think that maybe his prosthetic is too long. Pt's daughter reports that on Friday his prosthetic liner was rolling down his thigh, they put in his new one and that seems to be doing better with no issues.  Pt accompanied by: family member-daughter Tim Vincent; Spanish-language interpreter Tim Vincent  PERTINENT HISTORY: HTN, DM2, CKD, prostate cancer undergoing radiation treatment and androgen deprivation therapy  PAIN:  Are you having pain? No  PRECAUTIONS: Fall; pt is having radiation therapy M-F in the mornings  WEIGHT BEARING RESTRICTIONS: No  FALLS: Has patient fallen in last 6 months? Yes. Number of falls 2-his did not have prosthetic donned, thought his foot was there and tried  to step and went down  LIVING ENVIRONMENT: Lives with: lives with their family and lives with their spouse-2 adult children, 1 son-in-law, 3 infant children Lives in: House/apartment Home Access: Stairs to enter Home layout: One level Stairs: Yes: External: 4 steps; bilateral but cannot reach both Has following equipment at home: Dan Humphreys - 2 wheeled, Wheelchair (manual), and shower chair  PLOF: Independent with household mobility with device, Independent with  transfers, Requires assistive device for independence, and Needs assistance with homemaking  PATIENT GOALS: "To walk better and learn to use the prosthetic."  OBJECTIVE:   DIAGNOSTIC FINDINGS: No recent relevant findings.  COGNITION: Overall cognitive status: Within functional limits for tasks assessed   SENSATION: Light touch: WFL  POSTURE: No Significant postural limitations; pt has right mass superior to olecranon (per MD notes related to gout)  LOWER EXTREMITY ROM:  Active ROM Right eval Left eval  Hip flexion WNL WFL  Hip extension    Hip abduction    Hip adduction    Hip internal rotation    Hip external rotation    Knee flexion    Knee extension    Ankle dorsiflexion    Ankle plantarflexion    Ankle inversion    Ankle eversion     (Blank rows = not tested)  LOWER EXTREMITY MMT:  MMT Right eval Left eval  Hip flexion 4+/5 4/5  Hip extension    Hip abduction 4/5 3+/5  Hip adduction 5/5 3/5  Hip internal rotation    Hip external rotation    Knee flexion 5/5   Knee extension 5/5   Ankle dorsiflexion 5/5   Ankle plantarflexion    Ankle inversion    Ankle eversion    (Blank rows = not tested)  BED MOBILITY:  Sit to supine Complete Independence Supine to sit Complete Independence Rolling to Right Complete Independence Rolling to Left Complete Independence  TRANSFERS: Sit to stand: Complete Independence Stand to sit: Complete Independence Floor transfers: daughter states family assisted when pt fell to floor  GAIT: Gait pattern: step to pattern, decreased hip/knee flexion- Left, and trunk flexed Distance walked: 405' + various clinic distances Assistive device utilized:  standard no wheel walker Level of assistance: SBA and CGA Gait velocity: 1.18 ft/sec Comments: Pt traverses too far forward into walker w/ intermittent misplacement of prosthetic causing toe catch x2.  He advances standard walker too far forward during each stride resulting in  large step-to stride pattern.  FUNCTIONAL TESTS:  5 times sit to stand: 16.45 seconds w/ BUE support-transfers UE support to RW, needed restart x1 due to instability initially 6 minute walk test: 405' w/ standard walker 10 meter walk test: 28.07 seconds w/ standard walker = 0.36 m/sec OR 1.18 ft/sec Berg Balance Scale: To be assessed.  CURRENT PROSTHETIC WEAR ASSESSMENT: Patient is independent with: residual limb care, care of non-amputated limb, prosthetic cleaning, and ply sock cleaning Patient is dependent with: skin check, correct ply sock adjustment, proper wear schedule/adjustment, and proper weight-bearing schedule/adjustment Donning prosthesis: Complete Independence Doffing prosthesis: Complete Independence Prosthetic wear tolerance: 8 hours inconsistently/day, 7 days/week  Prosthetic weight bearing tolerance: 1 hour Edema: no visible swelling on eval, has stopped wearing shrinker. Residual limb condition: Sweat/moist, clean, well-shaped, mild invagination of scar that is well healed, less hair than proximal leg, warm. Prosthetic description:  ischial containment suction socket w/ flexible inner liner w/ polycentric 4-bar knee w/ pneumatic swing control and fixed ankle SACH foot K code/activity level with prosthetic use: Level 3  TODAY'S TREATMENT:                                                                                                                                          Assessed in sitting at beginning of session: Vitals:   07/29/22 1412  BP: (!) 164/78  Pulse: 68    Gait: Gait pattern: circumduction- Left Distance walked: 115 ft Assistive device utilized: Walker - 2 wheeled Level of assistance: Modified independence Comments: it appears that LLE prosthetic may be slightly too long  Gait pattern: circumduction- Left and lateral lean- Left Distance walked: 115 ft Assistive device utilized: Quad cane small base Level of assistance: CGA Comments: trial gait  with QC; increase in L lateral lean due to leg length discrepancy  Gait pattern: circumduction- Left and lateral lean- Left Distance walked: 115 ft Assistive device utilized: Single point cane Level of assistance: CGA and Min A Comments: trial gait with SPC; pt exhibits decreased gait speed, increased energy expenditure, and decreased balance with use of SPC vs SBQC   Gait outdoors with SBQC with min A overall up/down ramps, up/down curbs, 3 instances of LLE catching leading to knee buckling and needing up to mod A to recover.  Gait across uneven blue mat with SBQC and min A, 6 x 10 ft with some ongoing circumduction of LLE noted.  Encouraged pt to reach out to [facility] to schedule an appointment as his LLE prosthetic appears to be longer than his RLE leading to above-mentioned gait impairments.    AT [facility] FIND YOUR MIDLINE POSITION AND PLACE FEET EQUAL DISTANCE FROM THE MIDLINE.  Try to find this position when standing still for activities.   USE TAPE ON FLOOR TO MARK THE MIDLINE POSITION. You also should try to feel with your limb pressure in socket.  You are trying to feel with limb what you used to feel with the bottom of your foot.  Side to Side Shift: Moving your hips only (not shoulders): move weight onto your left leg, HOLD/FEEL.  Move back to equal weight on each leg, HOLD/FEEL. Move weight onto your right leg, HOLD/FEEL. Move back to equal weight on each leg, HOLD/FEEL. Repeat.  Start with both hands on sink, progress to right hand only, then no hands.  Front to Back Shift: Moving your hips only (not shoulders): move your weight forward onto your toes, HOLD/FEEL. Move your weight back to equal Flat Foot on both legs, HOLD/FEEL. Move your weight back onto your heels, HOLD/FEEL. Move your weight back to equal on both legs, HOLD/FEEL. Repeat.   tart with both hands on sink, progress to right hand only, then no hands. Moving Cones / Cups: With equal weight on each leg: Hold on with one  hand the first time, then progress to no hand supports. Move cups from one side of sink to the other. Place cups ~2" out of your reach,  progress to 10" beyond reach.  Place one hand in middle of sink and reach with other hand. Do both arms.  Then hover one hand and move cups with other hand.  Overhead/Upward Reaching: alternated reaching up to top cabinets or ceiling if no cabinets present. Keep equal weight on each leg. Start with one hand support on counter while other hand reaches and progress to no hand support with reaching.  ace one hand in middle of sink and reach with other hand. Do both arms.  Then hover one hand and move cups with other hand.  5.   Looking Over Shoulders: With equal weight on each leg: alternate turning to look over your shoulders with one hand support on counter as needed.  Start with head motions only to look in front of shoulder, then even with shoulder and progress to looking behind you. To look to side, move head /eyes, then shoulder on side looking pulls back, shift more weight to side looking and pull hip back. ace one hand in middle of sink and let go with other hand so your shoulder can pull back. Switch hands to look other way.   Then hover one hand and move cups with other hand.  6.  Stepping with leg that is not amputated:  Move items under cabinet out of your way. Shift your hips/pelvis so weight on prosthesis. SLOWLY step other leg so front of foot is in cabinet. Then step back to floor.    PATIENT EDUCATION: Education details: continue HEP and BP monitoring at home; reach out to [facility] about prosthetic adjustment; Do not buy cane until further gait training completed and decision is made on best type for pt needs. Person educated: Patient and Child(ren) Education method: Medical illustrator Education comprehension: verbalized understanding and needs further education  HOME EXERCISE PROGRAM: Access Code: [email or web address] URL:  [email or web address] Date: 07/15/2022 Prepared by: Peter Congo  Exercises - Prone Hip Flexor Stretch with Towel Roll (AKA)  - 1 x daily - 7 x weekly - 1 sets - 1 reps - 10 minutes hold  AMPUTEE SINK HEP (Provided to pt and daughter in [place] and spanish 5/16) Haz cada ejercicio 1-2 veces al da. Haz cada ejercicio 10 repeticiones. Mantenga cada ejercicio durante 2 segundos para sentir su ubicacin.  EN EL FREGADERO ENCUENTRE SU POSICIN EN LA LNEA MEDIA Y COLOQUE LOS PIES A LA IGUAL DISTANCIA DE LA LNEA MEDIA. Trate de encontrar esta posicin cuando est quieto para Arts development officer.  UTILICE CINTA EN EL PISO PARA MARCAR LA POSICIN DE LA LNEA MEDIA. Tambin debe intentar sentir con la extremidad la presin en la cavidad. Ests tratando de sentir con las extremidades lo que solas sentir con la planta del pie.  1. Desplazamiento de lado a lado: moviendo solo las caderas (no los hombros): [employer] el peso hacia la pierna izquierda, MANTENER/SENTIR. Vuelva a colocar el mismo peso en cada pierna, MANTENER/SENTIR. Mueva el peso sobre su pierna derecha, MANTENER/SENTIR. Vuelva a colocar el mismo peso en cada pierna, MANTENER/SENTIR. Repetir. Comience con ambas manos en el lavabo, avance solo hacia la derecha y luego sin manos. 2. Cambio de adelante hacia atrs: moviendo solo las caderas (no los hombros): [employer] el peso hacia adelante [employer] dedos de los pies, MANTENER/SENTIR. Mueva su peso hacia atrs para igualar el pie plano en ambas piernas, MANTENER/SENTIR. Mueva su peso nuevamente sobre sus talones, MANTENER/SENTIR. Mueva su peso hacia atrs para igualarlo en ambas piernas, MANTENER/SENTIR. Repetir. comience con ambas manos en el  fregadero, avance solo [employer] mano derecha, luego sin [place]. 3. Conos/copas en movimiento: Con el mismo peso en cada pierna: Sujtese con una mano la primera vez, luego avance hasta no apoyar las manos. Mueva las tazas de un lado del fregadero  al otro. Coloque los vasos a ~2" fuera de su alcance, avance hasta 10" ms all de su alcance. Coloque una mano en el medio del fregadero y extienda la mano con la otra. Haz ambos brazos. Luego coloque una mano y [place] las tazas con la otra. 4. Alcanzar hacia arriba/hacia Tomasita Crumble: se alterna el alcance hasta los gabinetes superiores o el techo si no hay gabinetes presentes. Mantenga el mismo peso en cada pierna. Comience con Edison Simon apoyada en el mostrador mientras la otra mano se extiende y avance hasta no apoyar la mano al [place]ista. Coloque una mano en el medio del fregadero y extindala con la [place]. Haz ambos brazos. Luego coloque una mano y [place] las tazas con la otra. 5. Mirando por encima de los hombros: Con el mismo peso en cada pierna: gire alternativamente para mirar por encima de los hombros con una mano apoyada en el mostrador segn sea necesario. Comience con movimientos de la [place] solo para mirar delante del hombro, luego incluso con el hombro y Programme researcher, broadcasting/film/video mirar detrs de usted. Para mirar hacia un lado, mueva la cabeza/los ojos, luego el hombro del costado que mira [place], cambie ms peso hacia el costado y tire de la cadera Risk manager. Coloca una mano en el medio del fregadero y suelta la otra para que tu hombro pueda retroceder. Cambia de mano para mirar hacia otro lado. Luego coloque una mano y [place] las tazas con la otra. 6. Al pisar con una pierna que no est amputada: retire de su camino los artculos que se encuentran debajo del gabinete. Mueva las caderas/pelvis para que el peso recaiga sobre la prtesis. PASE LENTAMENTE la otra pierna de modo que la parte delantera del pie quede dentro del gabinete. Luego retroceda al piso.  ASSESSMENT:  CLINICAL IMPRESSION: Emphasis of skilled PT session on assessing gait with RW, SBQC, and SPC to determine safest device for patient to continue using at this time. Pt does appear to have a leg length discrepancy with his LLE being longer  and needing to have his prosthetic adjusted again by [facility]. Additionally, pt exhibits overall good balance in the clinic with use of SBQC but outdoors in a functional environment he has several LOB due to LLE catching on the sidewalk leading to buckling of L knee and needing mod A to prevent a fall. Also, pt exhibits significant energy expenditure required to ambulate with a SPC this date and would not benefit from continued trials of this device at this time. Pt can benefit from continued practice with [facility], especially after his prosthetic has been adjusted to continue to work towards increasing his safety and independence with this device. Continue POC.   OBJECTIVE IMPAIRMENTS: Abnormal gait, decreased activity tolerance, decreased balance, decreased coordination, decreased endurance, decreased knowledge of use of DME, decreased mobility, difficulty walking, decreased ROM, decreased strength, decreased safety awareness, increased edema, improper body mechanics, and prosthetic dependency .   ACTIVITY LIMITATIONS: carrying, lifting, bending, standing, squatting, stairs, transfers, locomotion level, and caring for others  PARTICIPATION LIMITATIONS: meal prep, cleaning, laundry, driving, shopping, community activity, and yard work  PERSONAL FACTORS: Age, Education, Fitness, Past/current experiences, Transportation, and 3+ comorbidities: HTN, CKD, DM2, and prostate cancer w/ ongoing radiation  are also affecting patient's functional outcome.   REHAB POTENTIAL: Good  CLINICAL DECISION MAKING: Evolving/moderate complexity  EVALUATION COMPLEXITY: Moderate   GOALS: Goals reviewed with patient? Yes  SHORT TERM GOALS: Target date: 08/09/2022  Pt will be independent and compliant with initial strength and balance HEP to improve functional mobility and prosthetic management. Baseline:  To be established. Goal status: INITIAL  2.  Pt will decrease 5xSTS to </=13 seconds w/o UE support in order to  demonstrate decreased risk for falls and improved functional bilateral LE strength and power. Baseline: 16.45 sec w/ BUE support Goal status: INITIAL  3.  Pt will ambulate >/=605 feet on to demonstrate improved functional endurance for home and community participation. Baseline:  405' continuously w/ standard walker Goal status: INITIAL  4.  Pt will demonstrate a gait speed of >/=1.48 feet/sec in order to decrease risk for falls. Baseline: 1.18 ft/sec w/ standard walker Goal status: INITIAL  5.  Pt will improve Berg score to 29/56 for decreased fall risk Baseline:  24/56 (5/6) Goal status: INITIAL  LONG TERM GOALS: Target date: 09/06/2022  Patient to maintain established walking program x3 days per week to improve cardiovascular tolerance and functional mobility w/ prosthesis and LRAD as needed. Baseline:  To be established. Goal status: INITIAL  2.  Pt will ambulate >/=805 feet on to demonstrate improved functional endurance for home and community participation. Baseline: 405' continuously w/ standard walker Goal status: INITIAL  3.  Pt will demonstrate a gait speed of >/=1.78 feet/sec in order to decrease risk for falls. Baseline: 1.18 ft/sec w/ standard walker Goal status: INITIAL  4.  Pt will improve Berg score to 34/56 for decreased fall risk Baseline:  24/56 (5/6) Goal status: INITIAL  5.  Pt will ambulate >/=250 feet over level surfaces using quad cane for improved access to home environment. Baseline: To be initiated. Goal status: INITIAL  6.  Pt will navigate 4 stairs SBA using single rail and cane as necessary to improve entry to home environment. Baseline:  To be assessed. Goal status: INITIAL   PLAN:  PT FREQUENCY: 2x/week  PT DURATION: 8 weeks  PLANNED INTERVENTIONS: Therapeutic exercises, Therapeutic activity, Neuromuscular re-education, Balance training, Gait training, Patient/Family education, Self Care, Joint mobilization, Stair training,  Vestibular training, Prosthetic training, DME instructions, Manual therapy, and Re-evaluation  PLAN FOR NEXT SESSION: Assess left BP-did he take meds?  How is sink HEP?  functional hip strengthening, walking program-translate to [place].  Gait training w/ walker vs 4-prong cane.  Working towards quad cane for level ground and stairs.  Peter Congo, PT, DPT, CSRS 07/29/2022, 2:43 PM

## 2022-07-30 ENCOUNTER — Ambulatory Visit (INDEPENDENT_AMBULATORY_CARE_PROVIDER_SITE_OTHER): Payer: 59 | Admitting: Orthopedic Surgery

## 2022-07-30 DIAGNOSIS — M7661 Achilles tendinitis, right leg: Secondary | ICD-10-CM

## 2022-07-30 DIAGNOSIS — Z89612 Acquired absence of left leg above knee: Secondary | ICD-10-CM | POA: Diagnosis not present

## 2022-07-30 DIAGNOSIS — Z191 Hormone sensitive malignancy status: Secondary | ICD-10-CM | POA: Diagnosis not present

## 2022-07-30 DIAGNOSIS — Z510 Encounter for antineoplastic radiation therapy: Secondary | ICD-10-CM | POA: Diagnosis not present

## 2022-07-31 ENCOUNTER — Encounter: Payer: Self-pay | Admitting: Orthopedic Surgery

## 2022-07-31 DIAGNOSIS — Z510 Encounter for antineoplastic radiation therapy: Secondary | ICD-10-CM | POA: Diagnosis not present

## 2022-07-31 DIAGNOSIS — Z191 Hormone sensitive malignancy status: Secondary | ICD-10-CM | POA: Diagnosis not present

## 2022-07-31 NOTE — Progress Notes (Signed)
Office Visit Note   Patient: Tim Vincent           Date of Birth: 1955-11-25           MRN: [id number] Visit Date: 07/30/2022              Requested by: Filomena Jungling, NP [address] [place],  [place] [address] PCP: Filomena Jungling, NP  Chief Complaint  Patient presents with   Right Heel - Follow-up   Left Leg - Follow-up    Hx AKA      HPI: Patient is a 67 year old gentleman who presents for 2 separate issues.  Patient is having trouble with his socket for the left above-knee amputation.  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  Patient has had relief with the low-dose prednisone for the bursitis on the right heel.  Assessment & Plan: Visit Diagnoses:  1. S/P above knee amputation, left (HCC)     Plan: Patient with continue with strengthening for the left lower extremity.  The left leg is actually shorter and with increased strengthening he should have increased ability to swing the leg through.  Follow-Up Instructions: No follow-ups on file.   Ortho Exam  Patient is alert, oriented, no adenopathy, well-dressed, normal affect, normal respiratory effort. Examination there is no redness or cellulitis over the right Achilles bursa at the Haglund's deformity.  Patient has a firm nodule in the Achilles but no tenderness to palpation.  Examination was standing patient feels like the left leg is longer however his pelvis indicates the left leg is slightly shorter.  When patient ambulates he abducts the leg to make it easier to swing through this is secondary to weakness not a leg length inequality.  Imaging: No results found. No images are attached to the encounter.  Labs: Lab Results  Component Value Date   HGBA1C 6.3 (H) 04/17/2022   LABURIC 7.8 04/21/2022   REPTSTATUS 04/21/2022 FINAL 04/17/2022   GRAMSTAIN  04/17/2022    NO WBC SEEN FEW GRAM NEGATIVE RODS FEW GRAM POSITIVE COCCI    CULT  04/17/2022    FEW  ENTEROCOCCUS FAECALIS FEW STREPTOCOCCUS MITIS/ORALIS MODERATE PREVOTELLA BIVIA BETA LACTAMASE POSITIVE Performed at [facility], [address], [place], [place] [address]    [facility] ENTEROCOCCUS FAECALIS 04/17/2022   LABORGA STREPTOCOCCUS MITIS/ORALIS 04/17/2022     Lab Results  Component Value Date   ALBUMIN 2.5 (L) 04/16/2022    Lab Results  Component Value Date   MG 2.1 04/18/2022   No results found for: "VD25OH"  No results found for: "PREALBUMIN"    Latest Ref Rng & Units 04/21/2022    2:19 AM 04/19/2022    2:17 AM 04/18/2022    1:58 AM  CBC EXTENDED  WBC 4.0 - 10.5 K/uL 10.6  12.4  11.4   RBC 4.22 - 5.81 MIL/uL 3.87  3.50  3.32   Hemoglobin 13.0 - 17.0 g/dL 62.1  30.8  9.9   HCT 65.7 - 52.0 % 35.8  32.4  30.7   Platelets 150 - 400 K/uL 304  299  250      There is no height or weight on file to calculate BMI.  Orders:  No orders of the defined types were placed in this encounter.  No orders of the defined types were placed in this encounter.    Procedures: No procedures performed  Clinical Data: No additional findings.  ROS:  All other systems negative,  except as noted in the HPI. Review of Systems  Objective: Vital Signs: There were no vitals taken for this visit.  Specialty Comments:  No specialty comments available.  PMFS History: Patient Active Problem List   Diagnosis Date Noted   Diabetic infection of left foot (HCC) 04/17/2022   Necrotizing fasciitis of lower leg (HCC) 04/17/2022   Dyslipidemia 04/17/2022   Essential hypertension 01/12/2021   Obesity 01/12/2021   Skin sensation disturbance 01/12/2021   Type 2 diabetes mellitus without complication (HCC) 01/12/2021   Diabetic neuropathy (HCC) 01/12/2021   Hav (hallux abducto valgus), unspecified laterality 01/12/2021   Cough 10/31/2017   Elevated blood pressure reading 10/31/2017   Past Medical History:  Diagnosis Date   Diabetes mellitus without complication (HCC)     Hypertension     History reviewed. No pertinent family history.  Past Surgical History:  Procedure Laterality Date   AMPUTATION Left 04/17/2022   Procedure: LEFT ABOVE KNEE AMPUTATION;  Surgeon: Nadara Mustard, MD;  Location: [facility] OR;  Service: Orthopedics;  Laterality: Left;   MASS EXCISION Left 04/17/2021   Procedure: EXCISION LEFT ELBOW MASS;  Surgeon: Allena Napoleon, MD;  Location: [facility];  Service: Plastics;  Laterality: Left;  1 hour   MASS EXCISION Left 08/10/2021   Procedure: EXCISION ELBOW MASS;  Surgeon: Allena Napoleon, MD;  Location: [facility];  Service: Plastics;  Laterality: Left;  1 hour   Social History   Occupational History   Not on file  Tobacco Use   Smoking status: Never   Smokeless tobacco: Never  Vaping Use   Vaping Use: Never used  Substance and Sexual Activity   Alcohol use: Yes    Alcohol/week: 6.0 standard drinks of alcohol    Types: 6 Cans of beer per week   Drug use: Never   Sexual activity: Not on file

## 2022-08-01 ENCOUNTER — Ambulatory Visit: Payer: 59 | Admitting: Physical Therapy

## 2022-08-01 DIAGNOSIS — R2681 Unsteadiness on feet: Secondary | ICD-10-CM | POA: Diagnosis not present

## 2022-08-01 DIAGNOSIS — Z510 Encounter for antineoplastic radiation therapy: Secondary | ICD-10-CM | POA: Diagnosis not present

## 2022-08-01 DIAGNOSIS — M6281 Muscle weakness (generalized): Secondary | ICD-10-CM | POA: Diagnosis not present

## 2022-08-01 DIAGNOSIS — Z191 Hormone sensitive malignancy status: Secondary | ICD-10-CM | POA: Diagnosis not present

## 2022-08-01 DIAGNOSIS — Z89612 Acquired absence of left leg above knee: Secondary | ICD-10-CM | POA: Diagnosis not present

## 2022-08-01 DIAGNOSIS — R2689 Other abnormalities of gait and mobility: Secondary | ICD-10-CM | POA: Diagnosis not present

## 2022-08-01 NOTE — Therapy (Signed)
OUTPATIENT PHYSICAL THERAPY PROSTHETICS TREATMENT   Patient Name: Tim Vincent MRN: [id number] DOB:07-07-1955, 67 y.o., male Today's Date: 08/01/2022  PCP: Filomena Jungling, NP REFERRING PROVIDER: Nadara Mustard, MD  END OF SESSION:  PT End of Session - 08/01/22 1536     Visit Number 6    Number of Visits 17   16 + eval   Date for PT Re-Evaluation 09/13/22   pushed out due to frequency and scheduling conflicts w/ radiation therapy   Authorization Type [employer] MEDICARE    Progress Note Due on Visit 10    PT Start Time 1532   pt arrived late   PT Stop Time 1615    PT Time Calculation (min) 43 min    Equipment Utilized During Treatment Gait belt    Activity Tolerance Patient tolerated treatment well    Behavior During Therapy WFL for tasks assessed/performed               Past Medical History:  Diagnosis Date   Diabetes mellitus without complication (HCC)    Hypertension    Past Surgical History:  Procedure Laterality Date   AMPUTATION Left 04/17/2022   Procedure: LEFT ABOVE KNEE AMPUTATION;  Surgeon: Nadara Mustard, MD;  Location: [facility] OR;  Service: Orthopedics;  Laterality: Left;   MASS EXCISION Left 04/17/2021   Procedure: EXCISION LEFT ELBOW MASS;  Surgeon: Allena Napoleon, MD;  Location: [facility];  Service: Plastics;  Laterality: Left;  1 hour   MASS EXCISION Left 08/10/2021   Procedure: EXCISION ELBOW MASS;  Surgeon: Allena Napoleon, MD;  Location: [facility];  Service: Plastics;  Laterality: Left;  1 hour   Patient Active Problem List   Diagnosis Date Noted   Diabetic infection of left foot (HCC) 04/17/2022   Necrotizing fasciitis of lower leg (HCC) 04/17/2022   Dyslipidemia 04/17/2022   Essential hypertension 01/12/2021   Obesity 01/12/2021   Skin sensation disturbance 01/12/2021   Type 2 diabetes mellitus without complication (HCC) 01/12/2021   Diabetic neuropathy (HCC) 01/12/2021   Hav (hallux abducto valgus),  unspecified laterality 01/12/2021   Cough 10/31/2017   Elevated blood pressure reading 10/31/2017    ONSET DATE: 04/17/2022 (date of amputation)  REFERRING DIAG: W09.811 (ICD-10-CM) - S/P above knee amputation, left (HCC)  THERAPY DIAG:  Other abnormalities of gait and mobility  Muscle weakness (generalized)  Unsteadiness on feet  Rationale for Evaluation and Treatment: Rehabilitation  SUBJECTIVE:   SUBJECTIVE STATEMENT: Pt reports he went to see Dr. Lajoyce Corners yesterday, was told his L prosthetic is not too long but that he is swinging his LLE out due to hip weakness.  Pt accompanied by: family Doctor, hospital; Spanish-language interpreter Mariel  PERTINENT HISTORY: HTN, DM2, CKD, prostate cancer undergoing radiation treatment and androgen deprivation therapy  PAIN:  Are you having pain? No  PRECAUTIONS: Fall; pt is having radiation therapy M-F in the mornings  WEIGHT BEARING RESTRICTIONS: No  FALLS: Has patient fallen in last 6 months? Yes. Number of falls 2-his did not have prosthetic donned, thought his foot was there and tried to step and went down  LIVING ENVIRONMENT: Lives with: lives with their family and lives with their spouse-2 adult children, 1 son-in-law, 3 infant children Lives in: House/apartment Home Access: Stairs to enter Home layout: One level Stairs: Yes: External: 4 steps; bilateral but cannot reach both Has following equipment at home: Dan Humphreys - 2 wheeled, Wheelchair (manual), and shower chair  PLOF: Independent with household  mobility with device, Independent with transfers, Requires assistive device for independence, and Needs assistance with homemaking  PATIENT GOALS: "To walk better and learn to use the prosthetic."  OBJECTIVE:   DIAGNOSTIC FINDINGS: No recent relevant findings.  COGNITION: Overall cognitive status: Within functional limits for tasks assessed   SENSATION: Light touch: WFL  POSTURE: No Significant postural limitations;  pt has right mass superior to olecranon (per MD notes related to gout)  LOWER EXTREMITY ROM:  Active ROM Right eval Left eval  Hip flexion WNL WFL  Hip extension    Hip abduction    Hip adduction    Hip internal rotation    Hip external rotation    Knee flexion    Knee extension    Ankle dorsiflexion    Ankle plantarflexion    Ankle inversion    Ankle eversion     (Blank rows = not tested)  LOWER EXTREMITY MMT:  MMT Right eval Left eval  Hip flexion 4+/5 4/5  Hip extension    Hip abduction 4/5 3+/5  Hip adduction 5/5 3/5  Hip internal rotation    Hip external rotation    Knee flexion 5/5   Knee extension 5/5   Ankle dorsiflexion 5/5   Ankle plantarflexion    Ankle inversion    Ankle eversion    (Blank rows = not tested)  BED MOBILITY:  Sit to supine Complete Independence Supine to sit Complete Independence Rolling to Right Complete Independence Rolling to Left Complete Independence  TRANSFERS: Sit to stand: Complete Independence Stand to sit: Complete Independence Floor transfers: daughter states family assisted when pt fell to floor  GAIT: Gait pattern: step to pattern, decreased hip/knee flexion- Left, and trunk flexed Distance walked: 405' + various clinic distances Assistive device utilized:  standard no wheel walker Level of assistance: SBA and CGA Gait velocity: 1.18 ft/sec Comments: Pt traverses too far forward into walker w/ intermittent misplacement of prosthetic causing toe catch x2.  He advances standard walker too far forward during each stride resulting in large step-to stride pattern.  FUNCTIONAL TESTS:  5 times sit to stand: 16.45 seconds w/ BUE support-transfers UE support to RW, needed restart x1 due to instability initially 6 minute walk test: 405' w/ standard walker 10 meter walk test: 28.07 seconds w/ standard walker = 0.36 m/sec OR 1.18 ft/sec Berg Balance Scale: To be assessed.  CURRENT PROSTHETIC WEAR ASSESSMENT: Patient is  independent with: residual limb care, care of non-amputated limb, prosthetic cleaning, and ply sock cleaning Patient is dependent with: skin check, correct ply sock adjustment, proper wear schedule/adjustment, and proper weight-bearing schedule/adjustment Donning prosthesis: Complete Independence Doffing prosthesis: Complete Independence Prosthetic wear tolerance: 8 hours inconsistently/day, 7 days/week  Prosthetic weight bearing tolerance: 1 hour Edema: no visible swelling on eval, has stopped wearing shrinker. Residual limb condition: Sweat/moist, clean, well-shaped, mild invagination of scar that is well healed, less hair than proximal leg, warm. Prosthetic description:  ischial containment suction socket w/ flexible inner liner w/ polycentric 4-bar knee w/ pneumatic swing control and fixed ankle SACH foot K code/activity level with prosthetic use: Level 3   TODAY'S TREATMENT:  Assessed in sitting at beginning of session: There were no vitals filed for this visit.  TherEx: Added hip strengthening therex to HEP, see bolded below. Provided to patient in Spanish.  Gait: Session focus on making sure that pt is fully getting his residual limb down into his socket as this was likely contributing to limb length and leading to circumduction noted in previous session. Pt with difficulty opening up vacuum socket, able to adjust with use of tools. Pt initially found to be not fully getting his residual limb down into socket but is able to increase depth of residual limb into socket as session progresses.  RAMP:  Level of Assistance: CGA Assistive device utilized: Quad cane small base Ramp Comments: practice on ascending/descending ramp with SBQC  CURB:  Level of Assistance: Min A Assistive device utilized: Museum/gallery exhibitions officer Comments: practice on  ascending/descending ramp with SBQC  GAIT: Gait pattern: decreased hip/knee flexion- Left Distance walked: 230 ft Assistive device utilized: Quad cane small base Level of assistance: CGA Comments: trial gait around therapy gym with SBQC; pt exhibits decreased circumduction of LLE this session and compared to previous session     AT SINK FIND YOUR MIDLINE POSITION AND PLACE FEET EQUAL DISTANCE FROM THE MIDLINE.  Try to find this position when standing still for activities.   USE TAPE ON FLOOR TO MARK THE MIDLINE POSITION. You also should try to feel with your limb pressure in socket.  You are trying to feel with limb what you used to feel with the bottom of your foot.  Side to Side Shift: Moving your hips only (not shoulders): move weight onto your left leg, HOLD/FEEL.  Move back to equal weight on each leg, HOLD/FEEL. Move weight onto your right leg, HOLD/FEEL. Move back to equal weight on each leg, HOLD/FEEL. Repeat.  Start with both hands on sink, progress to right hand only, then no hands.  Front to Back Shift: Moving your hips only (not shoulders): move your weight forward onto your toes, HOLD/FEEL. Move your weight back to equal Flat Foot on both legs, HOLD/FEEL. Move your weight back onto your heels, HOLD/FEEL. Move your weight back to equal on both legs, HOLD/FEEL. Repeat.   tart with both hands on sink, progress to right hand only, then no hands. Moving Cones / Cups: With equal weight on each leg: Hold on with one hand the first time, then progress to no hand supports. Move cups from one side of sink to the other. Place cups ~2" out of your reach, progress to 10" beyond reach.  Place one hand in middle of sink and reach with other hand. Do both arms.  Then hover one hand and move cups with other hand.  Overhead/Upward Reaching: alternated reaching up to top cabinets or ceiling if no cabinets present. Keep equal weight on each leg. Start with one hand support on counter while other hand  reaches and progress to no hand support with reaching.  ace one hand in middle of sink and reach with other hand. Do both arms.  Then hover one hand and move cups with other hand.  5.   Looking Over Shoulders: With equal weight on each leg: alternate turning to look over your shoulders with one hand support on counter as needed.  Start with head motions only to look in front of shoulder, then even with shoulder and progress to looking behind you. To look to side, move head /eyes, then shoulder on side looking pulls back,  shift more weight to side looking and pull hip back. ace one hand in middle of sink and let go with other hand so your shoulder can pull back. Switch hands to look other way.   Then hover one hand and move cups with other hand.  6.  Stepping with leg that is not amputated:  Move items under cabinet out of your way. Shift your hips/pelvis so weight on prosthesis. SLOWLY step other leg so front of foot is in cabinet. Then step back to floor.    PATIENT EDUCATION: Education details: continue HEP, added to HEP Person educated: Patient and Child(ren) Education method: Explanation, Demonstration, and Handouts Education comprehension: verbalized understanding and needs further education  HOME EXERCISE PROGRAM: Access Code: [email or web address] URL: [email or web address] Date: 07/15/2022 Prepared by: Peter Congo  Exercises - Prone Hip Flexor Stretch with Towel Roll (AKA)  - 1 x daily - 7 x weekly - 1 sets - 1 reps - 10 minutes hold - Standing Hip Flexion March  - 1 x daily - 7 x weekly - 3 sets - 10 reps - Standing Hip Abduction  - 1 x daily - 7 x weekly - 3 sets - 10 reps - Standing Hip Extension with Chair  - 1 x daily - 7 x weekly - 3 sets - 10 reps  AMPUTEE SINK HEP (Provided to pt and daughter in [place] and spanish 5/16) Haz cada ejercicio 1-2 veces al da. Haz cada ejercicio 10 repeticiones. Mantenga cada ejercicio durante 2 segundos para sentir su ubicacin.  EN EL  FREGADERO ENCUENTRE SU POSICIN EN LA LNEA MEDIA Y COLOQUE LOS PIES A LA IGUAL DISTANCIA DE LA LNEA MEDIA. Trate de encontrar esta posicin cuando est quieto para Arts development officer.  UTILICE CINTA EN EL PISO PARA MARCAR LA POSICIN DE LA LNEA MEDIA. Tambin debe intentar sentir con la extremidad la presin en la cavidad. Ests tratando de sentir con las extremidades lo que solas sentir con la planta del pie.  1. Desplazamiento de lado a lado: moviendo solo las caderas (no los hombros): [employer] el peso hacia la pierna izquierda, MANTENER/SENTIR. Vuelva a colocar el mismo peso en cada pierna, MANTENER/SENTIR. Mueva el peso sobre su pierna derecha, MANTENER/SENTIR. Vuelva a colocar el mismo peso en cada pierna, MANTENER/SENTIR. Repetir. Comience con ambas manos en el lavabo, avance solo hacia la derecha y luego sin manos. 2. Cambio de adelante hacia atrs: moviendo solo las caderas (no los hombros): [employer] el peso hacia adelante [employer] dedos de los pies, MANTENER/SENTIR. Mueva su peso hacia atrs para igualar el pie plano en ambas piernas, MANTENER/SENTIR. Mueva su peso nuevamente sobre sus talones, MANTENER/SENTIR. Mueva su peso hacia atrs para igualarlo en ambas piernas, MANTENER/SENTIR. Repetir. comience con ambas manos en el fregadero, avance solo [employer] mano derecha, luego sin [place]. 3. Conos/copas en movimiento: Con el mismo peso en cada pierna: Sujtese con una mano la primera vez, luego avance hasta no apoyar las manos. Mueva las tazas de un lado del fregadero al otro. Coloque los vasos a ~2" fuera de su alcance, avance hasta 10" ms all de su alcance. Coloque una mano en el medio del fregadero y extienda la mano con la otra. Haz ambos brazos. Luego coloque una mano y [place] las tazas con la otra. 4. Alcanzar hacia arriba/hacia Tomasita Crumble: se alterna el alcance hasta los gabinetes superiores o el techo si no hay gabinetes presentes. Mantenga el mismo peso en cada pierna. Comience con Neomia Dear mano  apoyada en el mostrador mientras la  Cleotis Lema se extiende y avance hasta no apoyar la mano al Barista. Coloque una mano en el medio del fregadero y extindala con la [place]. Haz ambos brazos. Luego coloque una mano y [place] las tazas con la otra. 5. Mirando por encima de los hombros: Con el mismo peso en cada pierna: gire alternativamente para mirar por encima de los hombros con una mano apoyada en el mostrador segn sea necesario. Comience con movimientos de la [place] solo para mirar delante del hombro, luego incluso con el hombro y Programme researcher, broadcasting/film/video mirar detrs de usted. Para mirar hacia un lado, mueva la cabeza/los ojos, luego el hombro del costado que mira [place], cambie ms peso hacia el costado y tire de la cadera Risk manager. Coloca una mano en el medio del fregadero y suelta la otra para que tu hombro pueda retroceder. Cambia de mano para mirar hacia otro lado. Luego coloque una mano y [place] las tazas con la otra. 6. Al pisar con una pierna que no est amputada: retire de su camino los artculos que se encuentran debajo del gabinete. Mueva las caderas/pelvis para que el peso recaiga sobre la prtesis. PASE [place] la otra pierna de modo que la parte delantera del pie quede dentro del gabinete. Luego retroceda al piso.  ASSESSMENT:  CLINICAL IMPRESSION: Emphasis of skilled PT session on problem-solving fit of LLE prosthetic as pt tends to not fully get his residual limb fully into socket which is leading to circumduction with gait. Pt exhibits improved ability to get deeper into socket this session with improved gait mechanics noted with trial of SBQC this session. Also provided hip strengthening HEP to work on improving strength to also improve gait mechanics. Pt continues to benefit from skilled therapy services to work on improving his LLE strength and continue to work on improved wear of his prosthetic to allow for more functional gait. Continue POC.   OBJECTIVE IMPAIRMENTS: Abnormal  gait, decreased activity tolerance, decreased balance, decreased coordination, decreased endurance, decreased knowledge of use of DME, decreased mobility, difficulty walking, decreased ROM, decreased strength, decreased safety awareness, increased edema, improper body mechanics, and prosthetic dependency .   ACTIVITY LIMITATIONS: carrying, lifting, bending, standing, squatting, stairs, transfers, locomotion level, and caring for others  PARTICIPATION LIMITATIONS: meal prep, cleaning, laundry, driving, shopping, community activity, and yard work  PERSONAL FACTORS: Age, Education, Fitness, Past/current experiences, Transportation, and 3+ comorbidities: HTN, CKD, DM2, and prostate cancer w/ ongoing radiation  are also affecting patient's functional outcome.   REHAB POTENTIAL: Good  CLINICAL DECISION MAKING: Evolving/moderate complexity  EVALUATION COMPLEXITY: Moderate   GOALS: Goals reviewed with patient? Yes  SHORT TERM GOALS: Target date: 08/09/2022  Pt will be independent and compliant with initial strength and balance HEP to improve functional mobility and prosthetic management. Baseline:  To be established. Goal status: INITIAL  2.  Pt will decrease 5xSTS to </=13 seconds w/o UE support in order to demonstrate decreased risk for falls and improved functional bilateral LE strength and power. Baseline: 16.45 sec w/ BUE support Goal status: INITIAL  3.  Pt will ambulate >/=605 feet on to demonstrate improved functional endurance for home and community participation. Baseline:  405' continuously w/ standard walker Goal status: INITIAL  4.  Pt will demonstrate a gait speed of >/=1.48 feet/sec in order to decrease risk for falls. Baseline: 1.18 ft/sec w/ standard walker Goal status: INITIAL  5.  Pt will improve Berg score to 29/56 for decreased fall risk Baseline:  24/56 (5/6) Goal  status: INITIAL  LONG TERM GOALS: Target date: 09/06/2022  Patient to maintain established  walking program x3 days per week to improve cardiovascular tolerance and functional mobility w/ prosthesis and LRAD as needed. Baseline:  To be established. Goal status: INITIAL  2.  Pt will ambulate >/=805 feet on to demonstrate improved functional endurance for home and community participation. Baseline: 405' continuously w/ standard walker Goal status: INITIAL  3.  Pt will demonstrate a gait speed of >/=1.78 feet/sec in order to decrease risk for falls. Baseline: 1.18 ft/sec w/ standard walker Goal status: INITIAL  4.  Pt will improve Berg score to 34/56 for decreased fall risk Baseline:  24/56 (5/6) Goal status: INITIAL  5.  Pt will ambulate >/=250 feet over level surfaces using quad cane for improved access to home environment. Baseline: To be initiated. Goal status: INITIAL  6.  Pt will navigate 4 stairs SBA using single rail and cane as necessary to improve entry to home environment. Baseline:  To be assessed. Goal status: INITIAL   PLAN:  PT FREQUENCY: 2x/week  PT DURATION: 8 weeks  PLANNED INTERVENTIONS: Therapeutic exercises, Therapeutic activity, Neuromuscular re-education, Balance training, Gait training, Patient/Family education, Self Care, Joint mobilization, Stair training, Vestibular training, Prosthetic training, DME instructions, Manual therapy, and Re-evaluation  PLAN FOR NEXT SESSION: Assess left BP-did he take meds?  How is sink HEP?  functional hip strengthening, walking program-translate to [place].  Gait training w/ walker vs 4-prong cane.  Working towards quad cane for level ground and stairs.  Peter Congo, PT, DPT, CSRS 08/01/2022, 4:21 PM

## 2022-08-02 DIAGNOSIS — Z191 Hormone sensitive malignancy status: Secondary | ICD-10-CM | POA: Diagnosis not present

## 2022-08-02 DIAGNOSIS — Z510 Encounter for antineoplastic radiation therapy: Secondary | ICD-10-CM | POA: Diagnosis not present

## 2022-08-06 ENCOUNTER — Encounter: Payer: Self-pay | Admitting: Physical Therapy

## 2022-08-06 ENCOUNTER — Ambulatory Visit: Payer: 59 | Admitting: Physical Therapy

## 2022-08-06 VITALS — BP 168/64 | HR 69

## 2022-08-06 DIAGNOSIS — R2681 Unsteadiness on feet: Secondary | ICD-10-CM | POA: Diagnosis not present

## 2022-08-06 DIAGNOSIS — Z89612 Acquired absence of left leg above knee: Secondary | ICD-10-CM | POA: Diagnosis not present

## 2022-08-06 DIAGNOSIS — M6281 Muscle weakness (generalized): Secondary | ICD-10-CM | POA: Diagnosis not present

## 2022-08-06 DIAGNOSIS — R2689 Other abnormalities of gait and mobility: Secondary | ICD-10-CM | POA: Diagnosis not present

## 2022-08-06 NOTE — Patient Instructions (Addendum)
Por favor use su andador.  Puedes caminar durante un cierto perodo de Pharmacist, community                          Camine 10 minutos 1 vez al da.             Aumentar 2 minutos cada 7 das              Trabaje hasta 20 minutos seguidos (1 vez al da).               Ejemplo:                         Da 1-2           4-5 minutos     3 veces al da                         Da 7-8           10-12 minutos 2-3 veces al da                         Da 13-14       20-22 minutos 1-2 veces al Futures trader

## 2022-08-06 NOTE — Therapy (Signed)
OUTPATIENT PHYSICAL THERAPY PROSTHETICS TREATMENT   Patient Name: Tim Vincent MRN: [id number] DOB:04-15-1955, 67 y.o., male Today's Date: 08/06/2022  PCP: Tim Jungling, NP REFERRING PROVIDER: Nadara Mustard, MD  END OF SESSION:  PT End of Session - 08/06/22 1409     Visit Number 7    Number of Visits 17   16 + eval   Date for PT Re-Evaluation 09/13/22   pushed out due to frequency and scheduling conflicts w/ radiation therapy   Authorization Type [employer]    Progress Note Due on Visit 10    PT Start Time 1405    PT Stop Time 1449    PT Time Calculation (min) 44 min    Equipment Utilized During Treatment Gait belt    Activity Tolerance Patient tolerated treatment well    Behavior During Therapy WFL for tasks assessed/performed               Past Medical History:  Diagnosis Date   Diabetes mellitus without complication (HCC)    Hypertension    Past Surgical History:  Procedure Laterality Date   AMPUTATION Left 04/17/2022   Procedure: LEFT ABOVE KNEE AMPUTATION;  Surgeon: Tim Mustard, MD;  Location: [facility];  Service: Orthopedics;  Laterality: Left;   MASS EXCISION Left 04/17/2021   Procedure: EXCISION LEFT ELBOW MASS;  Surgeon: Tim Napoleon, MD;  Location: [facility];  Service: Plastics;  Laterality: Left;  1 hour   MASS EXCISION Left 08/10/2021   Procedure: EXCISION ELBOW MASS;  Surgeon: Tim Napoleon, MD;  Location: [facility];  Service: Plastics;  Laterality: Left;  1 hour   Patient Active Problem List   Diagnosis Date Noted   Diabetic infection of left foot (HCC) 04/17/2022   Necrotizing fasciitis of lower leg (HCC) 04/17/2022   Dyslipidemia 04/17/2022   Essential hypertension 01/12/2021   Obesity 01/12/2021   Skin sensation disturbance 01/12/2021   Type 2 diabetes mellitus without complication (HCC) 01/12/2021   Diabetic neuropathy (HCC) 01/12/2021   Hav (hallux abducto valgus), unspecified laterality  01/12/2021   Cough 10/31/2017   Elevated blood pressure reading 10/31/2017    ONSET DATE: 04/17/2022 (date of amputation)  REFERRING DIAG: N82.956 (ICD-10-CM) - S/P above knee amputation, left (HCC)  THERAPY DIAG:  Other abnormalities of gait and mobility  Muscle weakness (generalized)  Unsteadiness on feet  Rationale for Evaluation and Treatment: Rehabilitation  SUBJECTIVE:   SUBJECTIVE STATEMENT: Pt denies pain, difficulty donning and sinking all the way into socket today, and recent falls or near falls.  He finished radiation this past Friday, he will receive another shot on July 3 and be rechecked.  Pt accompanied by: family member-daughter Tim Vincent; Spanish-language interpreter Tim Vincent  PERTINENT HISTORY: HTN, DM2, CKD, prostate cancer undergoing radiation treatment and androgen deprivation therapy  PAIN:  Are you having pain? No  PRECAUTIONS: Fall; pt is having radiation therapy M-F in the mornings  WEIGHT BEARING RESTRICTIONS: No  FALLS: Has patient fallen in last 6 months? Yes. Number of falls 2-his did not have prosthetic donned, thought his foot was there and tried to step and went down  LIVING ENVIRONMENT: Lives with: lives with their family and lives with their spouse-2 adult children, 1 son-in-law, 3 infant children Lives in: House/apartment Home Access: Stairs to enter Home layout: One level Stairs: Yes: External: 4 steps; bilateral but cannot reach both Has following equipment at home: Dan Humphreys - 2 wheeled, Wheelchair (manual), and shower chair  PLOF:  Independent with household mobility with device, Independent with transfers, Requires assistive device for independence, and Needs assistance with homemaking  PATIENT GOALS: "To walk better and learn to use the prosthetic."  OBJECTIVE:   DIAGNOSTIC FINDINGS: No recent relevant findings.  COGNITION: Overall cognitive status: Within functional limits for tasks assessed   SENSATION: Light touch:  WFL  POSTURE: No Significant postural limitations; pt has right mass superior to olecranon (per MD notes related to gout)  LOWER EXTREMITY ROM:  Active ROM Right eval Left eval  Hip flexion WNL WFL  Hip extension    Hip abduction    Hip adduction    Hip internal rotation    Hip external rotation    Knee flexion    Knee extension    Ankle dorsiflexion    Ankle plantarflexion    Ankle inversion    Ankle eversion     (Blank rows = not tested)  LOWER EXTREMITY MMT:  MMT Right eval Left eval  Hip flexion 4+/5 4/5  Hip extension    Hip abduction 4/5 3+/5  Hip adduction 5/5 3/5  Hip internal rotation    Hip external rotation    Knee flexion 5/5   Knee extension 5/5   Ankle dorsiflexion 5/5   Ankle plantarflexion    Ankle inversion    Ankle eversion    (Blank rows = not tested)  BED MOBILITY:  Sit to supine Complete Independence Supine to sit Complete Independence Rolling to Right Complete Independence Rolling to Left Complete Independence  TRANSFERS: Sit to stand: Complete Independence Stand to sit: Complete Independence Floor transfers: daughter states family assisted when pt fell to floor  GAIT: Gait pattern: step to pattern, decreased hip/knee flexion- Left, and trunk flexed Distance walked: 405' + various clinic distances Assistive device utilized:  standard no wheel walker Level of assistance: SBA and CGA Gait velocity: 1.18 ft/sec Comments: Pt traverses too far forward into walker w/ intermittent misplacement of prosthetic causing toe catch x2.  He advances standard walker too far forward during each stride resulting in large step-to stride pattern.  FUNCTIONAL TESTS:  5 times sit to stand: 16.45 seconds w/ BUE support-transfers UE support to RW, needed restart x1 due to instability initially 6 minute walk test: 405' w/ standard walker 10 meter walk test: 28.07 seconds w/ standard walker = 0.36 m/sec OR 1.18 ft/sec Berg Balance Scale: To be  assessed.  CURRENT PROSTHETIC WEAR ASSESSMENT: Patient is independent with: residual limb care, care of non-amputated limb, prosthetic cleaning, and ply sock cleaning Patient is dependent with: skin check, correct ply sock adjustment, proper wear schedule/adjustment, and proper weight-bearing schedule/adjustment Donning prosthesis: Complete Independence Doffing prosthesis: Complete Independence Prosthetic wear tolerance: 8 hours inconsistently/day, 7 days/week  Prosthetic weight bearing tolerance: 1 hour Edema: no visible swelling on eval, has stopped wearing shrinker. Residual limb condition: Sweat/moist, clean, well-shaped, mild invagination of scar that is well healed, less hair than proximal leg, warm. Prosthetic description:  ischial containment suction socket w/ flexible inner liner w/ polycentric 4-bar knee w/ pneumatic swing control and fixed ankle SACH foot K code/activity level with prosthetic use: Level 3   TODAY'S TREATMENT:      TherAct:  Assessed in sitting at beginning of session: Vitals:   08/06/22 1407  BP: (!) 168/64  Pulse: 69   Discussed walking program using RW at current (provided copy in spanish): You Can Walk For A Certain Length Of Time Each Day                          Walk 10 minutes 1 times per day.             Increase 2  minutes every 7 days              Work up to 20 minutes continuously (1 times per day).               Example:                         Day 1-2           4-5 minutes     3 times per day                         Day 7-8           10-12 minutes 2-3 times per day                         Day 13-14       20-22 minutes 1-2 times per day  Gait: GAIT: Gait pattern: step to pattern, decreased arm swing- Left, decreased step length- Right, decreased stance time- Left, decreased stride length, decreased hip/knee flexion-  Left, lateral lean- Left, and abducted- Left Distance walked: 200' + 200' + 200' Assistive device utilized: Retail banker - 2 wheeled Level of assistance: CGA and Min A Comments:   First bout of gait with RW to assess mechanics of gait in socket.  Second and third performed w/ NBQC.  Pt does not circumduct and has adequate hip flexion, what PT is noticing today as pt is deep enough in socket and without pistoning is that he is doing hip flexion and abduction simultaneously contributing to an extended feeling of the leg when he is in stance resulting in some left lateral lean.  Single instance of prosthetic knee buckle w/ minA to support pt while he corrected position of knee.  Cues provided to improve cane contact to ground and decrease speed of ambulation to prevent LOB due to momentum.  During third bout of gait patient has significant LOB due to inadequate extension of prosthetic knee placement resulting in buckle and modA of 2 to recover to standing-pt denies seated rest and continues walking.  Pt reports being nervous following task, but denies fatigue.  He states he has difficulty bringing the prosthetic into normal BOS even when using visual floor cues as instructed prior to LOB.  He would benefit from further balance and ambulatory practice with normal BOS vs tendency to abduct residual limb.   -Several rounds of marching performed w/ CGA using RW for UE support, return demo and extensive cuing for upright posture, hip flexor engagement, visual target to improve hip ROM and adduction target vs allowing abduction of LLE.  Pt uses straddle foot position so time used to facilitate and correct foot placement and eliminate hip hike.  PATIENT EDUCATION: Education details: Continue HEP and walking program.  Safety education to discourage pt from continuing to use NBQC and  contralateral UE to furniture walk due to instability noted today.   Person educated: Patient and  Child(ren) Education method: Explanation, Demonstration, and Handouts Education comprehension: verbalized understanding and needs further education  HOME EXERCISE PROGRAM: Access Code: [email or web address] URL: [email or web address] Date: 07/15/2022 Prepared by: Peter Congo  Exercises - Prone Hip Flexor Stretch with Towel Roll (AKA)  - 1 x daily - 7 x weekly - 1 sets - 1 reps - 10 minutes hold - Standing Hip Flexion March  - 1 x daily - 7 x weekly - 3 sets - 10 reps - Standing Hip Abduction  - 1 x daily - 7 x weekly - 3 sets - 10 reps - Standing Hip Extension with Chair  - 1 x daily - 7 x weekly - 3 sets - 10 reps  AMPUTEE SINK HEP (Provided to pt and daughter in [place] and spanish 5/16) Haz cada ejercicio 1-2 veces al da. Haz cada ejercicio 10 repeticiones. Mantenga cada ejercicio durante 2 segundos para sentir su ubicacin.  EN EL FREGADERO ENCUENTRE SU POSICIN EN LA LNEA MEDIA Y COLOQUE LOS PIES A LA IGUAL DISTANCIA DE LA LNEA MEDIA. Trate de encontrar esta posicin cuando est quieto para Arts development officer.  UTILICE CINTA EN EL PISO PARA MARCAR LA POSICIN DE LA LNEA MEDIA. Tambin debe intentar sentir con la extremidad la presin en la cavidad. Ests tratando de sentir con las extremidades lo que solas sentir con la planta del pie.  1. Desplazamiento de lado a lado: moviendo solo las caderas (no los hombros): [employer] el peso hacia la pierna izquierda, MANTENER/SENTIR. Vuelva a colocar el mismo peso en cada pierna, MANTENER/SENTIR. Mueva el peso sobre su pierna derecha, MANTENER/SENTIR. Vuelva a colocar el mismo peso en cada pierna, MANTENER/SENTIR. Repetir. Comience con ambas manos en el lavabo, avance solo hacia la derecha y luego sin manos. 2. Cambio de adelante hacia atrs: moviendo solo las caderas (no los hombros): [employer] el peso hacia adelante [employer] dedos de los pies, MANTENER/SENTIR. Mueva su peso hacia atrs para igualar el pie plano en ambas piernas,  MANTENER/SENTIR. Mueva su peso nuevamente sobre sus talones, MANTENER/SENTIR. Mueva su peso hacia atrs para igualarlo en ambas piernas, MANTENER/SENTIR. Repetir. comience con ambas manos en el fregadero, avance solo [employer] mano derecha, luego sin [place]. 3. Conos/copas en movimiento: Con el mismo peso en cada pierna: [place]jt[place] con una mano la primera vez, luego avance hasta no apoyar las manos. Mueva las tazas de un lado del fregadero al otro. Coloque los vasos a ~2" fuera de su alcance, avance hasta 10" ms all de su alcance. Coloque una mano en el medio del fregadero y extienda la mano con la otra. Haz ambos brazos. Luego coloque una mano y [place] las tazas con la otra. 4. Alcanzar hacia arriba/hacia Tomasita Crumble: se alterna el alcance hasta los gabinetes superiores o el techo si no hay gabinetes presentes. Mantenga el mismo peso en cada pierna. Comience con Edison Simon apoyada en el mostrador mientras la otra mano se extiende y avance hasta no apoyar la mano al [place]. Coloque una mano en el medio del fregadero y extindala con la [place]. Haz ambos brazos. Luego coloque una mano y [place] las tazas con la otra. 5. Mirando por encima de los hombros: Con el mismo peso en cada pierna: gire alternativamente para mirar por encima de los hombros con una mano apoyada en el mostrador segn sea necesario. Comience con movimientos de la [place] solo para mirar delante del hombro, luego incluso con  el hombro y [address] mirar detrs de usted. Para mirar hacia un lado, mueva la cabeza/los ojos, luego el hombro del costado que mira [place], cambie ms peso hacia el costado y tire de la cadera Risk manager. Coloca una mano en el medio del fregadero y suelta la otra para que tu hombro pueda retroceder. Cambia de mano para mirar hacia otro lado. Luego coloque una mano y [place] las tazas con la otra. 6. Al pisar con una pierna que no est amputada: retire de su camino los artculos que se encuentran debajo del gabinete. Mueva  las caderas/pelvis para que el peso recaiga sobre la prtesis. PASE [place]ENT[place] la otra pierna de modo que la parte delantera del pie quede dentro del gabinete. Luego retroceda al piso.   Walking Program Using RW  (Por favor use su andador.): [employer] caminar durante un cierto perodo de Pharmacist, community                          Camine 10 minutos 1 vez al da.             Aumentar 2 minutos cada 7 das              Trabaje hasta 20 minutos seguidos (1 vez al da).               Ejemplo:                         Da 1-2           4-5 minutos     3 veces al da                         Da 7-8           10-12 minutos 2-3 veces al da                         Da 13-14       20-22 minutos 1-2 veces al da  ASSESSMENT:  CLINICAL IMPRESSION: Focus of skilled session today on addressing mechanics of LLE with therapeutic exercise and dynamic gait progressing to NBQC.  Pt is not appropriate for use of NBQC at home as of current due to instability still noted in session today.  Pt needs continued work on self-monitoring fatigue as he currently pushes through when therapist advises rest due to breakdown of mechanics.  Will continue to progress mobility as is safe to do so and recommending use of RW at current.  OBJECTIVE IMPAIRMENTS: Abnormal gait, decreased activity tolerance, decreased balance, decreased coordination, decreased endurance, decreased knowledge of use of DME, decreased mobility, difficulty walking, decreased ROM, decreased strength, decreased safety awareness, increased edema, improper body mechanics, and prosthetic dependency .   ACTIVITY LIMITATIONS: carrying, lifting, bending, standing, squatting, stairs, transfers, locomotion level, and caring for others  PARTICIPATION LIMITATIONS: meal prep, cleaning, laundry, driving, shopping, community activity, and yard work  PERSONAL FACTORS: Age, Education, Fitness, Past/current experiences, Transportation, and 3+ comorbidities: HTN, CKD, DM2, and  prostate cancer w/ ongoing radiation  are also affecting patient's functional outcome.   REHAB POTENTIAL: Good  CLINICAL DECISION MAKING: Evolving/moderate complexity  EVALUATION COMPLEXITY: Moderate   GOALS: Goals reviewed with patient? Yes  SHORT TERM GOALS: Target date: 08/09/2022  Pt will be independent and compliant with initial strength and balance HEP to improve functional  mobility and prosthetic management. Baseline:  To be established. Goal status: INITIAL  2.  Pt will decrease 5xSTS to </=13 seconds w/o UE support in order to demonstrate decreased risk for falls and improved functional bilateral LE strength and power. Baseline: 16.45 sec w/ BUE support Goal status: INITIAL  3.  Pt will ambulate >/=605 feet on to demonstrate improved functional endurance for home and community participation. Baseline:  405' continuously w/ standard walker Goal status: INITIAL  4.  Pt will demonstrate a gait speed of >/=1.48 feet/sec in order to decrease risk for falls. Baseline: 1.18 ft/sec w/ standard walker Goal status: INITIAL  5.  Pt will improve Berg score to 29/56 for decreased fall risk Baseline:  24/56 (5/6) Goal status: INITIAL  LONG TERM GOALS: Target date: 09/06/2022  Patient to maintain established walking program x3 days per week to improve cardiovascular tolerance and functional mobility w/ prosthesis and LRAD as needed. Baseline:  To be established. Goal status: INITIAL  2.  Pt will ambulate >/=805 feet on to demonstrate improved functional endurance for home and community participation. Baseline: 405' continuously w/ standard walker Goal status: INITIAL  3.  Pt will demonstrate a gait speed of >/=1.78 feet/sec in order to decrease risk for falls. Baseline: 1.18 ft/sec w/ standard walker Goal status: INITIAL  4.  Pt will improve Berg score to 34/56 for decreased fall risk Baseline:  24/56 (5/6) Goal status: INITIAL  5.  Pt will ambulate >/=250 feet  over level surfaces using quad cane for improved access to home environment. Baseline: To be initiated. Goal status: INITIAL  6.  Pt will navigate 4 stairs SBA using single rail and cane as necessary to improve entry to home environment. Baseline:  To be assessed. Goal status: INITIAL   PLAN:  PT FREQUENCY: 2x/week  PT DURATION: 8 weeks  PLANNED INTERVENTIONS: Therapeutic exercises, Therapeutic activity, Neuromuscular re-education, Balance training, Gait training, Patient/Family education, Self Care, Joint mobilization, Stair training, Vestibular training, Prosthetic training, DME instructions, Manual therapy, and Re-evaluation  PLAN FOR NEXT SESSION: Assess left BP-did he take meds?  functional hip strengthening-hip flexion to prevent LLE hip abduction especially when walking, walking program-translate to [place].  Gait training w/ walker vs 4-prong cane.  Working towards quad cane for level ground and stairs.  Sadie Haber, PT, DPT 08/06/2022, 2:55 PM

## 2022-08-08 ENCOUNTER — Ambulatory Visit: Payer: 59 | Admitting: Physical Therapy

## 2022-08-08 VITALS — BP 155/66 | HR 65

## 2022-08-08 DIAGNOSIS — R2689 Other abnormalities of gait and mobility: Secondary | ICD-10-CM | POA: Diagnosis not present

## 2022-08-08 DIAGNOSIS — R2681 Unsteadiness on feet: Secondary | ICD-10-CM

## 2022-08-08 DIAGNOSIS — M6281 Muscle weakness (generalized): Secondary | ICD-10-CM

## 2022-08-08 DIAGNOSIS — Z89612 Acquired absence of left leg above knee: Secondary | ICD-10-CM | POA: Diagnosis not present

## 2022-08-08 NOTE — Therapy (Signed)
OUTPATIENT PHYSICAL THERAPY PROSTHETICS TREATMENT   Patient Name: Mckennon Mange MRN: [id number] DOB:02-05-1956, 67 y.o., male Today's Date: 08/08/2022  PCP: Filomena Jungling, NP REFERRING PROVIDER: Nadara Mustard, MD  END OF SESSION:  PT End of Session - 08/08/22 1538     Visit Number 8    Number of Visits 17   16 + eval   Date for PT Re-Evaluation 09/13/22   pushed out due to frequency and scheduling conflicts w/ radiation therapy   Authorization Type [employer]ICARE    Progress Note Due on Visit 10    PT Start Time 1537   pt arrived late   PT Stop Time 1615    PT Time Calculation (min) 38 min    Equipment Utilized During Treatment Gait belt    Activity Tolerance Patient tolerated treatment well    Behavior During Therapy WFL for tasks assessed/performed                Past Medical History:  Diagnosis Date   Diabetes mellitus without complication (HCC)    Hypertension    Past Surgical History:  Procedure Laterality Date   AMPUTATION Left 04/17/2022   Procedure: LEFT ABOVE KNEE AMPUTATION;  Surgeon: Nadara Mustard, MD;  Location: [facility];  Service: Orthopedics;  Laterality: Left;   MASS EXCISION Left 04/17/2021   Procedure: EXCISION LEFT ELBOW MASS;  Surgeon: Allena Napoleon, MD;  Location: [facility];  Service: Plastics;  Laterality: Left;  1 hour   MASS EXCISION Left 08/10/2021   Procedure: EXCISION ELBOW MASS;  Surgeon: Allena Napoleon, MD;  Location:  SURGERY CENTER;  Service: Plastics;  Laterality: Left;  1 hour   Patient Active Problem List   Diagnosis Date Noted   Diabetic infection of left foot (HCC) 04/17/2022   Necrotizing fasciitis of lower leg (HCC) 04/17/2022   Dyslipidemia 04/17/2022   Essential hypertension 01/12/2021   Obesity 01/12/2021   Skin sensation disturbance 01/12/2021   Type 2 diabetes mellitus without complication (HCC) 01/12/2021   Diabetic neuropathy (HCC) 01/12/2021   Hav (hallux abducto valgus),  unspecified laterality 01/12/2021   Cough 10/31/2017   Elevated blood pressure reading 10/31/2017    ONSET DATE: 04/17/2022 (date of amputation)  REFERRING DIAG: W09.811 (ICD-10-CM) - S/P above knee amputation, left (HCC)  THERAPY DIAG:  Other abnormalities of gait and mobility  Muscle weakness (generalized)  Unsteadiness on feet  Rationale for Evaluation and Treatment: Rehabilitation  SUBJECTIVE:   SUBJECTIVE STATEMENT: Pt went to see [facility] earlier today, they said that the length of his prosthetic limb was good but his socket was too loose. His prosthetist put some padding in his socket to snug up fit of it and encouraged him to wear residual limb socks as his limb will shrink the longer he is walking on it.  Pt accompanied by: family member-daughter [name]; Spanish-language interpreter Alexander  PERTINENT HISTORY: HTN, DM2, CKD, prostate cancer undergoing radiation treatment and androgen deprivation therapy  PAIN:  Are you having pain? No  PRECAUTIONS: Fall; pt is having radiation therapy M-F in the mornings  WEIGHT BEARING RESTRICTIONS: No  FALLS: Has patient fallen in last 6 months? Yes. Number of falls 2-his did not have prosthetic donned, thought his foot was there and tried to step and went down  LIVING ENVIRONMENT: Lives with: lives with their family and lives with their spouse-2 adult children, 1 son-in-law, 3 infant children Lives in: House/apartment Home Access: Stairs to enter Home layout: One level  Stairs: Yes: External: 4 steps; bilateral but cannot reach both Has following equipment at home: Dan Humphreys - 2 wheeled, Wheelchair (manual), and shower chair  PLOF: Independent with household mobility with device, Independent with transfers, Requires assistive device for independence, and Needs assistance with homemaking  PATIENT GOALS: "To walk better and learn to use the prosthetic."  OBJECTIVE:   DIAGNOSTIC FINDINGS: No recent relevant  findings.  COGNITION: Overall cognitive status: Within functional limits for tasks assessed   SENSATION: Light touch: WFL  POSTURE: No Significant postural limitations; pt has right mass superior to olecranon (per MD notes related to gout)  LOWER EXTREMITY ROM:  Active ROM Right eval Left eval  Hip flexion WNL WFL  Hip extension    Hip abduction    Hip adduction    Hip internal rotation    Hip external rotation    Knee flexion    Knee extension    Ankle dorsiflexion    Ankle plantarflexion    Ankle inversion    Ankle eversion     (Blank rows = not tested)  LOWER EXTREMITY MMT:  MMT Right eval Left eval  Hip flexion 4+/5 4/5  Hip extension    Hip abduction 4/5 3+/5  Hip adduction 5/5 3/5  Hip internal rotation    Hip external rotation    Knee flexion 5/5   Knee extension 5/5   Ankle dorsiflexion 5/5   Ankle plantarflexion    Ankle inversion    Ankle eversion    (Blank rows = not tested)  BED MOBILITY:  Sit to supine Complete Independence Supine to sit Complete Independence Rolling to Right Complete Independence Rolling to Left Complete Independence  TRANSFERS: Sit to stand: Complete Independence Stand to sit: Complete Independence Floor transfers: daughter states family assisted when pt fell to floor  GAIT: Gait pattern: step to pattern, decreased hip/knee flexion- Left, and trunk flexed Distance walked: 405' + various clinic distances Assistive device utilized:  standard no wheel walker Level of assistance: SBA and CGA Gait velocity: 1.18 ft/sec Comments: Pt traverses too far forward into walker w/ intermittent misplacement of prosthetic causing toe catch x2.  He advances standard walker too far forward during each stride resulting in large step-to stride pattern.  FUNCTIONAL TESTS:  5 times sit to stand: 16.45 seconds w/ BUE support-transfers UE support to RW, needed restart x1 due to instability initially 6 minute walk test: 405' w/ standard  walker 10 meter walk test: 28.07 seconds w/ standard walker = 0.36 m/sec OR 1.18 ft/sec Berg Balance Scale: To be assessed.  CURRENT PROSTHETIC WEAR ASSESSMENT: Patient is independent with: residual limb care, care of non-amputated limb, prosthetic cleaning, and ply sock cleaning Patient is dependent with: skin check, correct ply sock adjustment, proper wear schedule/adjustment, and proper weight-bearing schedule/adjustment Donning prosthesis: Complete Independence Doffing prosthesis: Complete Independence Prosthetic wear tolerance: 8 hours inconsistently/day, 7 days/week  Prosthetic weight bearing tolerance: 1 hour Edema: no visible swelling on eval, has stopped wearing shrinker. Residual limb condition: Sweat/moist, clean, well-shaped, mild invagination of scar that is well healed, less hair than proximal leg, warm. Prosthetic description:  ischial containment suction socket w/ flexible inner liner w/ polycentric 4-bar knee w/ pneumatic swing control and fixed ankle SACH foot K code/activity level with prosthetic use: Level 3   TODAY'S TREATMENT:      TherAct:  Assessed in sitting at beginning of session: Vitals:   08/08/22 1541  BP: (!) 155/66  Pulse: 65   For STG assessment:  [facility] PT Assessment - 08/08/22 1545       Ambulation/Gait   Gait velocity 32.8 ft over 16.58 sec = 1.98 ft/sec      6 minute walk test results    Aerobic Endurance Distance Walked 600    Endurance additional comments RPE 5/10      Standardized Balance Assessment   Standardized Balance Assessment Five Times Sit to Stand    Five times sit to stand comments  19.62      Berg Balance Test   Sit to Stand Able to stand without using hands and stabilize independently    Standing Unsupported Able to stand 2 minutes with supervision    Sitting with Back Unsupported but Feet Supported  on Floor or Stool Able to sit safely and securely 2 minutes    Stand to Sit Sits safely with minimal use of hands    Transfers Able to transfer safely, definite need of hands    Standing Unsupported with Eyes Closed Able to stand 10 seconds with supervision    Standing Unsupported with Feet Together Able to place feet together independently and stand for 1 minute with supervision    From Standing, Reach Forward with Outstretched Arm Reaches forward but needs supervision    From Standing Position, Pick up Object from Floor Unable to try/needs assist to keep balance    From Standing Position, Turn to Look Behind Over each Shoulder Turn sideways only but maintains balance    Turn 360 Degrees Needs assistance while turning    Standing Unsupported, Alternately Place Feet on Step/Stool Needs assistance to keep from falling or unable to try    Standing Unsupported, One Foot in Front Needs help to step but can hold 15 seconds    Standing on One Leg Able to lift leg independently and hold equal to or more than 3 seconds    Total Score 30    Berg comment: 30/56, high fall risk               PATIENT EDUCATION: Education details: Continue HEP and walking program, results of OM and functional implications Person educated: Patient and Child(ren) Education method: Explanation Education comprehension: verbalized understanding and needs further education  HOME EXERCISE PROGRAM: Access Code: [email or web address] URL: [email or web address] Date: 07/15/2022 Prepared by: Peter Congo  Exercises - Prone Hip Flexor Stretch with Towel Roll (AKA)  - 1 x daily - 7 x weekly - 1 sets - 1 reps - 10 minutes hold - Standing Hip Flexion March  - 1 x daily - 7 x weekly - 3 sets - 10 reps - Standing Hip Abduction  - 1 x daily - 7 x weekly - 3 sets - 10 reps - Standing Hip Extension with Chair  - 1 x daily - 7 x weekly - 3 sets - 10 reps  AMPUTEE SINK HEP (Provided to pt and daughter in [place] and spanish  5/16) Haz cada ejercicio 1-2 veces al da. Haz cada ejercicio 10 repeticiones. Mantenga cada ejercicio durante 2 segundos para sentir su ubicacin.  EN EL FREGADERO ENCUENTRE SU POSICIN EN LA LNEA MEDIA Y COLOQUE LOS PIES A LA IGUAL DISTANCIA DE LA LNEA MEDIA. Trate de encontrar esta posicin cuando est quieto para Arts development officer.  UTILICE CINTA EN EL PISO PARA MARCAR LA POSICIN DE LA LNEA MEDIA. Tambin debe intentar sentir con la  extremidad la presin en la cavidad. Ests tratando de sentir con las extremidades lo que solas sentir con la planta del pie.  1. Desplazamiento de lado a lado: moviendo solo las caderas (no los hombros): [employer] el peso hacia la pierna izquierda, MANTENER/SENTIR. Vuelva a colocar el mismo peso en cada pierna, MANTENER/SENTIR. Mueva el peso sobre su pierna derecha, MANTENER/SENTIR. Vuelva a colocar el mismo peso en cada pierna, MANTENER/SENTIR. Repetir. Comience con ambas manos en el lavabo, avance solo hacia la derecha y luego sin manos. 2. Cambio de adelante hacia atrs: moviendo solo las caderas (no los hombros): [employer] el peso hacia adelante [employer] dedos de los pies, MANTENER/SENTIR. Mueva su peso hacia atrs para igualar el pie plano en ambas piernas, MANTENER/SENTIR. Mueva su peso nuevamente sobre sus talones, MANTENER/SENTIR. Mueva su peso hacia atrs para igualarlo en ambas piernas, MANTENER/SENTIR. Repetir. comience con ambas manos en el fregadero, avance solo [employer] mano derecha, luego sin [place]. 3. Conos/copas en movimiento: Con el mismo peso en cada pierna: Sujtese con una mano la primera vez, luego avance hasta no apoyar las manos. Mueva las tazas de un lado del fregadero al otro. Coloque los vasos a ~2" fuera de su alcance, avance hasta 10" ms all de su alcance. Coloque una mano en el medio del fregadero y extienda la mano con la otra. Haz ambos brazos. Luego coloque una mano y [place] las tazas con la otra. 4. Alcanzar hacia arriba/hacia Tomasita Crumble:  se alterna el alcance hasta los gabinetes superiores o el techo si no hay gabinetes presentes. Mantenga el mismo peso en cada pierna. Comience con Edison Simon apoyada en el mostrador mientras la otra mano se extiende y avance hasta no apoyar la mano al [place]. Coloque una mano en el medio del fregadero y extindala con la [place]. Haz ambos brazos. Luego coloque una mano y [place] las tazas con la otra. 5. Mirando por encima de los hombros: Con el mismo peso en cada pierna: gire alternativamente para mirar por encima de los hombros con una mano apoyada en el mostrador segn sea necesario. Comience con movimientos de la [place] solo para mirar delante del hombro, luego incluso con el hombro y Programme researcher, broadcasting/film/video mirar detrs de usted. Para mirar hacia un lado, mueva la cabeza/los ojos, luego el hombro del costado que mira [place], cambie ms peso hacia el costado y tire de la cadera Risk manager. Coloca una mano en el medio del fregadero y suelta la otra para que tu hombro pueda retroceder. Cambia de mano para mirar hacia otro lado. Luego coloque una mano y [place] las tazas con la otra. 6. Al pisar con una pierna que no est amputada: retire de su camino los artculos que se encuentran debajo del gabinete. Mueva las caderas/pelvis para que el peso recaiga sobre la prtesis. PASE [place] la otra pierna de modo que la parte delantera del pie quede dentro del gabinete. Luego retroceda al piso.   Walking Program Using RW  (Por favor use su andador.): [employer] caminar durante un cierto perodo de Pharmacist, community                          Camine 10 minutos 1 vez al da.             Aumentar 2 minutos cada 7 das              Trabaje hasta 20 minutos seguidos (1 vez al da).  Ejemplo:                         Da 1-2           4-5 minutos     3 veces al da                         Da 7-8           10-12 minutos 2-3 veces al da                         Da 13-14       20-22 minutos 1-2 veces al  da  ASSESSMENT:  CLINICAL IMPRESSION: Emphasis of skilled PT session on assessing STG and reviewing results of his outcome measures. Pt has met 3/5 goals and is making progress towards 5/5 goals due to being independent with his initial HEP and walking program, improving his score on the to 600 ft (did not quite meet goal of 605 ft), increasing his gait speed to 1.98 ft/sec, improving his score on the Berg to 30/56, and improving his time on the 5xSTS score to 14.63 sec (did not quite meet goal of 13 sec). Overall pt does exhibit improved balance, improved functional LE strength, improved endurance, and decreased fall risk since his initial evaluation. Pt continues to benefit from skilled therapy services to work towards his LTGs. Continue POC.   OBJECTIVE IMPAIRMENTS: Abnormal gait, decreased activity tolerance, decreased balance, decreased coordination, decreased endurance, decreased knowledge of use of DME, decreased mobility, difficulty walking, decreased ROM, decreased strength, decreased safety awareness, increased edema, improper body mechanics, and prosthetic dependency .   ACTIVITY LIMITATIONS: carrying, lifting, bending, standing, squatting, stairs, transfers, locomotion level, and caring for others  PARTICIPATION LIMITATIONS: meal prep, cleaning, laundry, driving, shopping, community activity, and yard work  PERSONAL FACTORS: Age, Education, Fitness, Past/current experiences, Transportation, and 3+ comorbidities: HTN, CKD, DM2, and prostate cancer w/ ongoing radiation  are also affecting patient's functional outcome.   REHAB POTENTIAL: Good  CLINICAL DECISION MAKING: Evolving/moderate complexity  EVALUATION COMPLEXITY: Moderate   GOALS: Goals reviewed with patient? Yes  SHORT TERM GOALS: Target date: 08/09/2022  Pt will be independent and compliant with initial strength and balance HEP to improve functional mobility and prosthetic management. Baseline:  To be  established. Goal status: MET  2.  Pt will decrease 5xSTS to </=13 seconds w/o UE support in order to demonstrate decreased risk for falls and improved functional bilateral LE strength and power. Baseline: 16.45 sec w/ BUE support, 14.63 sec with one UE support (5/30) Goal status: IN PROGRESS  3.  Pt will ambulate >/=605 feet on to demonstrate improved functional endurance for home and community participation. Baseline:  405' continuously w/ standard walker, 600 ft with RW (5/30) Goal status: IN PROGRESS  4.  Pt will demonstrate a gait speed of >/=1.48 feet/sec in order to decrease risk for falls. Baseline: 1.18 ft/sec w/ standard walker, 1.98 ft/sec with RW (5/30) Goal status: MET  5.  Pt will improve Berg score to 29/56 for decreased fall risk Baseline:  24/56 (5/6), 30/56 (5/30) Goal status: MET  LONG TERM GOALS: Target date: 09/06/2022  Patient to maintain established walking program x3 days per week to improve cardiovascular tolerance and functional mobility w/ prosthesis and LRAD as needed. Baseline:  To be established. Goal status: INITIAL  2.  Pt will ambulate >/=805  feet on to demonstrate improved functional endurance for home and community participation. Baseline: 405' continuously w/ standard walker, 600 ft with RW (5/30) Goal status: INITIAL  3.  Pt will demonstrate a gait speed of >/=2.0 feet/sec in order to decrease risk for falls. Baseline: 1.18 ft/sec w/ standard walker, 1.98 ft/sec with RW (5/30) Goal status: REVISED/UPGRADED  4.  Pt will improve Berg score to 34/56 for decreased fall risk Baseline:  24/56 (5/6), 30/56 (5/30) Goal status: INITIAL  5.  Pt will ambulate >/=250 feet over level surfaces using quad cane for improved access to home environment. Baseline: To be initiated. Goal status: INITIAL  6.  Pt will navigate 4 stairs SBA using single rail and cane as necessary to improve entry to home environment. Baseline:  To be assessed. Goal  status: INITIAL   PLAN:  PT FREQUENCY: 2x/week  PT DURATION: 8 weeks  PLANNED INTERVENTIONS: Therapeutic exercises, Therapeutic activity, Neuromuscular re-education, Balance training, Gait training, Patient/Family education, Self Care, Joint mobilization, Stair training, Vestibular training, Prosthetic training, DME instructions, Manual therapy, and Re-evaluation  PLAN FOR NEXT SESSION: Assess left BP-did he take meds?  functional hip strengthening-hip flexion to prevent LLE hip abduction especially when walking, walking program-translate to [place].  Gait training w/ walker vs 4-prong cane.  Working towards quad cane for level ground and stairs., stairs with handrails first  Peter Congo, PT, DPT, CSRS 08/08/2022, 4:17 PM

## 2022-08-12 ENCOUNTER — Ambulatory Visit: Payer: 59 | Attending: Orthopedic Surgery | Admitting: Physical Therapy

## 2022-08-12 DIAGNOSIS — R2689 Other abnormalities of gait and mobility: Secondary | ICD-10-CM | POA: Insufficient documentation

## 2022-08-12 DIAGNOSIS — R2681 Unsteadiness on feet: Secondary | ICD-10-CM | POA: Insufficient documentation

## 2022-08-12 DIAGNOSIS — M6281 Muscle weakness (generalized): Secondary | ICD-10-CM | POA: Insufficient documentation

## 2022-08-12 NOTE — Therapy (Signed)
OUTPATIENT PHYSICAL THERAPY PROSTHETICS TREATMENT   Patient Name: Tim Vincent MRN: [id number] DOB:1955/04/24, 67 y.o., male Today's Date: 08/12/2022  PCP: Filomena Jungling, NP REFERRING PROVIDER: Nadara Mustard, MD  END OF SESSION:  PT End of Session - 08/12/22 1329     Visit Number 9    Number of Visits 17   16 + eval   Date for PT Re-Evaluation 09/13/22   pushed out due to frequency and scheduling conflicts w/ radiation therapy   Authorization Type [employer]ICARE    Progress Note Due on Visit 10    PT Start Time 1327   pt arrived late   PT Stop Time 1400    PT Time Calculation (min) 33 min    Equipment Utilized During Treatment Gait belt    Activity Tolerance Patient tolerated treatment well    Behavior During Therapy WFL for tasks assessed/performed                 Past Medical History:  Diagnosis Date   Diabetes mellitus without complication (HCC)    Hypertension    Past Surgical History:  Procedure Laterality Date   AMPUTATION Left 04/17/2022   Procedure: LEFT ABOVE KNEE AMPUTATION;  Surgeon: Nadara Mustard, MD;  Location: [facility];  Service: Orthopedics;  Laterality: Left;   MASS EXCISION Left 04/17/2021   Procedure: EXCISION LEFT ELBOW MASS;  Surgeon: Allena Napoleon, MD;  Location: [facility];  Service: Plastics;  Laterality: Left;  1 hour   MASS EXCISION Left 08/10/2021   Procedure: EXCISION ELBOW MASS;  Surgeon: Allena Napoleon, MD;  Location: [facility];  Service: Plastics;  Laterality: Left;  1 hour   Patient Active Problem List   Diagnosis Date Noted   Diabetic infection of left foot (HCC) 04/17/2022   Necrotizing fasciitis of lower leg (HCC) 04/17/2022   Dyslipidemia 04/17/2022   Essential hypertension 01/12/2021   Obesity 01/12/2021   Skin sensation disturbance 01/12/2021   Type 2 diabetes mellitus without complication (HCC) 01/12/2021   Diabetic neuropathy (HCC) 01/12/2021   Hav (hallux abducto valgus),  unspecified laterality 01/12/2021   Cough 10/31/2017   Elevated blood pressure reading 10/31/2017    ONSET DATE: 04/17/2022 (date of amputation)  REFERRING DIAG: W09.811 (ICD-10-CM) - S/P above knee amputation, left (HCC)  THERAPY DIAG:  Other abnormalities of gait and mobility  Muscle weakness (generalized)  Unsteadiness on feet  Rationale for Evaluation and Treatment: Rehabilitation  SUBJECTIVE:   SUBJECTIVE STATEMENT: Pt reports no acute changes since last visit. Pt reports his limb is fitting well, hasn't needed to wear socks yet.  Pt accompanied by: family Doctor, hospital; Spanish-language interpreter Anjie  PERTINENT HISTORY: HTN, DM2, CKD, prostate cancer undergoing radiation treatment and androgen deprivation therapy  PAIN:  Are you having pain? No  PRECAUTIONS: Fall; pt is having radiation therapy M-F in the mornings  WEIGHT BEARING RESTRICTIONS: No  FALLS: Has patient fallen in last 6 months? Yes. Number of falls 2-his did not have prosthetic donned, thought his foot was there and tried to step and went down  LIVING ENVIRONMENT: Lives with: lives with their family and lives with their spouse-2 adult children, 1 son-in-law, 3 infant children Lives in: House/apartment Home Access: Stairs to enter Home layout: One level Stairs: Yes: External: 4 steps; bilateral but cannot reach both Has following equipment at home: Dan Humphreys - 2 wheeled, Wheelchair (manual), and shower chair  PLOF: Independent with household mobility with device, Independent with transfers, Requires  assistive device for independence, and Needs assistance with homemaking  PATIENT GOALS: "To walk better and learn to use the prosthetic."  OBJECTIVE:   DIAGNOSTIC FINDINGS: No recent relevant findings.  COGNITION: Overall cognitive status: Within functional limits for tasks assessed   SENSATION: Light touch: WFL  POSTURE: No Significant postural limitations; pt has right mass superior to  olecranon (per MD notes related to gout)  LOWER EXTREMITY ROM:  Active ROM Right eval Left eval  Hip flexion WNL WFL  Hip extension    Hip abduction    Hip adduction    Hip internal rotation    Hip external rotation    Knee flexion    Knee extension    Ankle dorsiflexion    Ankle plantarflexion    Ankle inversion    Ankle eversion     (Blank rows = not tested)  LOWER EXTREMITY MMT:  MMT Right eval Left eval  Hip flexion 4+/5 4/5  Hip extension    Hip abduction 4/5 3+/5  Hip adduction 5/5 3/5  Hip internal rotation    Hip external rotation    Knee flexion 5/5   Knee extension 5/5   Ankle dorsiflexion 5/5   Ankle plantarflexion    Ankle inversion    Ankle eversion    (Blank rows = not tested)  BED MOBILITY:  Sit to supine Complete Independence Supine to sit Complete Independence Rolling to Right Complete Independence Rolling to Left Complete Independence  TRANSFERS: Sit to stand: Complete Independence Stand to sit: Complete Independence Floor transfers: daughter states family assisted when pt fell to floor  GAIT: Gait pattern: step to pattern, decreased hip/knee flexion- Left, and trunk flexed Distance walked: 405' + various clinic distances Assistive device utilized:  standard no wheel walker Level of assistance: SBA and CGA Gait velocity: 1.18 ft/sec Comments: Pt traverses too far forward into walker w/ intermittent misplacement of prosthetic causing toe catch x2.  He advances standard walker too far forward during each stride resulting in large step-to stride pattern.  FUNCTIONAL TESTS:  5 times sit to stand: 16.45 seconds w/ BUE support-transfers UE support to RW, needed restart x1 due to instability initially 6 minute walk test: 405' w/ standard walker 10 meter walk test: 28.07 seconds w/ standard walker = 0.36 m/sec OR 1.18 ft/sec Berg Balance Scale: To be assessed.  CURRENT PROSTHETIC WEAR ASSESSMENT: Patient is independent with: residual limb  care, care of non-amputated limb, prosthetic cleaning, and ply sock cleaning Patient is dependent with: skin check, correct ply sock adjustment, proper wear schedule/adjustment, and proper weight-bearing schedule/adjustment Donning prosthesis: Complete Independence Doffing prosthesis: Complete Independence Prosthetic wear tolerance: 8 hours inconsistently/day, 7 days/week  Prosthetic weight bearing tolerance: 1 hour Edema: no visible swelling on eval, has stopped wearing shrinker. Residual limb condition: Sweat/moist, clean, well-shaped, mild invagination of scar that is well healed, less hair than proximal leg, warm. Prosthetic description:  ischial containment suction socket w/ flexible inner liner w/ polycentric 4-bar knee w/ pneumatic swing control and fixed ankle SACH foot K code/activity level with prosthetic use: Level 3   TODAY'S TREATMENT:       TherEx: Supine bridges x 10 reps Supine LLE hip flexion 2 x 10 reps (with prosthetic removed) Sidelying LLE hip abd 2 x 10 reps (with prosthetic removed) Added to HEP, see bolded below   Gait: STAIRS:  Level of Assistance: Min A  Stair Negotiation Technique: Step to Pattern with Bilateral Rails  Number of Stairs: 12   Height of Stairs: 6  Comments: step to ascending and descending, one instance of L knee buckling when ascending during first round  GAIT: Gait pattern: decreased hip/knee flexion- Left Distance walked: various clinic distances Assistive device utilized: Walker - 2 wheeled Level of assistance: Modified independence Comments: decreased tightness of LLE prosthetic as session progresses; encouraged pt to bring socks to next session to review sock wear    PATIENT EDUCATION: Education details: Continue HEP and walking program, bring socks to next session Person educated: Patient and Child(ren) Education method: Explanation Education comprehension: verbalized understanding and needs further education  HOME EXERCISE  PROGRAM: Access Code: [email or web address] URL: [email or web address] Date: 07/15/2022 Prepared by: Peter Congo  Exercises - Prone Hip Flexor Stretch with Towel Roll (AKA)  - 1 x daily - 7 x weekly - 1 sets - 1 reps - 10 minutes hold - Standing Hip Flexion March  - 1 x daily - 7 x weekly - 3 sets - 10 reps - Standing Hip Abduction  - 1 x daily - 7 x weekly - 3 sets - 10 reps - Standing Hip Extension with Chair  - 1 x daily - 7 x weekly - 3 sets - 10 reps - Sidelying Hip Abduction (AKA)  - 1 x daily - 7 x weekly - 3 sets - 10 reps  AMPUTEE SINK HEP (Provided to pt and daughter in [place] and spanish 5/16) Haz cada ejercicio 1-2 veces al da. Haz cada ejercicio 10 repeticiones. Mantenga cada ejercicio durante 2 segundos para sentir su ubicacin.  EN EL FREGADERO ENCUENTRE SU POSICIN EN LA LNEA MEDIA Y COLOQUE LOS PIES A LA IGUAL DISTANCIA DE LA LNEA MEDIA. Trate de encontrar esta posicin cuando est quieto para Arts development officer.  UTILICE CINTA EN EL PISO PARA MARCAR LA POSICIN DE LA LNEA MEDIA. Tambin debe intentar sentir con la extremidad la presin en la cavidad. Ests tratando de sentir con las extremidades lo que solas sentir con la planta del pie.  1. Desplazamiento de lado a lado: moviendo solo las caderas (no los hombros): [employer] el peso hacia la pierna izquierda, MANTENER/SENTIR. Vuelva a colocar el mismo peso en cada pierna, MANTENER/SENTIR. Mueva el peso sobre su pierna derecha, MANTENER/SENTIR. Vuelva a colocar el mismo peso en cada pierna, MANTENER/SENTIR. Repetir. Comience con ambas manos en el lavabo, avance solo hacia la derecha y luego sin manos. 2. Cambio de adelante hacia atrs: moviendo solo las caderas (no los hombros): [employer] el peso hacia adelante [employer] dedos de los pies, MANTENER/SENTIR. Mueva su peso hacia atrs para igualar el pie plano en ambas piernas, MANTENER/SENTIR. Mueva su peso nuevamente sobre sus talones, MANTENER/SENTIR. Mueva su peso hacia  atrs para igualarlo en ambas piernas, MANTENER/SENTIR. Repetir. comience con ambas manos en el fregadero, avance solo [employer] mano derecha, luego sin [place]. 3. Conos/copas en movimiento: Con el mismo peso en cada pierna: Sujtese con una mano la primera vez, luego avance hasta no apoyar las manos. Mueva las tazas de un lado del fregadero al otro. Coloque los vasos a ~2" fuera de su alcance, avance hasta 10" ms all de su alcance. Coloque una mano en el medio del fregadero y extienda la mano con la otra. Haz ambos brazos. Luego coloque una mano y [place] las tazas con la otra. 4. Alcanzar hacia arriba/hacia Tomasita Crumble: se alterna el alcance hasta los gabinetes superiores o el techo si no hay gabinetes presentes. Mantenga el mismo peso en cada pierna. Comience con Edison Simon apoyada en el mostrador mientras la otra mano se extiende  y avance hasta no apoyar la mano al Barista. Coloque una mano en el medio del fregadero y extindala con la [place]. Haz ambos brazos. Luego coloque una mano y [place] las tazas con la otra. 5. Mirando por encima de los hombros: Con el mismo peso en cada pierna: gire alternativamente para mirar por encima de los hombros con una mano apoyada en el mostrador segn sea necesario. Comience con movimientos de la [place] solo para mirar delante del hombro, luego incluso con el hombro y Programme researcher, broadcasting/film/video mirar detrs de usted. Para mirar hacia un lado, mueva la cabeza/los ojos, luego el hombro del costado que mira [place], cambie ms peso hacia el costado y tire de la cadera Risk manager. Coloca una mano en el medio del fregadero y suelta la otra para que tu hombro pueda retroceder. Cambia de mano para mirar hacia otro lado. Luego coloque una mano y [place] las tazas con la otra. 6. Al pisar con una pierna que no est amputada: retire de su camino los artculos que se encuentran debajo del gabinete. Mueva las caderas/pelvis para que el peso recaiga sobre la prtesis. PASE LENTAMENTE la otra pierna de  modo que la parte delantera del pie quede dentro del gabinete. Luego retroceda al piso.   Walking Program Using RW  (Por favor use su andador.): [employer] caminar durante un cierto perodo de Pharmacist, community                          Camine 10 minutos 1 vez al da.             Aumentar 2 minutos cada 7 das              Trabaje hasta 20 minutos seguidos (1 vez al da).               Ejemplo:                         Da 1-2           4-5 minutos     3 veces al da                         Da 7-8           10-12 minutos 2-3 veces al da                         Da 13-14       20-22 minutos 1-2 veces al da  ASSESSMENT:  CLINICAL IMPRESSION: Session limited by patient's late arrival. Emphasis of skilled PT session on working on LLE strengthening and gait training. Pt exhibits most difficulty performing hip abd agaist gravity this session. Pt also continues to exhibit decreased tightness of his LLE prosthetic as session progresses due to limb shrinkage, can benefit from education regarding wearing of socks in future sessions. Pt continues to benefit from skilled therapy services to work towards LTGs. Continue POC.   OBJECTIVE IMPAIRMENTS: Abnormal gait, decreased activity tolerance, decreased balance, decreased coordination, decreased endurance, decreased knowledge of use of DME, decreased mobility, difficulty walking, decreased ROM, decreased strength, decreased safety awareness, increased edema, improper body mechanics, and prosthetic dependency .   ACTIVITY LIMITATIONS: carrying, lifting, bending, standing, squatting, stairs, transfers, locomotion level, and caring for others  PARTICIPATION LIMITATIONS: meal prep, cleaning, laundry, driving, shopping, community activity, and  yard work  PERSONAL FACTORS: Age, Education, Fitness, Past/current experiences, Transportation, and 3+ comorbidities: HTN, CKD, DM2, and prostate cancer w/ ongoing radiation  are also affecting patient's functional outcome.    REHAB POTENTIAL: Good  CLINICAL DECISION MAKING: Evolving/moderate complexity  EVALUATION COMPLEXITY: Moderate   GOALS: Goals reviewed with patient? Yes  SHORT TERM GOALS: Target date: 08/09/2022  Pt will be independent and compliant with initial strength and balance HEP to improve functional mobility and prosthetic management. Baseline:  To be established. Goal status: MET  2.  Pt will decrease 5xSTS to </=13 seconds w/o UE support in order to demonstrate decreased risk for falls and improved functional bilateral LE strength and power. Baseline: 16.45 sec w/ BUE support, 14.63 sec with one UE support (5/30) Goal status: IN PROGRESS  3.  Pt will ambulate >/=605 feet on to demonstrate improved functional endurance for home and community participation. Baseline:  405' continuously w/ standard walker, 600 ft with RW (5/30) Goal status: IN PROGRESS  4.  Pt will demonstrate a gait speed of >/=1.48 feet/sec in order to decrease risk for falls. Baseline: 1.18 ft/sec w/ standard walker, 1.98 ft/sec with RW (5/30) Goal status: MET  5.  Pt will improve Berg score to 29/56 for decreased fall risk Baseline:  24/56 (5/6), 30/56 (5/30) Goal status: MET  LONG TERM GOALS: Target date: 09/06/2022  Patient to maintain established walking program x3 days per week to improve cardiovascular tolerance and functional mobility w/ prosthesis and LRAD as needed. Baseline:  To be established. Goal status: INITIAL  2.  Pt will ambulate >/=805 feet on to demonstrate improved functional endurance for home and community participation. Baseline: 405' continuously w/ standard walker, 600 ft with RW (5/30) Goal status: INITIAL  3.  Pt will demonstrate a gait speed of >/=2.0 feet/sec in order to decrease risk for falls. Baseline: 1.18 ft/sec w/ standard walker, 1.98 ft/sec with RW (5/30) Goal status: REVISED/UPGRADED  4.  Pt will improve Berg score to 34/56 for decreased fall risk Baseline:   24/56 (5/6), 30/56 (5/30) Goal status: INITIAL  5.  Pt will ambulate >/=250 feet over level surfaces using quad cane for improved access to home environment. Baseline: To be initiated. Goal status: INITIAL  6.  Pt will navigate 4 stairs SBA using single rail and cane as necessary to improve entry to home environment. Baseline:  To be assessed. Goal status: INITIAL   PLAN:  PT FREQUENCY: 2x/week  PT DURATION: 8 weeks  PLANNED INTERVENTIONS: Therapeutic exercises, Therapeutic activity, Neuromuscular re-education, Balance training, Gait training, Patient/Family education, Self Care, Joint mobilization, Stair training, Vestibular training, Prosthetic training, DME instructions, Manual therapy, and Re-evaluation  PLAN FOR NEXT SESSION: 10th PN, did pt bring socks? Assess left BP-did he take meds?  functional hip strengthening-hip flexion to prevent LLE hip abduction especially when walking, walking program-translate to [place].  Gait training w/ walker vs 4-prong cane.  Working towards quad cane for level ground and stairs., stairs with handrails first, LLE strengthening  Peter Congo, PT, DPT, CSRS 08/12/2022, 2:00 PM

## 2022-08-15 ENCOUNTER — Ambulatory Visit: Payer: 59 | Admitting: Physical Therapy

## 2022-08-16 ENCOUNTER — Ambulatory Visit: Payer: 59 | Admitting: Physical Therapy

## 2022-08-16 DIAGNOSIS — R2681 Unsteadiness on feet: Secondary | ICD-10-CM

## 2022-08-16 DIAGNOSIS — M6281 Muscle weakness (generalized): Secondary | ICD-10-CM

## 2022-08-16 DIAGNOSIS — R2689 Other abnormalities of gait and mobility: Secondary | ICD-10-CM | POA: Diagnosis not present

## 2022-08-16 NOTE — Therapy (Signed)
OUTPATIENT PHYSICAL THERAPY PROSTHETICS TREATMENT-10TH VISIT PROGRESS NOTE   Patient Name: Tim Vincent MRN: [id number] DOB:[birth year], 67 y.o., male Today's Date: 08/16/2022  PCP: Filomena Jungling, NP REFERRING PROVIDER: Nadara Mustard, MD  Physical Therapy Progress Note   Dates of Reporting Period: 07/12/22 - 08/15/22  See Note below for Objective Data and Assessment of Progress/Goals.  Thank you for the referral of this patient. Peter Congo, PT, DPT, CSRS   END OF SESSION:  PT End of Session - 08/16/22 0939     Visit Number 10    Number of Visits 17   16 + eval   Date for PT Re-Evaluation 09/13/22   pushed out due to frequency and scheduling conflicts w/ radiation therapy   Authorization Type [employer]ICARE    Progress Note Due on Visit 10    PT Start Time [phone]   pt arrived late   PT Stop Time 1016    PT Time Calculation (min) 39 min    Equipment Utilized During Treatment Gait belt    Activity Tolerance Patient tolerated treatment well    Behavior During Therapy WFL for tasks assessed/performed                  Past Medical History:  Diagnosis Date   Diabetes mellitus without complication (HCC)    Hypertension    Past Surgical History:  Procedure Laterality Date   AMPUTATION Left 04/17/2022   Procedure: LEFT ABOVE KNEE AMPUTATION;  Surgeon: Nadara Mustard, MD;  Location: [facility];  Service: Orthopedics;  Laterality: Left;   MASS EXCISION Left 04/17/2021   Procedure: EXCISION LEFT ELBOW MASS;  Surgeon: Allena Napoleon, MD;  Location: [facility];  Service: Plastics;  Laterality: Left;  1 hour   MASS EXCISION Left 08/10/2021   Procedure: EXCISION ELBOW MASS;  Surgeon: Allena Napoleon, MD;  Location: [facility];  Service: Plastics;  Laterality: Left;  1 hour   Patient Active Problem List   Diagnosis Date Noted   Diabetic infection of left foot (HCC) 04/17/2022   Necrotizing fasciitis of lower leg (HCC) 04/17/2022    Dyslipidemia 04/17/2022   Essential hypertension 01/12/2021   Obesity 01/12/2021   Skin sensation disturbance 01/12/2021   Type 2 diabetes mellitus without complication (HCC) 01/12/2021   Diabetic neuropathy (HCC) 01/12/2021   Hav (hallux abducto valgus), unspecified laterality 01/12/2021   Cough 10/31/2017   Elevated blood pressure reading 10/31/2017    ONSET DATE: 04/17/2022 (date of amputation)  REFERRING DIAG: N82.956 (ICD-10-CM) - S/P above knee amputation, left (HCC)  THERAPY DIAG:  Other abnormalities of gait and mobility  Muscle weakness (generalized)  Unsteadiness on feet  Rationale for Evaluation and Treatment: Rehabilitation  SUBJECTIVE:   SUBJECTIVE STATEMENT: Pt reports no acute changes, no pain, no falls since last visit.  Pt brings in residual limb socks today, yellow is size 1 and green is size 3. Pt states he has not needed the socks so far and his limb is fitting well. At end of session patient's daughter indicates that the patient has had some redness on his L residual limb after spending an extended period of time wearing his limb while seated in a car. Encouraged pt and his daughter to monitor the redness and this therapist can assess his skin next session.  Pt accompanied by: family Doctor, hospital; Spanish-language interpreter Vladimir Faster  PERTINENT HISTORY: HTN, DM2, CKD, prostate cancer undergoing radiation treatment and androgen deprivation therapy  PAIN:  Are you  having pain? No  PRECAUTIONS: Fall; pt is having radiation therapy M-F in the mornings  WEIGHT BEARING RESTRICTIONS: No  FALLS: Has patient fallen in last 6 months? Yes. Number of falls 2-his did not have prosthetic donned, thought his foot was there and tried to step and went down  LIVING ENVIRONMENT: Lives with: lives with their family and lives with their spouse-2 adult children, 1 son-in-law, 3 infant children Lives in: House/apartment Home Access: Stairs to enter Home layout:  One level Stairs: Yes: External: 4 steps; bilateral but cannot reach both Has following equipment at home: Dan Humphreys - 2 wheeled, Wheelchair (manual), and shower chair  PLOF: Independent with household mobility with device, Independent with transfers, Requires assistive device for independence, and Needs assistance with homemaking  PATIENT GOALS: "To walk better and learn to use the prosthetic."  OBJECTIVE:   DIAGNOSTIC FINDINGS: No recent relevant findings.  COGNITION: Overall cognitive status: Within functional limits for tasks assessed   SENSATION: Light touch: WFL  POSTURE: No Significant postural limitations; pt has right mass superior to olecranon (per MD notes related to gout)  LOWER EXTREMITY ROM:  Active ROM Right eval Left eval  Hip flexion WNL WFL  Hip extension    Hip abduction    Hip adduction    Hip internal rotation    Hip external rotation    Knee flexion    Knee extension    Ankle dorsiflexion    Ankle plantarflexion    Ankle inversion    Ankle eversion     (Blank rows = not tested)  LOWER EXTREMITY MMT:  MMT Right eval Left eval  Hip flexion 4+/5 4/5  Hip extension    Hip abduction 4/5 3+/5  Hip adduction 5/5 3/5  Hip internal rotation    Hip external rotation    Knee flexion 5/5   Knee extension 5/5   Ankle dorsiflexion 5/5   Ankle plantarflexion    Ankle inversion    Ankle eversion    (Blank rows = not tested)  BED MOBILITY:  Sit to supine Complete Independence Supine to sit Complete Independence Rolling to Right Complete Independence Rolling to Left Complete Independence  TRANSFERS: Sit to stand: Complete Independence Stand to sit: Complete Independence Floor transfers: daughter states family assisted when pt fell to floor  GAIT: Gait pattern: step to pattern, decreased hip/knee flexion- Left, and trunk flexed Distance walked: 405' + various clinic distances Assistive device utilized:  standard no wheel walker Level of  assistance: SBA and CGA Gait velocity: 1.18 ft/sec Comments: Pt traverses too far forward into walker w/ intermittent misplacement of prosthetic causing toe catch x2.  He advances standard walker too far forward during each stride resulting in large step-to stride pattern.  FUNCTIONAL TESTS:  5 times sit to stand: 16.45 seconds w/ BUE support-transfers UE support to RW, needed restart x1 due to instability initially 6 minute walk test: 405' w/ standard walker 10 meter walk test: 28.07 seconds w/ standard walker = 0.36 m/sec OR 1.18 ft/sec Berg Balance Scale: To be assessed.  CURRENT PROSTHETIC WEAR ASSESSMENT: Patient is independent with: residual limb care, care of non-amputated limb, prosthetic cleaning, and ply sock cleaning Patient is dependent with: skin check, correct ply sock adjustment, proper wear schedule/adjustment, and proper weight-bearing schedule/adjustment Donning prosthesis: Complete Independence Doffing prosthesis: Complete Independence Prosthetic wear tolerance: 8 hours inconsistently/day, 7 days/week  Prosthetic weight bearing tolerance: 1 hour Edema: no visible swelling on eval, has stopped wearing shrinker. Residual limb condition: Sweat/moist, clean, well-shaped, mild  invagination of scar that is well healed, less hair than proximal leg, warm. Prosthetic description:  ischial containment suction socket w/ flexible inner liner w/ polycentric 4-bar knee w/ pneumatic swing control and fixed ankle SACH foot K code/activity level with prosthetic use: Level 3   TODAY'S TREATMENT:       TherAct: In // bars to work on L hip strengthening with BUE support and CGA: 4" hurdle forwardstep overs with LLE with focus on increasing step height but keeping step length at a good length and not taking too long of a step, 6 x 10 ft 4" hurdle lateral step overs 3 x 10 ft each direction 2" foam beam step overs x 10 reps (hip circumduction and difficulty with hip extension without  flexing trunk) 1" foam beam step overs 2 x 10 reps with improved hip control noted  In // bars with no UE support and min A: Gait 6 x 10 ft with intermittent forearm bracing against // bars Backwards gait 4 x 10 ft with BUE support and min A to work on hip strengthening   PATIENT EDUCATION: Education details: Continue HEP and walking program Person educated: Patient and Child(ren) Education method: Explanation Education comprehension: verbalized understanding and needs further education  HOME EXERCISE PROGRAM: Access Code: [email or web address] URL: [email or web address] Date: 07/15/2022 Prepared by: Peter Congo  Exercises - Prone Hip Flexor Stretch with Towel Roll (AKA)  - 1 x daily - 7 x weekly - 1 sets - 1 reps - 10 minutes hold - Standing Hip Flexion March  - 1 x daily - 7 x weekly - 3 sets - 10 reps - Standing Hip Abduction  - 1 x daily - 7 x weekly - 3 sets - 10 reps - Standing Hip Extension with Chair  - 1 x daily - 7 x weekly - 3 sets - 10 reps - Sidelying Hip Abduction (AKA)  - 1 x daily - 7 x weekly - 3 sets - 10 reps  AMPUTEE SINK HEP (Provided to pt and daughter in [place] and spanish 5/16) Haz cada ejercicio 1-2 veces al da. Haz cada ejercicio 10 repeticiones. Mantenga cada ejercicio durante 2 segundos para sentir su ubicacin.  EN EL FREGADERO ENCUENTRE SU POSICIN EN LA LNEA MEDIA Y COLOQUE LOS PIES A LA IGUAL DISTANCIA DE LA LNEA MEDIA. Trate de encontrar esta posicin cuando est quieto para Arts development officer.  UTILICE CINTA EN EL PISO PARA MARCAR LA POSICIN DE LA LNEA MEDIA. Tambin debe intentar sentir con la extremidad la presin en la cavidad. Ests tratando de sentir con las extremidades lo que solas sentir con la planta del pie.  1. Desplazamiento de lado a lado: moviendo solo las caderas (no los hombros): [employer] el peso hacia la pierna izquierda, MANTENER/SENTIR. Vuelva a colocar el mismo peso en cada pierna, MANTENER/SENTIR. Mueva el peso  sobre su pierna derecha, MANTENER/SENTIR. Vuelva a colocar el mismo peso en cada pierna, MANTENER/SENTIR. Repetir. Comience con ambas manos en el lavabo, avance solo hacia la derecha y luego sin manos. 2. Cambio de adelante hacia atrs: moviendo solo las caderas (no los hombros): [employer] el peso hacia adelante [employer] dedos de los pies, MANTENER/SENTIR. Mueva su peso hacia atrs para igualar el pie plano en ambas piernas, MANTENER/SENTIR. Mueva su peso nuevamente sobre sus talones, MANTENER/SENTIR. Mueva su peso hacia atrs para igualarlo en ambas piernas, MANTENER/SENTIR. Repetir. comience con ambas manos en el fregadero, avance solo [employer] mano derecha, luego sin [place]. 3. Conos/copas en movimiento: Con el mismo  peso en cada pierna: Sujtese con una mano la primera vez, luego avance hasta no apoyar las manos. Mueva las tazas de un lado del fregadero al otro. Coloque los vasos a ~2" fuera de su alcance, avance hasta 10" ms all de su alcance. Coloque una mano en el medio del fregadero y extienda la mano con la otra. Haz ambos brazos. Luego coloque una mano y [place] las tazas con la otra. 4. Alcanzar hacia arriba/hacia Tomasita Crumble: se alterna el alcance hasta los gabinetes superiores o el techo si no hay gabinetes presentes. Mantenga el mismo peso en cada pierna. Comience con Edison Simon apoyada en el mostrador mientras la otra mano se extiende y avance hasta no apoyar la mano al Barista. Coloque una mano en el medio del fregadero y extindala con la [place]. Haz ambos brazos. Luego coloque una mano y [place] las tazas con la otra. 5. Mirando por encima de los hombros: Con el mismo peso en cada pierna: gire alternativamente para mirar por encima de los hombros con una mano apoyada en el mostrador segn sea necesario. Comience con movimientos de la [place] solo para mirar delante del hombro, luego incluso con el hombro y Programme researcher, broadcasting/film/video mirar detrs de usted. Para mirar hacia un lado, mueva la cabeza/los ojos, luego el  hombro del costado que mira [place], cambie ms peso hacia el costado y tire de la cadera Risk manager. Coloca una mano en el medio del fregadero y suelta la otra para que tu hombro pueda retroceder. Cambia de mano para mirar hacia otro lado. Luego coloque una mano y [place] las tazas con la otra. 6. Al pisar con una pierna que no est amputada: retire de su camino los artculos que se encuentran debajo del gabinete. Mueva las caderas/pelvis para que el peso recaiga sobre la prtesis. PASE LENTAMENTE la otra pierna de modo que la parte delantera del pie quede dentro del gabinete. Luego retroceda al piso.   Walking Program Using RW  (Por favor use su andador.): [employer] caminar durante un cierto perodo de Pharmacist, community                          Camine 10 minutos 1 vez al da.             Aumentar 2 minutos cada 7 das              Trabaje hasta 20 minutos seguidos (1 vez al da).               Ejemplo:                         Da 1-2           4-5 minutos     3 veces al da                         Da 7-8           10-12 minutos 2-3 veces al da                         Da 13-14       20-22 minutos 1-2 veces al da  ASSESSMENT:  CLINICAL IMPRESSION: Session limited by patient's late arrival. Emphasis of skilled PT session on continuing to work on L hip strengthening to improve step height and step length during  gait. Pt continues to exhibit some L hip weakness leading to above-mentioned gait impairments. Pt continues to benefit from skilled therapy services to work towards improving his balance in order to increase his safety and independence with functional mobility as well as continue amputee education. Continue POC.   OBJECTIVE IMPAIRMENTS: Abnormal gait, decreased activity tolerance, decreased balance, decreased coordination, decreased endurance, decreased knowledge of use of DME, decreased mobility, difficulty walking, decreased ROM, decreased strength, decreased safety awareness,  increased edema, improper body mechanics, and prosthetic dependency .   ACTIVITY LIMITATIONS: carrying, lifting, bending, standing, squatting, stairs, transfers, locomotion level, and caring for others  PARTICIPATION LIMITATIONS: meal prep, cleaning, laundry, driving, shopping, community activity, and yard work  PERSONAL FACTORS: Age, Education, Fitness, Past/current experiences, Transportation, and 3+ comorbidities: HTN, CKD, DM2, and prostate cancer w/ ongoing radiation  are also affecting patient's functional outcome.   REHAB POTENTIAL: Good  CLINICAL DECISION MAKING: Evolving/moderate complexity  EVALUATION COMPLEXITY: Moderate   GOALS: Goals reviewed with patient? Yes  SHORT TERM GOALS: Target date: 08/09/2022  Pt will be independent and compliant with initial strength and balance HEP to improve functional mobility and prosthetic management. Baseline:  To be established. Goal status: MET  2.  Pt will decrease 5xSTS to </=13 seconds w/o UE support in order to demonstrate decreased risk for falls and improved functional bilateral LE strength and power. Baseline: 16.45 sec w/ BUE support, 14.63 sec with one UE support (5/30) Goal status: IN PROGRESS  3.  Pt will ambulate >/=605 feet on to demonstrate improved functional endurance for home and community participation. Baseline:  405' continuously w/ standard walker, 600 ft with RW (5/30) Goal status: IN PROGRESS  4.  Pt will demonstrate a gait speed of >/=1.48 feet/sec in order to decrease risk for falls. Baseline: 1.18 ft/sec w/ standard walker, 1.98 ft/sec with RW (5/30) Goal status: MET  5.  Pt will improve Berg score to 29/56 for decreased fall risk Baseline:  24/56 (5/6), 30/56 (5/30) Goal status: MET  LONG TERM GOALS: Target date: 09/06/2022  Patient to maintain established walking program x3 days per week to improve cardiovascular tolerance and functional mobility w/ prosthesis and LRAD as needed. Baseline:  To  be established. Goal status: INITIAL  2.  Pt will ambulate >/=805 feet on to demonstrate improved functional endurance for home and community participation. Baseline: 405' continuously w/ standard walker, 600 ft with RW (5/30) Goal status: INITIAL  3.  Pt will demonstrate a gait speed of >/=2.0 feet/sec in order to decrease risk for falls. Baseline: 1.18 ft/sec w/ standard walker, 1.98 ft/sec with RW (5/30) Goal status: REVISED/UPGRADED  4.  Pt will improve Berg score to 34/56 for decreased fall risk Baseline:  24/56 (5/6), 30/56 (5/30) Goal status: INITIAL  5.  Pt will ambulate >/=250 feet over level surfaces using quad cane for improved access to home environment. Baseline: To be initiated. Goal status: INITIAL  6.  Pt will navigate 4 stairs SBA using single rail and cane as necessary to improve entry to home environment. Baseline:  To be assessed. Goal status: INITIAL   PLAN:  PT FREQUENCY: 2x/week  PT DURATION: 8 weeks  PLANNED INTERVENTIONS: Therapeutic exercises, Therapeutic activity, Neuromuscular re-education, Balance training, Gait training, Patient/Family education, Self Care, Joint mobilization, Stair training, Vestibular training, Prosthetic training, DME instructions, Manual therapy, and Re-evaluation  PLAN FOR NEXT SESSION: Assess left BP-did he take meds?  functional hip strengthening-hip flexion to prevent LLE hip abduction especially when walking, walking program-translate  to [place].  Gait training w/ walker vs 4-prong cane.  Working towards quad cane for level ground and stairs., stairs with handrails first, LLE strengthening. redness on L upper thigh--look at leg (may need to try socks for cushioning)  Peter Congo, PT, DPT, CSRS 08/16/2022, 10:17 AM

## 2022-08-17 DIAGNOSIS — E11628 Type 2 diabetes mellitus with other skin complications: Secondary | ICD-10-CM | POA: Diagnosis not present

## 2022-08-17 DIAGNOSIS — L089 Local infection of the skin and subcutaneous tissue, unspecified: Secondary | ICD-10-CM | POA: Diagnosis not present

## 2022-08-17 DIAGNOSIS — M726 Necrotizing fasciitis: Secondary | ICD-10-CM | POA: Diagnosis not present

## 2022-08-19 ENCOUNTER — Telehealth: Payer: Self-pay | Admitting: Plastic Surgery

## 2022-08-19 ENCOUNTER — Ambulatory Visit: Payer: 59 | Admitting: Physical Therapy

## 2022-08-19 ENCOUNTER — Encounter: Payer: 59 | Admitting: Physical Therapy

## 2022-08-19 DIAGNOSIS — R2689 Other abnormalities of gait and mobility: Secondary | ICD-10-CM | POA: Diagnosis not present

## 2022-08-19 DIAGNOSIS — M6281 Muscle weakness (generalized): Secondary | ICD-10-CM | POA: Diagnosis not present

## 2022-08-19 DIAGNOSIS — R2681 Unsteadiness on feet: Secondary | ICD-10-CM | POA: Diagnosis not present

## 2022-08-19 NOTE — Therapy (Signed)
OUTPATIENT PHYSICAL THERAPY PROSTHETICS TREATMENT   Patient Name: Tim Vincent MRN: [id number] DOB:1955-09-29, 67 y.o., male Today's Date: 08/19/2022  PCP: Filomena Jungling, NP REFERRING PROVIDER: Nadara Mustard, MD    END OF SESSION:  PT End of Session - 08/19/22 1411     Visit Number 11    Number of Visits 17   16 + eval   Date for PT Re-Evaluation 09/13/22   pushed out due to frequency and scheduling conflicts w/ radiation therapy   Authorization Type [employer]ICARE    Progress Note Due on Visit 10    PT Start Time 1410   pt arrived late   PT Stop Time 1448    PT Time Calculation (min) 38 min    Equipment Utilized During Treatment Gait belt    Activity Tolerance Patient tolerated treatment well    Behavior During Therapy WFL for tasks assessed/performed                   Past Medical History:  Diagnosis Date   Diabetes mellitus without complication (HCC)    Hypertension    Past Surgical History:  Procedure Laterality Date   AMPUTATION Left 04/17/2022   Procedure: LEFT ABOVE KNEE AMPUTATION;  Surgeon: Nadara Mustard, MD;  Location: [facility];  Service: Orthopedics;  Laterality: Left;   MASS EXCISION Left 04/17/2021   Procedure: EXCISION LEFT ELBOW MASS;  Surgeon: Allena Napoleon, MD;  Location: [facility];  Service: Plastics;  Laterality: Left;  1 hour   MASS EXCISION Left 08/10/2021   Procedure: EXCISION ELBOW MASS;  Surgeon: Allena Napoleon, MD;  Location: [facility];  Service: Plastics;  Laterality: Left;  1 hour   Patient Active Problem List   Diagnosis Date Noted   Diabetic infection of left foot (HCC) 04/17/2022   Necrotizing fasciitis of lower leg (HCC) 04/17/2022   Dyslipidemia 04/17/2022   Essential hypertension 01/12/2021   Obesity 01/12/2021   Skin sensation disturbance 01/12/2021   Type 2 diabetes mellitus without complication (HCC) 01/12/2021   Diabetic neuropathy (HCC) 01/12/2021   Hav (hallux abducto  valgus), unspecified laterality 01/12/2021   Cough 10/31/2017   Elevated blood pressure reading 10/31/2017    ONSET DATE: 04/17/2022 (date of amputation)  REFERRING DIAG: W09.811 (ICD-10-CM) - S/P above knee amputation, left (HCC)  THERAPY DIAG:  Other abnormalities of gait and mobility  Muscle weakness (generalized)  Unsteadiness on feet  Rationale for Evaluation and Treatment: Rehabilitation  SUBJECTIVE:   SUBJECTIVE STATEMENT: Pt reports no acute changes, no pain, no falls since last visit.  Pt reports his left leg (prosthetic) is fitting better and that his redness has improved from last week. Pt wearing just one yellow sock today so has a tighter fit than when he wasn't wearing any socks.  Pt accompanied by: family Doctor, hospital; Spanish-language interpreter Lyn Hollingshead.  PERTINENT HISTORY: HTN, DM2, CKD, prostate cancer undergoing radiation treatment and androgen deprivation therapy  PAIN:  Are you having pain? No  PRECAUTIONS: Fall; pt is having radiation therapy M-F in the mornings  WEIGHT BEARING RESTRICTIONS: No  FALLS: Has patient fallen in last 6 months? Yes. Number of falls 2-his did not have prosthetic donned, thought his foot was there and tried to step and went down  LIVING ENVIRONMENT: Lives with: lives with their family and lives with their spouse-2 adult children, 1 son-in-law, 3 infant children Lives in: House/apartment Home Access: Stairs to enter Home layout: One level Stairs: Yes: External:  4 steps; bilateral but cannot reach both Has following equipment at home: Dan Humphreys - 2 wheeled, Wheelchair (manual), and shower chair  PLOF: Independent with household mobility with device, Independent with transfers, Requires assistive device for independence, and Needs assistance with homemaking  PATIENT GOALS: "To walk better and learn to use the prosthetic."  OBJECTIVE:   DIAGNOSTIC FINDINGS: No recent relevant findings.  COGNITION: Overall  cognitive status: Within functional limits for tasks assessed   SENSATION: Light touch: WFL  POSTURE: No Significant postural limitations; pt has right mass superior to olecranon (per MD notes related to gout)  LOWER EXTREMITY ROM:  Active ROM Right eval Left eval  Hip flexion WNL WFL  Hip extension    Hip abduction    Hip adduction    Hip internal rotation    Hip external rotation    Knee flexion    Knee extension    Ankle dorsiflexion    Ankle plantarflexion    Ankle inversion    Ankle eversion     (Blank rows = not tested)  LOWER EXTREMITY MMT:  MMT Right eval Left eval  Hip flexion 4+/5 4/5  Hip extension    Hip abduction 4/5 3+/5  Hip adduction 5/5 3/5  Hip internal rotation    Hip external rotation    Knee flexion 5/5   Knee extension 5/5   Ankle dorsiflexion 5/5   Ankle plantarflexion    Ankle inversion    Ankle eversion    (Blank rows = not tested)  BED MOBILITY:  Sit to supine Complete Independence Supine to sit Complete Independence Rolling to Right Complete Independence Rolling to Left Complete Independence  TRANSFERS: Sit to stand: Complete Independence Stand to sit: Complete Independence Floor transfers: daughter states family assisted when pt fell to floor  GAIT: Gait pattern: step to pattern, decreased hip/knee flexion- Left, and trunk flexed Distance walked: 405' + various clinic distances Assistive device utilized:  standard no wheel walker Level of assistance: SBA and CGA Gait velocity: 1.18 ft/sec Comments: Pt traverses too far forward into walker w/ intermittent misplacement of prosthetic causing toe catch x2.  He advances standard walker too far forward during each stride resulting in large step-to stride pattern.  FUNCTIONAL TESTS:  5 times sit to stand: 16.45 seconds w/ BUE support-transfers UE support to RW, needed restart x1 due to instability initially 6 minute walk test: 405' w/ standard walker 10 meter walk test: 28.07  seconds w/ standard walker = 0.36 m/sec OR 1.18 ft/sec Berg Balance Scale: To be assessed.  CURRENT PROSTHETIC WEAR ASSESSMENT: Patient is independent with: residual limb care, care of non-amputated limb, prosthetic cleaning, and ply sock cleaning Patient is dependent with: skin check, correct ply sock adjustment, proper wear schedule/adjustment, and proper weight-bearing schedule/adjustment Donning prosthesis: Complete Independence Doffing prosthesis: Complete Independence Prosthetic wear tolerance: 8 hours inconsistently/day, 7 days/week  Prosthetic weight bearing tolerance: 1 hour Edema: no visible swelling on eval, has stopped wearing shrinker. Residual limb condition: Sweat/moist, clean, well-shaped, mild invagination of scar that is well healed, less hair than proximal leg, warm. Prosthetic description:  ischial containment suction socket w/ flexible inner liner w/ polycentric 4-bar knee w/ pneumatic swing control and fixed ankle SACH foot K code/activity level with prosthetic use: Level 3   TODAY'S TREATMENT:       TherAct: Pt able to doff prosthetic, sock, and silicone liner so that this therapist can perform skin inspections. Pt does present with some redness as well as an open area of skin  at the top of his L thigh. Per pt and his daughter this is from sitting in the car for an extended period of time last weekend and having the prosthetic pushing into his leg. Pt and his daughter also report that there was a lot of moisture build up (sweat). Pt does have antiperspirant spray and plans to wear this going forwards. Educated pt to avoid wearing his limb if he is going to be sitting in a car for an extended period of time and also to spend some time with week not wearing his limb to allow his skin to heal. Pt can benefit from ongoing education regarding importance of skin inspection and monitoring healing of current skin issues.  To work on L hip strengthening: At bottom of stairs with  BUE support: Attempted 6" step taps with LLE, pt exhibits circumduction of LLE so deferred this step height for now In // bars with BUE support: LLE 4" step taps (easy) Tried to add RTB but pt unable to extend knee joint of prosthetic Added in 5# ankle weight to 4" step taps 3 x 10-15 reps Standing L hip abduction 3 x 10 reps with 5# ankle weight Standing hip extension 2 x 15 reps with 5# ankle weight while standing on 4" step with RLE   PATIENT EDUCATION: Education details: Continue HEP and walking program Person educated: Patient and Child(ren) Education method: Explanation Education comprehension: verbalized understanding and needs further education  HOME EXERCISE PROGRAM: Access Code: [email or web address] URL: [email or web address] Date: 07/15/2022 Prepared by: Peter Congo  Exercises - Prone Hip Flexor Stretch with Towel Roll (AKA)  - 1 x daily - 7 x weekly - 1 sets - 1 reps - 10 minutes hold - Standing Hip Flexion March  - 1 x daily - 7 x weekly - 3 sets - 10 reps - Standing Hip Abduction  - 1 x daily - 7 x weekly - 3 sets - 10 reps - Standing Hip Extension with Chair  - 1 x daily - 7 x weekly - 3 sets - 10 reps - Sidelying Hip Abduction (AKA)  - 1 x daily - 7 x weekly - 3 sets - 10 reps  AMPUTEE SINK HEP (Provided to pt and daughter in [place] and spanish 5/16) Haz cada ejercicio 1-2 veces al da. Haz cada ejercicio 10 repeticiones. Mantenga cada ejercicio durante 2 segundos para sentir su ubicacin.  EN EL FREGADERO ENCUENTRE SU POSICIN EN LA LNEA MEDIA Y COLOQUE LOS PIES A LA IGUAL DISTANCIA DE LA LNEA MEDIA. Trate de encontrar esta posicin cuando est quieto para Arts development officer.  UTILICE CINTA EN EL PISO PARA MARCAR LA POSICIN DE LA LNEA MEDIA. Tambin debe intentar sentir con la extremidad la presin en la cavidad. Ests tratando de sentir con las extremidades lo que solas sentir con la planta del pie.  1. Desplazamiento de lado a lado: moviendo solo  las caderas (no los hombros): [employer] el peso hacia la pierna izquierda, MANTENER/SENTIR. Vuelva a colocar el mismo peso en cada pierna, MANTENER/SENTIR. Mueva el peso sobre su pierna derecha, MANTENER/SENTIR. Vuelva a colocar el mismo peso en cada pierna, MANTENER/SENTIR. Repetir. Comience con ambas manos en el lavabo, avance solo hacia la derecha y luego sin manos. 2. Cambio de adelante hacia atrs: moviendo solo las caderas (no los hombros): [employer] el peso hacia adelante [employer] dedos de los pies, MANTENER/SENTIR. Mueva su peso hacia atrs para igualar el pie plano en ambas piernas, MANTENER/SENTIR. Mueva su peso nuevamente sobre sus [place],  MANTENER/SENTIR. Mueva su peso hacia atrs para igualarlo en ambas piernas, MANTENER/SENTIR. Repetir. comience con ambas manos en el fregadero, avance solo [employer] mano derecha, luego sin [place]. 3. Conos/copas en movimiento: Con el mismo peso en cada pierna: Sujtese con una mano la primera vez, luego avance hasta no apoyar las manos. Mueva las tazas de un lado del fregadero al otro. Coloque los vasos a ~2" fuera de su alcance, avance hasta 10" ms all de su alcance. Coloque una mano en el medio del fregadero y extienda la mano con la otra. Haz ambos brazos. Luego coloque una mano y [place] las tazas con la otra. 4. Alcanzar hacia arriba/hacia Tomasita Crumble: se alterna el alcance hasta los gabinetes superiores o el techo si no hay gabinetes presentes. Mantenga el mismo peso en cada pierna. Comience con Edison Simon apoyada en el mostrador mientras la otra mano se extiende y avance hasta no apoyar la mano al [place]. Coloque una mano en el medio del fregadero y extindala con la [place]. Haz ambos brazos. Luego coloque una mano y [place] las tazas con la otra. 5. Mirando por encima de los hombros: Con el mismo peso en cada pierna: gire alternativamente para mirar por encima de los hombros con una mano apoyada en el mostrador segn sea necesario. Comience con movimientos de la [place]  solo para mirar delante del hombro, luego incluso con el hombro y Programme researcher, broadcasting/film/video mirar detrs de usted. Para mirar hacia un lado, mueva la cabeza/los ojos, luego el hombro del costado que mira [place], cambie ms peso hacia el costado y tire de la cadera Risk manager. Coloca una mano en el medio del fregadero y suelta la otra para que tu hombro pueda retroceder. Cambia de mano para mirar hacia otro lado. Luego coloque una mano y [place] las tazas con la otra. 6. Al pisar con una pierna que no est amputada: retire de su camino los artculos que se encuentran debajo del gabinete. Mueva las caderas/pelvis para que el peso recaiga sobre la prtesis. PASE [place]ENT[place]ENT[place] la otra pierna de modo que la parte delantera del pie quede dentro del gabinete. Luego retroceda al piso.   Walking Program Using RW  (Por favor use su andador.): [employer] caminar durante un cierto perodo de Pharmacist, community                          Camine 10 minutos 1 vez al da.             Aumentar 2 minutos cada 7 das              Trabaje hasta 20 minutos seguidos (1 vez al da).               Ejemplo:                         Da 1-2           4-5 minutos     3 veces al da                         Da 7-8           10-12 minutos 2-3 veces al da                         Da 13-14       20-22 minutos  1-2 veces al da  ASSESSMENT:  CLINICAL IMPRESSION: Session limited by patient's late arrival. Emphasis of skilled PT session on assessing pt's skin integrity on his LLE residual limb as well as continuing to work on L hip strengthening. Pt with some areas of redness and open skin on proximal portion of his L residual limb. Pt educated on importance of maintaining good skin integrity and appropriate wear schedule of his prosthetic to prevent further skin breakdown and to prevent healing. Pt continues to benefit from skilled therapy services to work towards increasing his safety and independence with functional mobility as well as continuing to  educate him regarding his amputation and his prosthetic. Continue POC.   OBJECTIVE IMPAIRMENTS: Abnormal gait, decreased activity tolerance, decreased balance, decreased coordination, decreased endurance, decreased knowledge of use of DME, decreased mobility, difficulty walking, decreased ROM, decreased strength, decreased safety awareness, increased edema, improper body mechanics, and prosthetic dependency .   ACTIVITY LIMITATIONS: carrying, lifting, bending, standing, squatting, stairs, transfers, locomotion level, and caring for others  PARTICIPATION LIMITATIONS: meal prep, cleaning, laundry, driving, shopping, community activity, and yard work  PERSONAL FACTORS: Age, Education, Fitness, Past/current experiences, Transportation, and 3+ comorbidities: HTN, CKD, DM2, and prostate cancer w/ ongoing radiation  are also affecting patient's functional outcome.   REHAB POTENTIAL: Good  CLINICAL DECISION MAKING: Evolving/moderate complexity  EVALUATION COMPLEXITY: Moderate   GOALS: Goals reviewed with patient? Yes  SHORT TERM GOALS: Target date: 08/09/2022  Pt will be independent and compliant with initial strength and balance HEP to improve functional mobility and prosthetic management. Baseline:  To be established. Goal status: MET  2.  Pt will decrease 5xSTS to </=13 seconds w/o UE support in order to demonstrate decreased risk for falls and improved functional bilateral LE strength and power. Baseline: 16.45 sec w/ BUE support, 14.63 sec with one UE support (5/30) Goal status: IN PROGRESS  3.  Pt will ambulate >/=605 feet on to demonstrate improved functional endurance for home and community participation. Baseline:  405' continuously w/ standard walker, 600 ft with RW (5/30) Goal status: IN PROGRESS  4.  Pt will demonstrate a gait speed of >/=1.48 feet/sec in order to decrease risk for falls. Baseline: 1.18 ft/sec w/ standard walker, 1.98 ft/sec with RW (5/30) Goal status:  MET  5.  Pt will improve Berg score to 29/56 for decreased fall risk Baseline:  24/56 (5/6), 30/56 (5/30) Goal status: MET  LONG TERM GOALS: Target date: 09/06/2022  Patient to maintain established walking program x3 days per week to improve cardiovascular tolerance and functional mobility w/ prosthesis and LRAD as needed. Baseline:  To be established. Goal status: INITIAL  2.  Pt will ambulate >/=805 feet on to demonstrate improved functional endurance for home and community participation. Baseline: 405' continuously w/ standard walker, 600 ft with RW (5/30) Goal status: INITIAL  3.  Pt will demonstrate a gait speed of >/=2.0 feet/sec in order to decrease risk for falls. Baseline: 1.18 ft/sec w/ standard walker, 1.98 ft/sec with RW (5/30) Goal status: REVISED/UPGRADED  4.  Pt will improve Berg score to 34/56 for decreased fall risk Baseline:  24/56 (5/6), 30/56 (5/30) Goal status: INITIAL  5.  Pt will ambulate >/=250 feet over level surfaces using quad cane for improved access to home environment. Baseline: To be initiated. Goal status: INITIAL  6.  Pt will navigate 4 stairs SBA using single rail and cane as necessary to improve entry to home environment. Baseline:  To be assessed. Goal status: INITIAL  PLAN:  PT FREQUENCY: 2x/week  PT DURATION: 8 weeks  PLANNED INTERVENTIONS: Therapeutic exercises, Therapeutic activity, Neuromuscular re-education, Balance training, Gait training, Patient/Family education, Self Care, Joint mobilization, Stair training, Vestibular training, Prosthetic training, DME instructions, Manual therapy, and Re-evaluation  PLAN FOR NEXT SESSION: Assess left BP-did he take meds?  functional hip strengthening-hip flexion to prevent LLE hip abduction especially when walking, walking program-translate to [place].  Gait training w/ walker vs 4-prong cane.  Working towards quad cane for level ground and stairs., stairs with handrails first, LLE  strengthening. redness on L upper thigh--look at leg (may need to try socks for cushioning), work more on walking and balance on Thu (step through gait pattern)  Peter Congo, PT, DPT, CSRS 08/19/2022, 2:52 PM

## 2022-08-19 NOTE — Telephone Encounter (Signed)
Pt. Had consult with Dr. Ladona Ridgel on 05/08/2022.  Pt. Has been trying to reach scheduler and was following up regarding Left elbow.  Pt. wants to move ahead to the next step. Please call at [phone].

## 2022-08-22 ENCOUNTER — Ambulatory Visit: Payer: 59 | Admitting: Physical Therapy

## 2022-08-22 DIAGNOSIS — M6281 Muscle weakness (generalized): Secondary | ICD-10-CM

## 2022-08-22 DIAGNOSIS — R2681 Unsteadiness on feet: Secondary | ICD-10-CM

## 2022-08-22 DIAGNOSIS — R2689 Other abnormalities of gait and mobility: Secondary | ICD-10-CM | POA: Diagnosis not present

## 2022-08-22 NOTE — Therapy (Signed)
OUTPATIENT PHYSICAL THERAPY PROSTHETICS TREATMENT   Patient Name: Tim Vincent MRN: [id number] DOB:[birth year], 67 y.o., male Today's Date: 08/22/2022  PCP: Filomena Jungling, NP REFERRING PROVIDER: Nadara Mustard, MD    END OF SESSION:  PT End of Session - 08/22/22 1411     Visit Number 12    Number of Visits 17   16 + eval   Date for PT Re-Evaluation 09/13/22   pushed out due to frequency and scheduling conflicts w/ radiation therapy   Authorization Type [employer]ICARE    Progress Note Due on Visit 10    PT Start Time 1408   pt arrived late   PT Stop Time 1447    PT Time Calculation (min) 39 min    Equipment Utilized During Treatment Gait belt    Activity Tolerance Patient tolerated treatment well    Behavior During Therapy WFL for tasks assessed/performed                    Past Medical History:  Diagnosis Date   Diabetes mellitus without complication (HCC)    Hypertension    Past Surgical History:  Procedure Laterality Date   AMPUTATION Left 04/17/2022   Procedure: LEFT ABOVE KNEE AMPUTATION;  Surgeon: Nadara Mustard, MD;  Location: [facility];  Service: Orthopedics;  Laterality: Left;   MASS EXCISION Left 04/17/2021   Procedure: EXCISION LEFT ELBOW MASS;  Surgeon: Allena Napoleon, MD;  Location: [facility];  Service: Plastics;  Laterality: Left;  1 hour   MASS EXCISION Left 08/10/2021   Procedure: EXCISION ELBOW MASS;  Surgeon: Allena Napoleon, MD;  Location: [facility];  Service: Plastics;  Laterality: Left;  1 hour   Patient Active Problem List   Diagnosis Date Noted   Diabetic infection of left foot (HCC) 04/17/2022   Necrotizing fasciitis of lower leg (HCC) 04/17/2022   Dyslipidemia 04/17/2022   Essential hypertension 01/12/2021   Obesity 01/12/2021   Skin sensation disturbance 01/12/2021   Type 2 diabetes mellitus without complication (HCC) 01/12/2021   Diabetic neuropathy (HCC) 01/12/2021   Hav (hallux abducto  valgus), unspecified laterality 01/12/2021   Cough 10/31/2017   Elevated blood pressure reading 10/31/2017    ONSET DATE: 04/17/2022 (date of amputation)  REFERRING DIAG: W09.811 (ICD-10-CM) - S/P above knee amputation, left (HCC)  THERAPY DIAG:  Other abnormalities of gait and mobility  Muscle weakness (generalized)  Unsteadiness on feet  Rationale for Evaluation and Treatment: Rehabilitation  SUBJECTIVE:   SUBJECTIVE STATEMENT: Pt walks into clinic today with his LLE prosthetic set so that his toe is turned in, per his daughter's report he is having difficulty getting it to fit, it feels loose, started feeling that way yesterday. Pt does bring in one green sock (3-ply) and one yellow sock (1-ply) to this session.  Pt accompanied by: family Doctor, hospital; IT sales professional  PERTINENT HISTORY: HTN, DM2, CKD, prostate cancer undergoing radiation treatment and androgen deprivation therapy  PAIN:  Are you having pain? No  PRECAUTIONS: Fall; pt is having radiation therapy M-F in the mornings  WEIGHT BEARING RESTRICTIONS: No  FALLS: Has patient fallen in last 6 months? Yes. Number of falls 2-his did not have prosthetic donned, thought his foot was there and tried to step and went down  LIVING ENVIRONMENT: Lives with: lives with their family and lives with their spouse-2 adult children, 1 son-in-law, 3 infant children Lives in: House/apartment Home Access: Stairs to enter Home layout: One  level Stairs: Yes: External: 4 steps; bilateral but cannot reach both Has following equipment at home: Dan Humphreys - 2 wheeled, Wheelchair (manual), and shower chair  PLOF: Independent with household mobility with device, Independent with transfers, Requires assistive device for independence, and Needs assistance with homemaking  PATIENT GOALS: "To walk better and learn to use the prosthetic."  OBJECTIVE:   DIAGNOSTIC FINDINGS: No recent relevant  findings.  COGNITION: Overall cognitive status: Within functional limits for tasks assessed   SENSATION: Light touch: WFL  POSTURE: No Significant postural limitations; pt has right mass superior to olecranon (per MD notes related to gout)  LOWER EXTREMITY ROM:  Active ROM Right eval Left eval  Hip flexion WNL WFL  Hip extension    Hip abduction    Hip adduction    Hip internal rotation    Hip external rotation    Knee flexion    Knee extension    Ankle dorsiflexion    Ankle plantarflexion    Ankle inversion    Ankle eversion     (Blank rows = not tested)  LOWER EXTREMITY MMT:  MMT Right eval Left eval  Hip flexion 4+/5 4/5  Hip extension    Hip abduction 4/5 3+/5  Hip adduction 5/5 3/5  Hip internal rotation    Hip external rotation    Knee flexion 5/5   Knee extension 5/5   Ankle dorsiflexion 5/5   Ankle plantarflexion    Ankle inversion    Ankle eversion    (Blank rows = not tested)  BED MOBILITY:  Sit to supine Complete Independence Supine to sit Complete Independence Rolling to Right Complete Independence Rolling to Left Complete Independence  TRANSFERS: Sit to stand: Complete Independence Stand to sit: Complete Independence Floor transfers: daughter states family assisted when pt fell to floor  GAIT: Gait pattern: step to pattern, decreased hip/knee flexion- Left, and trunk flexed Distance walked: 405' + various clinic distances Assistive device utilized:  standard no wheel walker Level of assistance: SBA and CGA Gait velocity: 1.18 ft/sec Comments: Pt traverses too far forward into walker w/ intermittent misplacement of prosthetic causing toe catch x2.  He advances standard walker too far forward during each stride resulting in large step-to stride pattern.  FUNCTIONAL TESTS:  5 times sit to stand: 16.45 seconds w/ BUE support-transfers UE support to RW, needed restart x1 due to instability initially 6 minute walk test: 405' w/ standard  walker 10 meter walk test: 28.07 seconds w/ standard walker = 0.36 m/sec OR 1.18 ft/sec Berg Balance Scale: To be assessed.  CURRENT PROSTHETIC WEAR ASSESSMENT: Patient is independent with: residual limb care, care of non-amputated limb, prosthetic cleaning, and ply sock cleaning Patient is dependent with: skin check, correct ply sock adjustment, proper wear schedule/adjustment, and proper weight-bearing schedule/adjustment Donning prosthesis: Complete Independence Doffing prosthesis: Complete Independence Prosthetic wear tolerance: 8 hours inconsistently/day, 7 days/week  Prosthetic weight bearing tolerance: 1 hour Edema: no visible swelling on eval, has stopped wearing shrinker. Residual limb condition: Sweat/moist, clean, well-shaped, mild invagination of scar that is well healed, less hair than proximal leg, warm. Prosthetic description:  ischial containment suction socket w/ flexible inner liner w/ polycentric 4-bar knee w/ pneumatic swing control and fixed ankle SACH foot K code/activity level with prosthetic use: Level 3   TODAY'S TREATMENT:       TherAct/Gait When pt enters clinic his LLE prosthetic is internally rotated and pt reports it feels "loose". Had pt add in one green sock, ambulation x 115 ft  but reports that it stil feels "loose". Had pt add in one yellow sock on top of the green sock. He reports that it feels much better but with ambulation it does become loose again. Deferred any further standing activity this session due to poor fit of prosthetic. Encouraged pt to add in one more yellow sock at home and assess fit as well as bring all the socks he has to next session in case they are needed.  TherEx SciFit multi-peaks level 3 for 8 minutes using BUE/BLEs for neural priming for reciprocal movement, dynamic cardiovascular warmup and increased amplitude of stepping.    PATIENT EDUCATION: Education details: Continue HEP and walking program, trial various sock  combinations to determine best fit Person educated: Patient and Child(ren) Education method: Explanation Education comprehension: verbalized understanding and needs further education  HOME EXERCISE PROGRAM: Access Code: [email or web address] URL: [email or web address] Date: 07/15/2022 Prepared by: Peter Congo  Exercises - Prone Hip Flexor Stretch with Towel Roll (AKA)  - 1 x daily - 7 x weekly - 1 sets - 1 reps - 10 minutes hold - Standing Hip Flexion March  - 1 x daily - 7 x weekly - 3 sets - 10 reps - Standing Hip Abduction  - 1 x daily - 7 x weekly - 3 sets - 10 reps - Standing Hip Extension with Chair  - 1 x daily - 7 x weekly - 3 sets - 10 reps - Sidelying Hip Abduction (AKA)  - 1 x daily - 7 x weekly - 3 sets - 10 reps  AMPUTEE SINK HEP (Provided to pt and daughter in [place] and spanish 5/16) Haz cada ejercicio 1-2 veces al da. Haz cada ejercicio 10 repeticiones. Mantenga cada ejercicio durante 2 segundos para sentir su ubicacin.  EN EL FREGADERO ENCUENTRE SU POSICIN EN LA LNEA MEDIA Y COLOQUE LOS PIES A LA IGUAL DISTANCIA DE LA LNEA MEDIA. Trate de encontrar esta posicin cuando est quieto para Arts development officer.  UTILICE CINTA EN EL PISO PARA MARCAR LA POSICIN DE LA LNEA MEDIA. Tambin debe intentar sentir con la extremidad la presin en la cavidad. Ests tratando de sentir con las extremidades lo que solas sentir con la planta del pie.  1. Desplazamiento de lado a lado: moviendo solo las caderas (no los hombros): [employer] el peso hacia la pierna izquierda, MANTENER/SENTIR. Vuelva a colocar el mismo peso en cada pierna, MANTENER/SENTIR. Mueva el peso sobre su pierna derecha, MANTENER/SENTIR. Vuelva a colocar el mismo peso en cada pierna, MANTENER/SENTIR. Repetir. Comience con ambas manos en el lavabo, avance solo hacia la derecha y luego sin manos. 2. Cambio de adelante hacia atrs: moviendo solo las caderas (no los hombros): [employer] el peso hacia adelante [employer]  dedos de los pies, MANTENER/SENTIR. Mueva su peso hacia atrs para igualar el pie plano en ambas piernas, MANTENER/SENTIR. Mueva su peso nuevamente sobre sus talones, MANTENER/SENTIR. Mueva su peso hacia atrs para igualarlo en ambas piernas, MANTENER/SENTIR. Repetir. comience con ambas manos en el fregadero, avance solo [employer] mano derecha, luego sin [place]. 3. Conos/copas en movimiento: Con el mismo peso en cada pierna: Sujtese con una mano la primera vez, luego avance hasta no apoyar las manos. Mueva las tazas de un lado del fregadero al otro. Coloque los vasos a ~2" fuera de su alcance, avance hasta 10" ms all de su alcance. Coloque una mano en el medio del fregadero y extienda la mano con la otra. Haz ambos brazos. Luego coloque una mano y [employer] tazas con  la otra. 4. Alcanzar hacia arriba/hacia Tomasita Crumble: se alterna el alcance hasta los gabinetes superiores o el techo si no hay gabinetes presentes. Mantenga el mismo peso en cada pierna. Comience con Edison Simon apoyada en el mostrador mientras la otra mano se extiende y avance hasta no apoyar la mano al Barista. Coloque una mano en el medio del fregadero y extindala con la [place]. Haz ambos brazos. Luego coloque una mano y [place] las tazas con la otra. 5. Mirando por encima de los hombros: Con el mismo peso en cada pierna: gire alternativamente para mirar por encima de los hombros con una mano apoyada en el mostrador segn sea necesario. Comience con movimientos de la [place] solo para mirar delante del hombro, luego incluso con el hombro y Programme researcher, broadcasting/film/video mirar detrs de usted. Para mirar hacia un lado, mueva la cabeza/los ojos, luego el hombro del costado que mira [place], cambie ms peso hacia el costado y tire de la cadera Risk manager. Coloca una mano en el medio del fregadero y suelta la otra para que tu hombro pueda retroceder. Cambia de mano para mirar hacia otro lado. Luego coloque una mano y [place] las tazas con la otra. 6. Al pisar con una  pierna que no est amputada: retire de su camino los artculos que se encuentran debajo del gabinete. Mueva las caderas/pelvis para que el peso recaiga sobre la prtesis. PASE [place]ENT[place] la otra pierna de modo que la parte delantera del pie quede dentro del gabinete. Luego retroceda al piso.   Walking Program Using RW  (Por favor use su andador.): [employer] caminar durante un cierto perodo de Pharmacist, community                          Camine 10 minutos 1 vez al da.             Aumentar 2 minutos cada 7 das              Trabaje hasta 20 minutos seguidos (1 vez al da).               Ejemplo:                         Da 1-2           4-5 minutos     3 veces al da                         Da 7-8           10-12 minutos 2-3 veces al da                         Da 13-14       20-22 minutos 1-2 veces al da  ASSESSMENT:  CLINICAL IMPRESSION: Session limited by patient's late arrival and poor fit of prosthetic with limited socks available during session. Emphasis of skilled PT session on trialing various configurations of socks in prosthetic to improve fit as well as educating patient and his daughter about change in residual limb size over time and purpose of socks to improve fit of prosthetic. As pt only brings two socks to this session unable to get a good fit of his prosthetic. Pt to work with socks at home to improve fit and bring all socks to next session to ensure a good fit and  ability to fully participate in session. Continue POC.   OBJECTIVE IMPAIRMENTS: Abnormal gait, decreased activity tolerance, decreased balance, decreased coordination, decreased endurance, decreased knowledge of use of DME, decreased mobility, difficulty walking, decreased ROM, decreased strength, decreased safety awareness, increased edema, improper body mechanics, and prosthetic dependency .   ACTIVITY LIMITATIONS: carrying, lifting, bending, standing, squatting, stairs, transfers, locomotion level, and caring for  others  PARTICIPATION LIMITATIONS: meal prep, cleaning, laundry, driving, shopping, community activity, and yard work  PERSONAL FACTORS: Age, Education, Fitness, Past/current experiences, Transportation, and 3+ comorbidities: HTN, CKD, DM2, and prostate cancer w/ ongoing radiation  are also affecting patient's functional outcome.   REHAB POTENTIAL: Good  CLINICAL DECISION MAKING: Evolving/moderate complexity  EVALUATION COMPLEXITY: Moderate   GOALS: Goals reviewed with patient? Yes  SHORT TERM GOALS: Target date: 08/09/2022  Pt will be independent and compliant with initial strength and balance HEP to improve functional mobility and prosthetic management. Baseline:  To be established. Goal status: MET  2.  Pt will decrease 5xSTS to </=13 seconds w/o UE support in order to demonstrate decreased risk for falls and improved functional bilateral LE strength and power. Baseline: 16.45 sec w/ BUE support, 14.63 sec with one UE support (5/30) Goal status: IN PROGRESS  3.  Pt will ambulate >/=605 feet on to demonstrate improved functional endurance for home and community participation. Baseline:  405' continuously w/ standard walker, 600 ft with RW (5/30) Goal status: IN PROGRESS  4.  Pt will demonstrate a gait speed of >/=1.48 feet/sec in order to decrease risk for falls. Baseline: 1.18 ft/sec w/ standard walker, 1.98 ft/sec with RW (5/30) Goal status: MET  5.  Pt will improve Berg score to 29/56 for decreased fall risk Baseline:  24/56 (5/6), 30/56 (5/30) Goal status: MET  LONG TERM GOALS: Target date: 09/06/2022  Patient to maintain established walking program x3 days per week to improve cardiovascular tolerance and functional mobility w/ prosthesis and LRAD as needed. Baseline:  To be established. Goal status: INITIAL  2.  Pt will ambulate >/=805 feet on to demonstrate improved functional endurance for home and community participation. Baseline: 405' continuously w/  standard walker, 600 ft with RW (5/30) Goal status: INITIAL  3.  Pt will demonstrate a gait speed of >/=2.0 feet/sec in order to decrease risk for falls. Baseline: 1.18 ft/sec w/ standard walker, 1.98 ft/sec with RW (5/30) Goal status: REVISED/UPGRADED  4.  Pt will improve Berg score to 34/56 for decreased fall risk Baseline:  24/56 (5/6), 30/56 (5/30) Goal status: INITIAL  5.  Pt will ambulate >/=250 feet over level surfaces using quad cane for improved access to home environment. Baseline: To be initiated. Goal status: INITIAL  6.  Pt will navigate 4 stairs SBA using single rail and cane as necessary to improve entry to home environment. Baseline:  To be assessed. Goal status: INITIAL   PLAN:  PT FREQUENCY: 2x/week  PT DURATION: 8 weeks  PLANNED INTERVENTIONS: Therapeutic exercises, Therapeutic activity, Neuromuscular re-education, Balance training, Gait training, Patient/Family education, Self Care, Joint mobilization, Stair training, Vestibular training, Prosthetic training, DME instructions, Manual therapy, and Re-evaluation  PLAN FOR NEXT SESSION: Assess left BP-did he take meds?  functional hip strengthening-hip flexion to prevent LLE hip abduction especially when walking, walking program-translate to [place].  Gait training w/ walker vs 4-prong cane.  Working towards quad cane for level ground and stairs., stairs with handrails first, LLE strengthening. How is the redness on his L upper thigh? Did he bring all  of his socks to session and how is the fit of his prosthetic today? Work on walking and balance if pt has good fit of prosthetic  Peter Congo, PT, DPT, CSRS 08/22/2022, 2:49 PM

## 2022-08-26 ENCOUNTER — Encounter: Payer: 59 | Admitting: Physical Therapy

## 2022-08-26 ENCOUNTER — Ambulatory Visit: Payer: 59 | Admitting: Physical Therapy

## 2022-08-26 DIAGNOSIS — M6281 Muscle weakness (generalized): Secondary | ICD-10-CM

## 2022-08-26 DIAGNOSIS — R2689 Other abnormalities of gait and mobility: Secondary | ICD-10-CM

## 2022-08-26 DIAGNOSIS — R2681 Unsteadiness on feet: Secondary | ICD-10-CM | POA: Diagnosis not present

## 2022-08-26 NOTE — Therapy (Signed)
OUTPATIENT PHYSICAL THERAPY PROSTHETICS TREATMENT   Patient Name: Tim Vincent MRN: [id number] DOB:06-25-1955, 67 y.o., male Today's Date: 08/26/2022  PCP: Filomena Jungling, NP REFERRING PROVIDER: Nadara Mustard, MD    END OF SESSION:  PT End of Session - 08/26/22 1404     Visit Number 13    Number of Visits 17   16 + eval   Date for PT Re-Evaluation 09/13/22   pushed out due to frequency and scheduling conflicts w/ radiation therapy   Authorization Type [employer]ICARE    Progress Note Due on Visit 10    PT Start Time 1402   pt arrived late   PT Stop Time 1444    PT Time Calculation (min) 42 min    Equipment Utilized During Treatment Gait belt    Activity Tolerance Patient tolerated treatment well    Behavior During Therapy WFL for tasks assessed/performed                     Past Medical History:  Diagnosis Date   Diabetes mellitus without complication (HCC)    Hypertension    Past Surgical History:  Procedure Laterality Date   AMPUTATION Left 04/17/2022   Procedure: LEFT ABOVE KNEE AMPUTATION;  Surgeon: Nadara Mustard, MD;  Location: [facility];  Service: Orthopedics;  Laterality: Left;   MASS EXCISION Left 04/17/2021   Procedure: EXCISION LEFT ELBOW MASS;  Surgeon: Allena Napoleon, MD;  Location: [facility];  Service: Plastics;  Laterality: Left;  1 hour   MASS EXCISION Left 08/10/2021   Procedure: EXCISION ELBOW MASS;  Surgeon: Allena Napoleon, MD;  Location: [facility];  Service: Plastics;  Laterality: Left;  1 hour   Patient Active Problem List   Diagnosis Date Noted   Diabetic infection of left foot (HCC) 04/17/2022   Necrotizing fasciitis of lower leg (HCC) 04/17/2022   Dyslipidemia 04/17/2022   Essential hypertension 01/12/2021   Obesity 01/12/2021   Skin sensation disturbance 01/12/2021   Type 2 diabetes mellitus without complication (HCC) 01/12/2021   Diabetic neuropathy (HCC) 01/12/2021   Hav (hallux abducto  valgus), unspecified laterality 01/12/2021   Cough 10/31/2017   Elevated blood pressure reading 10/31/2017    ONSET DATE: 04/17/2022 (date of amputation)  REFERRING DIAG: W09.811 (ICD-10-CM) - S/P above knee amputation, left (HCC)  THERAPY DIAG:  Other abnormalities of gait and mobility  Muscle weakness (generalized)  Unsteadiness on feet  Rationale for Evaluation and Treatment: Rehabilitation  SUBJECTIVE:   SUBJECTIVE STATEMENT: Pt has 2 green and one yellow sock on today, feels like his prosthetic limb is fitting tighter. Pt reports "very little" issues with limb turning in while walking. Pt reports the redness on his L upper thigh is healing.  Pt accompanied by: self and Spanish-language interpreter Hoover Brunette  PERTINENT HISTORY: HTN, DM2, CKD, prostate cancer undergoing radiation treatment and androgen deprivation therapy  PAIN:  Are you having pain? No  PRECAUTIONS: Fall; pt is having radiation therapy M-F in the mornings  WEIGHT BEARING RESTRICTIONS: No  FALLS: Has patient fallen in last 6 months? Yes. Number of falls 2-his did not have prosthetic donned, thought his foot was there and tried to step and went down  LIVING ENVIRONMENT: Lives with: lives with their family and lives with their spouse-2 adult children, 1 son-in-law, 3 infant children Lives in: House/apartment Home Access: Stairs to enter Home layout: One level Stairs: Yes: External: 4 steps; bilateral but cannot reach both Has following  equipment at home: Dan Humphreys - 2 wheeled, Wheelchair (manual), and shower chair  PLOF: Independent with household mobility with device, Independent with transfers, Requires assistive device for independence, and Needs assistance with homemaking  PATIENT GOALS: "To walk better and learn to use the prosthetic."  OBJECTIVE:   DIAGNOSTIC FINDINGS: No recent relevant findings.  COGNITION: Overall cognitive status: Within functional limits for tasks  assessed   SENSATION: Light touch: WFL  POSTURE: No Significant postural limitations; pt has right mass superior to olecranon (per MD notes related to gout)  LOWER EXTREMITY ROM:  Active ROM Right eval Left eval  Hip flexion WNL WFL  Hip extension    Hip abduction    Hip adduction    Hip internal rotation    Hip external rotation    Knee flexion    Knee extension    Ankle dorsiflexion    Ankle plantarflexion    Ankle inversion    Ankle eversion     (Blank rows = not tested)  LOWER EXTREMITY MMT:  MMT Right eval Left eval  Hip flexion 4+/5 4/5  Hip extension    Hip abduction 4/5 3+/5  Hip adduction 5/5 3/5  Hip internal rotation    Hip external rotation    Knee flexion 5/5   Knee extension 5/5   Ankle dorsiflexion 5/5   Ankle plantarflexion    Ankle inversion    Ankle eversion    (Blank rows = not tested)  BED MOBILITY:  Sit to supine Complete Independence Supine to sit Complete Independence Rolling to Right Complete Independence Rolling to Left Complete Independence  TRANSFERS: Sit to stand: Complete Independence Stand to sit: Complete Independence Floor transfers: daughter states family assisted when pt fell to floor  GAIT: Gait pattern: step to pattern, decreased hip/knee flexion- Left, and trunk flexed Distance walked: 405' + various clinic distances Assistive device utilized:  standard no wheel walker Level of assistance: SBA and CGA Gait velocity: 1.18 ft/sec Comments: Pt traverses too far forward into walker w/ intermittent misplacement of prosthetic causing toe catch x2.  He advances standard walker too far forward during each stride resulting in large step-to stride pattern.  FUNCTIONAL TESTS:  5 times sit to stand: 16.45 seconds w/ BUE support-transfers UE support to RW, needed restart x1 due to instability initially 6 minute walk test: 405' w/ standard walker 10 meter walk test: 28.07 seconds w/ standard walker = 0.36 m/sec OR 1.18  ft/sec Berg Balance Scale: To be assessed.  CURRENT PROSTHETIC WEAR ASSESSMENT: Patient is independent with: residual limb care, care of non-amputated limb, prosthetic cleaning, and ply sock cleaning Patient is dependent with: skin check, correct ply sock adjustment, proper wear schedule/adjustment, and proper weight-bearing schedule/adjustment Donning prosthesis: Complete Independence Doffing prosthesis: Complete Independence Prosthetic wear tolerance: 8 hours inconsistently/day, 7 days/week  Prosthetic weight bearing tolerance: 1 hour Edema: no visible swelling on eval, has stopped wearing shrinker. Residual limb condition: Sweat/moist, clean, well-shaped, mild invagination of scar that is well healed, less hair than proximal leg, warm. Prosthetic description:  ischial containment suction socket w/ flexible inner liner w/ polycentric 4-bar knee w/ pneumatic swing control and fixed ankle SACH foot K code/activity level with prosthetic use: Level 3   TODAY'S TREATMENT:       TherAct/Gait Gait across uneven blue mat with RW 4 x 10 ft and static stance on airex 4 x 30 sec each (no UE) and CGA Pt with most difficulty performing static stance on airex  In // bars to work  on improving SLS stability: Gait across uneven blue mat with alt L/R gumdrop taps with 2 UE support and CGA, 2 x 10 ft Progressed to just one UE support and min A, 2 x 10 ft  Static stance on airex performing alt L/R gumdrop taps with one UE support and CGA in // bars 3 x 10 reps with most difficulty standing on LLE as compared to RLE  Static stance performing ball toss 2 x 15 reps with CGA Added in static stance on airex performing ball toss 3 x 15 reps with min A    PATIENT EDUCATION: Education details: Continue HEP and walking program, continue to trial various sock combinations to determine best fit Person educated: Patient Education method: Explanation Education comprehension: verbalized understanding and needs  further education  HOME EXERCISE PROGRAM: Access Code: [email or web address] URL: [email or web address] Date: 07/15/2022 Prepared by: Peter Congo  Exercises - Prone Hip Flexor Stretch with Towel Roll (AKA)  - 1 x daily - 7 x weekly - 1 sets - 1 reps - 10 minutes hold - Standing Hip Flexion March  - 1 x daily - 7 x weekly - 3 sets - 10 reps - Standing Hip Abduction  - 1 x daily - 7 x weekly - 3 sets - 10 reps - Standing Hip Extension with Chair  - 1 x daily - 7 x weekly - 3 sets - 10 reps - Sidelying Hip Abduction (AKA)  - 1 x daily - 7 x weekly - 3 sets - 10 reps  AMPUTEE SINK HEP (Provided to pt and daughter in [place] and spanish 5/16) Haz cada ejercicio 1-2 veces al da. Haz cada ejercicio 10 repeticiones. Mantenga cada ejercicio durante 2 segundos para sentir su ubicacin.  EN EL FREGADERO ENCUENTRE SU POSICIN EN LA LNEA MEDIA Y COLOQUE LOS PIES A LA IGUAL DISTANCIA DE LA LNEA MEDIA. Trate de encontrar esta posicin cuando est quieto para Arts development officer.  UTILICE CINTA EN EL PISO PARA MARCAR LA POSICIN DE LA LNEA MEDIA. Tambin debe intentar sentir con la extremidad la presin en la cavidad. Ests tratando de sentir con las extremidades lo que solas sentir con la planta del pie.  1. Desplazamiento de lado a lado: moviendo solo las caderas (no los hombros): [employer] el peso hacia la pierna izquierda, MANTENER/SENTIR. Vuelva a colocar el mismo peso en cada pierna, MANTENER/SENTIR. Mueva el peso sobre su pierna derecha, MANTENER/SENTIR. Vuelva a colocar el mismo peso en cada pierna, MANTENER/SENTIR. Repetir. Comience con ambas manos en el lavabo, avance solo hacia la derecha y luego sin manos. 2. Cambio de adelante hacia atrs: moviendo solo las caderas (no los hombros): [employer] el peso hacia adelante [employer] dedos de los pies, MANTENER/SENTIR. Mueva su peso hacia atrs para igualar el pie plano en ambas piernas, MANTENER/SENTIR. Mueva su peso nuevamente sobre sus talones,  MANTENER/SENTIR. Mueva su peso hacia atrs para igualarlo en ambas piernas, MANTENER/SENTIR. Repetir. comience con ambas manos en el fregadero, avance solo [employer] mano derecha, luego sin Pateros. 3. Conos/copas en movimiento: Con el mismo peso en cada pierna: Sujtese con una mano la primera vez, luego avance hasta no apoyar las manos. Mueva las tazas de un lado del fregadero al otro. Coloque los vasos a ~2" fuera de su alcance, avance hasta 10" ms all de su alcance. Coloque una mano en el medio del fregadero y extienda la mano con la otra. Haz ambos brazos. Luego coloque una mano y [place] las tazas con la otra. 4. Alcanzar hacia arriba/hacia  Tomasita Crumble: se alterna el alcance [employer] gabinetes superiores o el techo si no hay gabinetes presentes. Mantenga el mismo peso en cada pierna. Comience con Edison Simon apoyada en el mostrador mientras la otra mano se extiende y avance hasta no apoyar la mano al Barista. Coloque una mano en el medio del fregadero y extindala con la [place]. Haz ambos brazos. Luego coloque una mano y [place] las tazas con la otra. 5. Mirando por encima de los hombros: Con el mismo peso en cada pierna: gire alternativamente para mirar por encima de los hombros con una mano apoyada en el mostrador segn sea necesario. Comience con movimientos de la [place] solo para mirar delante del hombro, luego incluso con el hombro y Programme researcher, broadcasting/film/video mirar detrs de usted. Para mirar hacia un lado, mueva la cabeza/los ojos, luego el hombro del costado que mira Agency, cambie ms peso hacia el costado y tire de la cadera Risk manager. Coloca una mano en el medio del fregadero y suelta la otra para que tu hombro pueda retroceder. Cambia de mano para mirar hacia otro lado. Luego coloque una mano y [place] las tazas con la otra. 6. Al pisar con una pierna que no est amputada: retire de su camino los artculos que se encuentran debajo del gabinete. Mueva las caderas/pelvis para que el peso recaiga sobre la  prtesis. PASE LENTAMENTE la otra pierna de modo que la parte delantera del pie quede dentro del gabinete. Luego retroceda al piso.   Walking Program Using RW  (Por favor use su andador.): [employer] caminar durante un cierto perodo de Pharmacist, community                          Camine 10 minutos 1 vez al da.             Aumentar 2 minutos cada 7 das              Trabaje hasta 20 minutos seguidos (1 vez al da).               Ejemplo:                         Da 1-2           4-5 minutos     3 veces al da                         Da 7-8           10-12 minutos 2-3 veces al da                         Da 13-14       20-22 minutos 1-2 veces al da  ASSESSMENT:  CLINICAL IMPRESSION: Emphasis of skilled PT session on working on static and dynamic balance exercises and SLS stability. Pt exhibits most difficulty with SLS on his LLE (prosthetic) and with static stance on compliant surfaces. Pt continues to benefit from skilled therapy services to work towards improving his balance, decreasing his fall risk, and to continue with amputee education. Continue POC.   OBJECTIVE IMPAIRMENTS: Abnormal gait, decreased activity tolerance, decreased balance, decreased coordination, decreased endurance, decreased knowledge of use of DME, decreased mobility, difficulty walking, decreased ROM, decreased strength, decreased safety awareness, increased edema, improper body mechanics, and prosthetic dependency .   ACTIVITY LIMITATIONS: carrying,  lifting, bending, standing, squatting, stairs, transfers, locomotion level, and caring for others  PARTICIPATION LIMITATIONS: meal prep, cleaning, laundry, driving, shopping, community activity, and yard work  PERSONAL FACTORS: Age, Education, Fitness, Past/current experiences, Transportation, and 3+ comorbidities: HTN, CKD, DM2, and prostate cancer w/ ongoing radiation  are also affecting patient's functional outcome.   REHAB POTENTIAL: Good  CLINICAL DECISION MAKING:  Evolving/moderate complexity  EVALUATION COMPLEXITY: Moderate   GOALS: Goals reviewed with patient? Yes  SHORT TERM GOALS: Target date: 08/09/2022  Pt will be independent and compliant with initial strength and balance HEP to improve functional mobility and prosthetic management. Baseline:  To be established. Goal status: MET  2.  Pt will decrease 5xSTS to </=13 seconds w/o UE support in order to demonstrate decreased risk for falls and improved functional bilateral LE strength and power. Baseline: 16.45 sec w/ BUE support, 14.63 sec with one UE support (5/30) Goal status: IN PROGRESS  3.  Pt will ambulate >/=605 feet on to demonstrate improved functional endurance for home and community participation. Baseline:  405' continuously w/ standard walker, 600 ft with RW (5/30) Goal status: IN PROGRESS  4.  Pt will demonstrate a gait speed of >/=1.48 feet/sec in order to decrease risk for falls. Baseline: 1.18 ft/sec w/ standard walker, 1.98 ft/sec with RW (5/30) Goal status: MET  5.  Pt will improve Berg score to 29/56 for decreased fall risk Baseline:  24/56 (5/6), 30/56 (5/30) Goal status: MET  LONG TERM GOALS: Target date: 09/06/2022  Patient to maintain established walking program x3 days per week to improve cardiovascular tolerance and functional mobility w/ prosthesis and LRAD as needed. Baseline:  To be established. Goal status: INITIAL  2.  Pt will ambulate >/=805 feet on to demonstrate improved functional endurance for home and community participation. Baseline: 405' continuously w/ standard walker, 600 ft with RW (5/30) Goal status: INITIAL  3.  Pt will demonstrate a gait speed of >/=2.0 feet/sec in order to decrease risk for falls. Baseline: 1.18 ft/sec w/ standard walker, 1.98 ft/sec with RW (5/30) Goal status: REVISED/UPGRADED  4.  Pt will improve Berg score to 34/56 for decreased fall risk Baseline:  24/56 (5/6), 30/56 (5/30) Goal status: INITIAL  5.   Pt will ambulate >/=250 feet over level surfaces using quad cane for improved access to home environment. Baseline: To be initiated. Goal status: INITIAL  6.  Pt will navigate 4 stairs SBA using single rail and cane as necessary to improve entry to home environment. Baseline:  To be assessed. Goal status: INITIAL   PLAN:  PT FREQUENCY: 2x/week  PT DURATION: 8 weeks  PLANNED INTERVENTIONS: Therapeutic exercises, Therapeutic activity, Neuromuscular re-education, Balance training, Gait training, Patient/Family education, Self Care, Joint mobilization, Stair training, Vestibular training, Prosthetic training, DME instructions, Manual therapy, and Re-evaluation  PLAN FOR NEXT SESSION: Assess left BP-did he take meds?  functional hip strengthening-hip flexion to prevent LLE hip abduction especially when walking, walking program-translate to [place].  Gait training w/ walker vs 4-prong cane.  Working towards quad cane for level ground and stairs., stairs with handrails first, LLE strengthening. How is the redness on his L upper thigh? Did he bring all of his socks to session and how is the fit of his prosthetic today? Dynamic balance with gait (obstacles) and static stance on compliant surfaces  Peter Congo, PT, DPT, CSRS 08/26/2022, 2:45 PM

## 2022-08-27 ENCOUNTER — Telehealth: Payer: Self-pay | Admitting: *Deleted

## 2022-08-27 NOTE — Telephone Encounter (Signed)
Attempted to reach patient to schedule surgery by phone and Mychart message.

## 2022-08-29 ENCOUNTER — Ambulatory Visit: Payer: 59 | Admitting: Physical Therapy

## 2022-08-29 DIAGNOSIS — M6281 Muscle weakness (generalized): Secondary | ICD-10-CM

## 2022-08-29 DIAGNOSIS — R2689 Other abnormalities of gait and mobility: Secondary | ICD-10-CM

## 2022-08-29 DIAGNOSIS — R2681 Unsteadiness on feet: Secondary | ICD-10-CM | POA: Diagnosis not present

## 2022-08-29 NOTE — Therapy (Signed)
OUTPATIENT PHYSICAL THERAPY PROSTHETICS TREATMENT   Patient Name: Tim Vincent MRN: [id number] DOB:May 15, 1955, 67 y.o., male Today's Date: 08/29/2022  PCP: Filomena Jungling, NP REFERRING PROVIDER: Nadara Mustard, MD    END OF SESSION:  PT End of Session - 08/29/22 1408     Visit Number 14    Number of Visits 17   16 + eval   Date for PT Re-Evaluation 09/13/22   pushed out due to frequency and scheduling conflicts w/ radiation therapy   Authorization Type [employer]    Progress Note Due on Visit 10    PT Start Time 1407   pt arrived late   PT Stop Time 1443    PT Time Calculation (min) 36 min    Equipment Utilized During Treatment Gait belt    Activity Tolerance Patient tolerated treatment well    Behavior During Therapy WFL for tasks assessed/performed                      Past Medical History:  Diagnosis Date   Diabetes mellitus without complication (HCC)    Hypertension    Past Surgical History:  Procedure Laterality Date   AMPUTATION Left 04/17/2022   Procedure: LEFT ABOVE KNEE AMPUTATION;  Surgeon: Nadara Mustard, MD;  Location: [facility];  Service: Orthopedics;  Laterality: Left;   MASS EXCISION Left 04/17/2021   Procedure: EXCISION LEFT ELBOW MASS;  Surgeon: Allena Napoleon, MD;  Location: [facility];  Service: Plastics;  Laterality: Left;  1 hour   MASS EXCISION Left 08/10/2021   Procedure: EXCISION ELBOW MASS;  Surgeon: Allena Napoleon, MD;  Location: [facility];  Service: Plastics;  Laterality: Left;  1 hour   Patient Active Problem List   Diagnosis Date Noted   Diabetic infection of left foot (HCC) 04/17/2022   Necrotizing fasciitis of lower leg (HCC) 04/17/2022   Dyslipidemia 04/17/2022   Essential hypertension 01/12/2021   Obesity 01/12/2021   Skin sensation disturbance 01/12/2021   Type 2 diabetes mellitus without complication (HCC) 01/12/2021   Diabetic neuropathy (HCC) 01/12/2021   Hav (hallux  abducto valgus), unspecified laterality 01/12/2021   Cough 10/31/2017   Elevated blood pressure reading 10/31/2017    ONSET DATE: 04/17/2022 (date of amputation)  REFERRING DIAG: W09.811 (ICD-10-CM) - S/P above knee amputation, left (HCC)  THERAPY DIAG:  Other abnormalities of gait and mobility  Muscle weakness (generalized)  Unsteadiness on feet  Rationale for Evaluation and Treatment: Rehabilitation  SUBJECTIVE:   SUBJECTIVE STATEMENT: Pt reports no acute changes since last visit. Pt is wearing 2 green and 1 yellow sock today, reports that his limb continues to fit well. Pt reports his skin is healed on his residual limb. Pt reports he has been doing his HEP and it remains challenging.  Pt accompanied by: self and Spanish-language interpreter Alex  PERTINENT HISTORY: HTN, DM2, CKD, prostate cancer undergoing radiation treatment and androgen deprivation therapy  PAIN:  Are you having pain? No  PRECAUTIONS: Fall; pt is having radiation therapy M-F in the mornings  WEIGHT BEARING RESTRICTIONS: No  FALLS: Has patient fallen in last 6 months? Yes. Number of falls 2-his did not have prosthetic donned, thought his foot was there and tried to step and went down  LIVING ENVIRONMENT: Lives with: lives with their family and lives with their spouse-2 adult children, 1 son-in-law, 3 infant children Lives in: House/apartment Home Access: Stairs to enter Home layout: One level Stairs: Yes: External:  4 steps; bilateral but cannot reach both Has following equipment at home: Dan Humphreys - 2 wheeled, Wheelchair (manual), and shower chair  PLOF: Independent with household mobility with device, Independent with transfers, Requires assistive device for independence, and Needs assistance with homemaking  PATIENT GOALS: "To walk better and learn to use the prosthetic."  OBJECTIVE:   DIAGNOSTIC FINDINGS: No recent relevant findings.  COGNITION: Overall cognitive status: Within functional  limits for tasks assessed   SENSATION: Light touch: WFL  POSTURE: No Significant postural limitations; pt has right mass superior to olecranon (per MD notes related to gout)  LOWER EXTREMITY ROM:  Active ROM Right eval Left eval  Hip flexion WNL WFL  Hip extension    Hip abduction    Hip adduction    Hip internal rotation    Hip external rotation    Knee flexion    Knee extension    Ankle dorsiflexion    Ankle plantarflexion    Ankle inversion    Ankle eversion     (Blank rows = not tested)  LOWER EXTREMITY MMT:  MMT Right eval Left eval  Hip flexion 4+/5 4/5  Hip extension    Hip abduction 4/5 3+/5  Hip adduction 5/5 3/5  Hip internal rotation    Hip external rotation    Knee flexion 5/5   Knee extension 5/5   Ankle dorsiflexion 5/5   Ankle plantarflexion    Ankle inversion    Ankle eversion    (Blank rows = not tested)  BED MOBILITY:  Sit to supine Complete Independence Supine to sit Complete Independence Rolling to Right Complete Independence Rolling to Left Complete Independence  TRANSFERS: Sit to stand: Complete Independence Stand to sit: Complete Independence Floor transfers: daughter states family assisted when pt fell to floor  GAIT: Gait pattern: step to pattern, decreased hip/knee flexion- Left, and trunk flexed Distance walked: 405' + various clinic distances Assistive device utilized:  standard no wheel walker Level of assistance: SBA and CGA Gait velocity: 1.18 ft/sec Comments: Pt traverses too far forward into walker w/ intermittent misplacement of prosthetic causing toe catch x2.  He advances standard walker too far forward during each stride resulting in large step-to stride pattern.  FUNCTIONAL TESTS:  5 times sit to stand: 16.45 seconds w/ BUE support-transfers UE support to RW, needed restart x1 due to instability initially 6 minute walk test: 405' w/ standard walker 10 meter walk test: 28.07 seconds w/ standard walker = 0.36 m/sec  OR 1.18 ft/sec Berg Balance Scale: To be assessed.  CURRENT PROSTHETIC WEAR ASSESSMENT: Patient is independent with: residual limb care, care of non-amputated limb, prosthetic cleaning, and ply sock cleaning Patient is dependent with: skin check, correct ply sock adjustment, proper wear schedule/adjustment, and proper weight-bearing schedule/adjustment Donning prosthesis: Complete Independence Doffing prosthesis: Complete Independence Prosthetic wear tolerance: 8 hours inconsistently/day, 7 days/week  Prosthetic weight bearing tolerance: 1 hour Edema: no visible swelling on eval, has stopped wearing shrinker. Residual limb condition: Sweat/moist, clean, well-shaped, mild invagination of scar that is well healed, less hair than proximal leg, warm. Prosthetic description:  ischial containment suction socket w/ flexible inner liner w/ polycentric 4-bar knee w/ pneumatic swing control and fixed ankle SACH foot K code/activity level with prosthetic use: Level 3   TODAY'S TREATMENT:       TherAct/Gait:  Gait pattern: circumduction- Left and lateral lean- Right Distance walked: various clinic distances Assistive device utilized: Walker - 2 wheeled Level of assistance: Modified independence Comments: with RW during session  Gait pattern: circumduction- Left and lateral lean- Right Distance walked: 115 ft Assistive device utilized: Quad cane small base Level of assistance: CGA Comments: trial gait with SBQC again. Pt does exhibit improved balance with SBQC this session as compared to previous sessions.   Gait across uneven surface (blue mat) with SBQC and min A for balance, increased unsteadiness as compared to gait across level surface, 6 x 10 ft.   Standing with SBQC and min to mod A for standing balance: Tried forward/backward stepping with LLE over foam beam, difficulty flexing knee with hip extension so deferred use of this extremity for this task. Forward/backward stepping with  RLE over foam beam but difficulty clearing beam due to impaired balance. Forward/backward stepping with RLE to flat target on ground x 15 reps Added in landing on R heel x 15 reps Added in weight shift forwards through RLE then back through LLE x 15 reps Forward/backward stepping with LLE to flat target on ground x 15 reps, decreased difficulty with this vs stepping with RLE   Ascend/descend ramp and curb with RW and CGA to min A with focus on safe navigation with use of RW. Pt unsafe to attempt navigating these with [facility] this session, can benefit from continued practice with [facility] in future sessions.    PATIENT EDUCATION: Education details: Continue HEP and walking program, continue to trial various sock combinations to determine best fit, PT POC Person educated: Patient Education method: Explanation Education comprehension: verbalized understanding and needs further education   HOME EXERCISE PROGRAM: Access Code: [email or web address] URL: [email or web address] Date: 07/15/2022 Prepared by: Peter Congo  Exercises - Prone Hip Flexor Stretch with Towel Roll (AKA)  - 1 x daily - 7 x weekly - 1 sets - 1 reps - 10 minutes hold - Standing Hip Flexion March  - 1 x daily - 7 x weekly - 3 sets - 10 reps - Standing Hip Abduction  - 1 x daily - 7 x weekly - 3 sets - 10 reps - Standing Hip Extension with Chair  - 1 x daily - 7 x weekly - 3 sets - 10 reps - Sidelying Hip Abduction (AKA)  - 1 x daily - 7 x weekly - 3 sets - 10 reps  AMPUTEE SINK HEP (Provided to pt and daughter in [place] and spanish 5/16) Haz cada ejercicio 1-2 veces al da. Haz cada ejercicio 10 repeticiones. Mantenga cada ejercicio durante 2 segundos para sentir su ubicacin.  EN EL FREGADERO ENCUENTRE SU POSICIN EN LA LNEA MEDIA Y COLOQUE LOS PIES A LA IGUAL DISTANCIA DE LA LNEA MEDIA. Trate de encontrar esta posicin cuando est quieto para Arts development officer.  UTILICE CINTA EN EL PISO PARA MARCAR LA POSICIN  DE LA LNEA MEDIA. Tambin debe intentar sentir con la extremidad la presin en la cavidad. Ests tratando de sentir con las extremidades lo que solas sentir con la planta del pie.  1. Desplazamiento de lado a lado: moviendo solo las caderas (no los hombros): [employer] el peso hacia la pierna izquierda, MANTENER/SENTIR. Vuelva a colocar el mismo peso en cada pierna, MANTENER/SENTIR. Mueva el peso sobre su pierna derecha, MANTENER/SENTIR. Vuelva a colocar el mismo peso en cada pierna, MANTENER/SENTIR. Repetir. Comience con ambas manos en el lavabo, avance solo hacia la derecha y luego sin manos. 2. Cambio de adelante hacia atrs: moviendo solo las caderas (no los hombros): [employer] el peso hacia adelante [employer] dedos de los pies, MANTENER/SENTIR. Mueva su peso hacia atrs para igualar el pie plano en  ambas piernas, MANTENER/SENTIR. Mueva su peso nuevamente sobre sus talones, MANTENER/SENTIR. Mueva su peso hacia atrs para igualarlo en ambas piernas, MANTENER/SENTIR. Repetir. comience con ambas manos en el fregadero, avance solo [employer] mano derecha, luego sin [place]. 3. Conos/copas en movimiento: Con el mismo peso en cada pierna: Sujtese con una mano la primera vez, luego avance hasta no apoyar las manos. Mueva las tazas de un lado del fregadero al otro. Coloque los vasos a ~2" fuera de su alcance, avance hasta 10" ms all de su alcance. Coloque una mano en el medio del fregadero y extienda la mano con la otra. Haz ambos brazos. Luego coloque una mano y [place] las tazas con la otra. 4. Alcanzar hacia arriba/hacia Tomasita Crumble: se alterna el alcance hasta los gabinetes superiores o el techo si no hay gabinetes presentes. Mantenga el mismo peso en cada pierna. Comience con Edison Simon apoyada en el mostrador mientras la otra mano se extiende y avance hasta no apoyar la mano al [place]. Coloque una mano en el medio del fregadero y extindala con la [place]. Haz ambos brazos. Luego coloque una mano y [place] las tazas con la  otra. 5. Mirando por encima de los hombros: Con el mismo peso en cada pierna: gire alternativamente para mirar por encima de los hombros con una mano apoyada en el mostrador segn sea necesario. Comience con movimientos de la [place] solo para mirar delante del hombro, luego incluso con el hombro y Programme researcher, broadcasting/film/video mirar detrs de usted. Para mirar hacia un lado, mueva la cabeza/los ojos, luego el hombro del costado que mira [place], cambie ms peso hacia el costado y tire de la cadera Risk manager. Coloca una mano en el medio del fregadero y suelta la otra para que tu hombro pueda retroceder. Cambia de mano para mirar hacia otro lado. Luego coloque una mano y [place] las tazas con la otra. 6. Al pisar con una pierna que no est amputada: retire de su camino los artculos que se encuentran debajo del gabinete. Mueva las caderas/pelvis para que el peso recaiga sobre la prtesis. PASE [place] la otra pierna de modo que la parte delantera del pie quede dentro del gabinete. Luego retroceda al piso.   Walking Program Using RW  (Por favor use su andador.): [employer] caminar durante un cierto perodo de Pharmacist, community                          Camine 10 minutos 1 vez al da.             Aumentar 2 minutos cada 7 das              Trabaje hasta 20 minutos seguidos (1 vez al da).               Ejemplo:                         Da 1-2           4-5 minutos     3 veces al da                         Da 7-8           10-12 minutos 2-3 veces al [employer]  Da 13-14       20-22 minutos 1-2 veces al da  ASSESSMENT:  CLINICAL IMPRESSION: Emphasis of skilled PT session on trialing gait again with [facility] and continuing to work on dynamic standing balance with LRAD. Pt does exhibit improved balance with use of SBQC during gait this date as compared to previous sessions. However, pt does require continued practice with this device before he would be safe to progress to using it for community mobility.  Pt continues to benefit from skilled therapy services to work towards LTGs. Continue POC.   OBJECTIVE IMPAIRMENTS: Abnormal gait, decreased activity tolerance, decreased balance, decreased coordination, decreased endurance, decreased knowledge of use of DME, decreased mobility, difficulty walking, decreased ROM, decreased strength, decreased safety awareness, increased edema, improper body mechanics, and prosthetic dependency .   ACTIVITY LIMITATIONS: carrying, lifting, bending, standing, squatting, stairs, transfers, locomotion level, and caring for others  PARTICIPATION LIMITATIONS: meal prep, cleaning, laundry, driving, shopping, community activity, and yard work  PERSONAL FACTORS: Age, Education, Fitness, Past/current experiences, Transportation, and 3+ comorbidities: HTN, CKD, DM2, and prostate cancer w/ ongoing radiation  are also affecting patient's functional outcome.   REHAB POTENTIAL: Good  CLINICAL DECISION MAKING: Evolving/moderate complexity  EVALUATION COMPLEXITY: Moderate   GOALS: Goals reviewed with patient? Yes  SHORT TERM GOALS: Target date: 08/09/2022  Pt will be independent and compliant with initial strength and balance HEP to improve functional mobility and prosthetic management. Baseline:  To be established. Goal status: MET  2.  Pt will decrease 5xSTS to </=13 seconds w/o UE support in order to demonstrate decreased risk for falls and improved functional bilateral LE strength and power. Baseline: 16.45 sec w/ BUE support, 14.63 sec with one UE support (5/30) Goal status: IN PROGRESS  3.  Pt will ambulate >/=605 feet on to demonstrate improved functional endurance for home and community participation. Baseline:  405' continuously w/ standard walker, 600 ft with RW (5/30) Goal status: IN PROGRESS  4.  Pt will demonstrate a gait speed of >/=1.48 feet/sec in order to decrease risk for falls. Baseline: 1.18 ft/sec w/ standard walker, 1.98 ft/sec with RW  (5/30) Goal status: MET  5.  Pt will improve Berg score to 29/56 for decreased fall risk Baseline:  24/56 (5/6), 30/56 (5/30) Goal status: MET  LONG TERM GOALS: Target date: 09/06/2022  Patient to maintain established walking program x3 days per week to improve cardiovascular tolerance and functional mobility w/ prosthesis and LRAD as needed. Baseline:  To be established. Goal status: INITIAL  2.  Pt will ambulate >/=805 feet on to demonstrate improved functional endurance for home and community participation. Baseline: 405' continuously w/ standard walker, 600 ft with RW (5/30) Goal status: INITIAL  3.  Pt will demonstrate a gait speed of >/=2.0 feet/sec in order to decrease risk for falls. Baseline: 1.18 ft/sec w/ standard walker, 1.98 ft/sec with RW (5/30) Goal status: REVISED/UPGRADED  4.  Pt will improve Berg score to 34/56 for decreased fall risk Baseline:  24/56 (5/6), 30/56 (5/30) Goal status: INITIAL  5.  Pt will ambulate >/=250 feet over level surfaces using quad cane for improved access to home environment. Baseline: To be initiated. Goal status: INITIAL  6.  Pt will navigate 4 stairs SBA using single rail and cane as necessary to improve entry to home environment. Baseline:  To be assessed. Goal status: INITIAL   PLAN:  PT FREQUENCY: 2x/week  PT DURATION: 8 weeks  PLANNED INTERVENTIONS: Therapeutic exercises, Therapeutic activity, Neuromuscular re-education, Balance  training, Gait training, Patient/Family education, Self Care, Joint mobilization, Stair training, Vestibular training, Prosthetic training, DME instructions, Manual therapy, and Re-evaluation  PLAN FOR NEXT SESSION: Assess left BP-did he take meds?  functional hip strengthening-hip flexion to prevent LLE hip abduction especially when walking, walking program-translate to [place].  Gait training w/ walker vs 4-prong cane.  Working towards quad cane for level ground and stairs., stairs with  handrails first, LLE strengthening. Dynamic balance with gait (obstacles) and static stance on compliant surfaces, discuss PT POC (recert vs d/c)  Peter Congo, PT, DPT, CSRS 08/29/2022, 2:44 PM

## 2022-09-02 ENCOUNTER — Ambulatory Visit: Payer: 59 | Admitting: Physical Therapy

## 2022-09-02 ENCOUNTER — Encounter: Payer: Self-pay | Admitting: Physical Therapy

## 2022-09-02 DIAGNOSIS — M6281 Muscle weakness (generalized): Secondary | ICD-10-CM

## 2022-09-02 DIAGNOSIS — R2689 Other abnormalities of gait and mobility: Secondary | ICD-10-CM

## 2022-09-02 DIAGNOSIS — R2681 Unsteadiness on feet: Secondary | ICD-10-CM

## 2022-09-02 NOTE — Therapy (Unsigned)
OUTPATIENT PHYSICAL THERAPY PROSTHETICS TREATMENT   Patient Name: Tim Vincent MRN: [id number] DOB:1955-12-06, 67 y.o., male Today's Date: 09/02/2022  PCP: Filomena Jungling, NP REFERRING PROVIDER: Nadara Mustard, MD    END OF SESSION:  PT End of Session - 09/02/22 1410     Visit Number 15    Number of Visits 17   16 + eval   Date for PT Re-Evaluation 09/13/22   pushed out due to frequency and scheduling conflicts w/ radiation therapy   Authorization Type [employer]    Progress Note Due on Visit 10    PT Start Time 1404    PT Stop Time 1445    PT Time Calculation (min) 41 min    Equipment Utilized During Treatment Gait belt    Activity Tolerance Patient tolerated treatment well    Behavior During Therapy WFL for tasks assessed/performed                      Past Medical History:  Diagnosis Date   Diabetes mellitus without complication (HCC)    Hypertension    Past Surgical History:  Procedure Laterality Date   AMPUTATION Left 04/17/2022   Procedure: LEFT ABOVE KNEE AMPUTATION;  Surgeon: Nadara Mustard, MD;  Location: [facility];  Service: Orthopedics;  Laterality: Left;   MASS EXCISION Left 04/17/2021   Procedure: EXCISION LEFT ELBOW MASS;  Surgeon: Allena Napoleon, MD;  Location: [facility];  Service: Plastics;  Laterality: Left;  1 hour   MASS EXCISION Left 08/10/2021   Procedure: EXCISION ELBOW MASS;  Surgeon: Allena Napoleon, MD;  Location: [facility];  Service: Plastics;  Laterality: Left;  1 hour   Patient Active Problem List   Diagnosis Date Noted   Diabetic infection of left foot (HCC) 04/17/2022   Necrotizing fasciitis of lower leg (HCC) 04/17/2022   Dyslipidemia 04/17/2022   Essential hypertension 01/12/2021   Obesity 01/12/2021   Skin sensation disturbance 01/12/2021   Type 2 diabetes mellitus without complication (HCC) 01/12/2021   Diabetic neuropathy (HCC) 01/12/2021   Hav (hallux abducto valgus),  unspecified laterality 01/12/2021   Cough 10/31/2017   Elevated blood pressure reading 10/31/2017    ONSET DATE: 04/17/2022 (date of amputation)  REFERRING DIAG: B14.782 (ICD-10-CM) - S/P above knee amputation, left (HCC)  THERAPY DIAG:  Other abnormalities of gait and mobility  Muscle weakness (generalized)  Unsteadiness on feet  Rationale for Evaluation and Treatment: Rehabilitation  SUBJECTIVE:   SUBJECTIVE STATEMENT: Pt reports no acute changes since last visit and denies falls.  He is walking with the cane at home mostly, but ambulates into appt with RW.  Pt is wearing 2 yellow and 1 green sock today, but reports he feels that layering socks like this causes them to pull out of the seal of the liner.   Pt accompanied by: self and Spanish-language interpreter Raquel  PERTINENT HISTORY: HTN, DM2, CKD, prostate cancer undergoing radiation treatment and androgen deprivation therapy  PAIN:  Are you having pain? No  PRECAUTIONS: Fall; pt is having radiation therapy M-F in the mornings  WEIGHT BEARING RESTRICTIONS: No  FALLS: Has patient fallen in last 6 months? Yes. Number of falls 2-his did not have prosthetic donned, thought his foot was there and tried to step and went down  LIVING ENVIRONMENT: Lives with: lives with their family and lives with their spouse-2 adult children, 1 son-in-law, 3 infant children Lives in: House/apartment Home Access: Stairs to enter  Home layout: One level Stairs: Yes: External: 4 steps; bilateral but cannot reach both Has following equipment at home: Dan Humphreys - 2 wheeled, Wheelchair (manual), and shower chair  PLOF: Independent with household mobility with device, Independent with transfers, Requires assistive device for independence, and Needs assistance with homemaking  PATIENT GOALS: "To walk better and learn to use the prosthetic."  OBJECTIVE:   DIAGNOSTIC FINDINGS: No recent relevant findings.  COGNITION: Overall cognitive status:  Within functional limits for tasks assessed   SENSATION: Light touch: WFL  POSTURE: No Significant postural limitations; pt has right mass superior to olecranon (per MD notes related to gout)  LOWER EXTREMITY ROM:  Active ROM Right eval Left eval  Hip flexion WNL WFL  Hip extension    Hip abduction    Hip adduction    Hip internal rotation    Hip external rotation    Knee flexion    Knee extension    Ankle dorsiflexion    Ankle plantarflexion    Ankle inversion    Ankle eversion     (Blank rows = not tested)  LOWER EXTREMITY MMT:  MMT Right eval Left eval  Hip flexion 4+/5 4/5  Hip extension    Hip abduction 4/5 3+/5  Hip adduction 5/5 3/5  Hip internal rotation    Hip external rotation    Knee flexion 5/5   Knee extension 5/5   Ankle dorsiflexion 5/5   Ankle plantarflexion    Ankle inversion    Ankle eversion    (Blank rows = not tested)  BED MOBILITY:  Sit to supine Complete Independence Supine to sit Complete Independence Rolling to Right Complete Independence Rolling to Left Complete Independence  TRANSFERS: Sit to stand: Complete Independence Stand to sit: Complete Independence Floor transfers: daughter states family assisted when pt fell to floor  GAIT: Gait pattern: step to pattern, decreased hip/knee flexion- Left, and trunk flexed Distance walked: 405' + various clinic distances Assistive device utilized:  standard no wheel walker Level of assistance: SBA and CGA Gait velocity: 1.18 ft/sec Comments: Pt traverses too far forward into walker w/ intermittent misplacement of prosthetic causing toe catch x2.  He advances standard walker too far forward during each stride resulting in large step-to stride pattern.  FUNCTIONAL TESTS:  5 times sit to stand: 16.45 seconds w/ BUE support-transfers UE support to RW, needed restart x1 due to instability initially 6 minute walk test: 405' w/ standard walker 10 meter walk test: 28.07 seconds w/ standard  walker = 0.36 m/sec OR 1.18 ft/sec Berg Balance Scale: To be assessed.  CURRENT PROSTHETIC WEAR ASSESSMENT: Patient is independent with: residual limb care, care of non-amputated limb, prosthetic cleaning, and ply sock cleaning Patient is dependent with: skin check, correct ply sock adjustment, proper wear schedule/adjustment, and proper weight-bearing schedule/adjustment Donning prosthesis: Complete Independence Doffing prosthesis: Complete Independence Prosthetic wear tolerance: 8 hours inconsistently/day, 7 days/week  Prosthetic weight bearing tolerance: 1 hour Edema: no visible swelling on eval, has stopped wearing shrinker. Residual limb condition: Sweat/moist, clean, well-shaped, mild invagination of scar that is well healed, less hair than proximal leg, warm. Prosthetic description:  ischial containment suction socket w/ flexible inner liner w/ polycentric 4-bar knee w/ pneumatic swing control and fixed ankle SACH foot K code/activity level with prosthetic use: Level 3   TODAY'S TREATMENT:      -Lateral stepping 2x10' no resistance > w/ red theraband 2x10' w/ minimal resistance noted by patient > blue theraband 6x10' w/ muscle fatigue noted on last  2 reps -Lateral lunges to RLE w/ blue theraband donned w/ LUE support 3x6 w/ 30 seconds standing rest between sets -Obstacle course performed x2 w/ RW and CGA:  airex step on and 8" hurdle step over, 2-4" hurdles, blue mat walk over w/ crossover floor pickup using bean bags; education on safety for floor pickup and obstacle navigation w/ RW management to prevent placing it far away from body. -Stride stance w/ LLE in rear performing midline progressed to left oriented toss of bean bag to target to increased left weight shift and rotation, mild right lean w/ increased left rotation -Wide stance purple unweighted ball toss to wall in progressed height > left and right > full right rotation variable reps w/ CGA  PATIENT EDUCATION: Education  details:  Discussed contacting [employer] regarding 5-ply socks as pt has none and this may help eliminate the layering issue.  Continue HEP and walking program, continue to trial various sock combinations to determine best fit.  He continues to be okay with plan to discharge and work on progression at home at 6/27 visit. Person educated: Patient Education method: Explanation Education comprehension: verbalized understanding and needs further education   HOME EXERCISE PROGRAM: Access Code: [email or web address] URL: [email or web address] Date: 07/15/2022 Prepared by: Peter Congo  Exercises - Prone Hip Flexor Stretch with Towel Roll (AKA)  - 1 x daily - 7 x weekly - 1 sets - 1 reps - 10 minutes hold - Standing Hip Flexion March  - 1 x daily - 7 x weekly - 3 sets - 10 reps - Standing Hip Abduction  - 1 x daily - 7 x weekly - 3 sets - 10 reps - Standing Hip Extension with Chair  - 1 x daily - 7 x weekly - 3 sets - 10 reps - Sidelying Hip Abduction (AKA)  - 1 x daily - 7 x weekly - 3 sets - 10 reps  AMPUTEE SINK HEP (Provided to pt and daughter in [place] and spanish 5/16) Haz cada ejercicio 1-2 veces al da. Haz cada ejercicio 10 repeticiones. Mantenga cada ejercicio durante 2 segundos para sentir su ubicacin.  EN EL FREGADERO ENCUENTRE SU POSICIN EN LA LNEA MEDIA Y COLOQUE LOS PIES A LA IGUAL DISTANCIA DE LA LNEA MEDIA. Trate de encontrar esta posicin cuando est quieto para Arts development officer.  UTILICE CINTA EN EL PISO PARA MARCAR LA POSICIN DE LA LNEA MEDIA. Tambin debe intentar sentir con la extremidad la presin en la cavidad. Ests tratando de sentir con las extremidades lo que solas sentir con la planta del pie.  1. Desplazamiento de lado a lado: moviendo solo las caderas (no los hombros): [employer] el peso hacia la pierna izquierda, MANTENER/SENTIR. Vuelva a colocar el mismo peso en cada pierna, MANTENER/SENTIR. Mueva el peso sobre su pierna derecha, MANTENER/SENTIR. Vuelva a  colocar el mismo peso en cada pierna, MANTENER/SENTIR. Repetir. Comience con ambas manos en el lavabo, avance solo hacia la derecha y luego sin manos. 2. Cambio de adelante hacia atrs: moviendo solo las caderas (no los hombros): [employer] el peso hacia adelante [employer] dedos de los pies, MANTENER/SENTIR. Mueva su peso hacia atrs para igualar el pie plano en ambas piernas, MANTENER/SENTIR. Mueva su peso nuevamente sobre sus talones, MANTENER/SENTIR. Mueva su peso hacia atrs para igualarlo en ambas piernas, MANTENER/SENTIR. Repetir. comience con ambas manos en el fregadero, avance solo [employer] mano derecha, luego sin [place]. 3. Conos/copas en movimiento: Con el mismo peso en cada pierna: Sujtese con una mano la primera vez, luego  avance hasta no apoyar las manos. Mueva las tazas de un lado del fregadero al otro. Coloque los vasos a ~2" fuera de su alcance, avance hasta 10" ms all de su alcance. Coloque una mano en el medio del fregadero y extienda la mano con la otra. Haz ambos brazos. Luego coloque una mano y [place] las tazas con la otra. 4. Alcanzar hacia arriba/hacia Tomasita Crumble: se alterna el alcance hasta los gabinetes superiores o el techo si no hay gabinetes presentes. Mantenga el mismo peso en cada pierna. Comience con Edison Simon apoyada en el mostrador mientras la otra mano se extiende y avance hasta no apoyar la mano al [place]. Coloque una mano en el medio del fregadero y extindala con la [place]. Haz ambos brazos. Luego coloque una mano y [place] las tazas con la otra. 5. Mirando por encima de los hombros: Con el mismo peso en cada pierna: gire alternativamente para mirar por encima de los hombros con una mano apoyada en el mostrador segn sea necesario. Comience con movimientos de la [place] solo para mirar delante del hombro, luego incluso con el hombro y Programme researcher, broadcasting/film/video mirar detrs de usted. Para mirar hacia un lado, mueva la cabeza/los ojos, luego el hombro del costado que mira [place], cambie ms  peso hacia el costado y tire de la cadera Risk manager. Coloca una mano en el medio del fregadero y suelta la otra para que tu hombro pueda retroceder. Cambia de mano para mirar hacia otro lado. Luego coloque una mano y [place] las tazas con la otra. 6. Al pisar con una pierna que no est amputada: retire de su camino los artculos que se encuentran debajo del gabinete. Mueva las caderas/pelvis para que el peso recaiga sobre la prtesis. PASE LENTAMENTE la otra pierna de modo que la parte delantera del pie quede dentro del gabinete. Luego retroceda al piso.   Walking Program Using RW  (Por favor use su andador.): [employer] caminar durante un cierto perodo de Pharmacist, community                          Camine 10 minutos 1 vez al da.             Aumentar 2 minutos cada 7 das              Trabaje hasta 20 minutos seguidos (1 vez al da).               Ejemplo:                         Da 1-2           4-5 minutos     3 veces al da                         Da 7-8           10-12 minutos 2-3 veces al da                         Da 13-14       20-22 minutos 1-2 veces al da  ASSESSMENT:  CLINICAL IMPRESSION: Focus of skilled session on continued prosthetic management in variable dynamic conditions.  Lunges were limited by prosthetic knee component, but despite this patient was able to vary LE he stepped up and over with on compliant surfaces when  using RW.  Further incorporated weight shift and trunk rotation for improved quality of mobility and static stability.  He is prepared for plan to assess LTGs and discharge to home management at next visit.   OBJECTIVE IMPAIRMENTS: Abnormal gait, decreased activity tolerance, decreased balance, decreased coordination, decreased endurance, decreased knowledge of use of DME, decreased mobility, difficulty walking, decreased ROM, decreased strength, decreased safety awareness, increased edema, improper body mechanics, and prosthetic dependency .   ACTIVITY  LIMITATIONS: carrying, lifting, bending, standing, squatting, stairs, transfers, locomotion level, and caring for others  PARTICIPATION LIMITATIONS: meal prep, cleaning, laundry, driving, shopping, community activity, and yard work  PERSONAL FACTORS: Age, Education, Fitness, Past/current experiences, Transportation, and 3+ comorbidities: HTN, CKD, DM2, and prostate cancer w/ ongoing radiation  are also affecting patient's functional outcome.   REHAB POTENTIAL: Good  CLINICAL DECISION MAKING: Evolving/moderate complexity  EVALUATION COMPLEXITY: Moderate   GOALS: Goals reviewed with patient? Yes  SHORT TERM GOALS: Target date: 08/09/2022  Pt will be independent and compliant with initial strength and balance HEP to improve functional mobility and prosthetic management. Baseline:  To be established. Goal status: MET  2.  Pt will decrease 5xSTS to </=13 seconds w/o UE support in order to demonstrate decreased risk for falls and improved functional bilateral LE strength and power. Baseline: 16.45 sec w/ BUE support, 14.63 sec with one UE support (5/30) Goal status: IN PROGRESS  3.  Pt will ambulate >/=605 feet on to demonstrate improved functional endurance for home and community participation. Baseline:  405' continuously w/ standard walker, 600 ft with RW (5/30) Goal status: IN PROGRESS  4.  Pt will demonstrate a gait speed of >/=1.48 feet/sec in order to decrease risk for falls. Baseline: 1.18 ft/sec w/ standard walker, 1.98 ft/sec with RW (5/30) Goal status: MET  5.  Pt will improve Berg score to 29/56 for decreased fall risk Baseline:  24/56 (5/6), 30/56 (5/30) Goal status: MET  LONG TERM GOALS: Target date: 09/06/2022  Patient to maintain established walking program x3 days per week to improve cardiovascular tolerance and functional mobility w/ prosthesis and LRAD as needed. Baseline:  To be established. Goal status: INITIAL  2.  Pt will ambulate >/=805 feet on to  demonstrate improved functional endurance for home and community participation. Baseline: 405' continuously w/ standard walker, 600 ft with RW (5/30) Goal status: INITIAL  3.  Pt will demonstrate a gait speed of >/=2.0 feet/sec in order to decrease risk for falls. Baseline: 1.18 ft/sec w/ standard walker, 1.98 ft/sec with RW (5/30) Goal status: REVISED/UPGRADED  4.  Pt will improve Berg score to 34/56 for decreased fall risk Baseline:  24/56 (5/6), 30/56 (5/30) Goal status: INITIAL  5.  Pt will ambulate >/=250 feet over level surfaces using quad cane for improved access to home environment. Baseline: To be initiated. Goal status: INITIAL  6.  Pt will navigate 4 stairs SBA using single rail and cane as necessary to improve entry to home environment. Baseline:  To be assessed. Goal status: INITIAL   PLAN:  PT FREQUENCY: 2x/week  PT DURATION: 8 weeks  PLANNED INTERVENTIONS: Therapeutic exercises, Therapeutic activity, Neuromuscular re-education, Balance training, Gait training, Patient/Family education, Self Care, Joint mobilization, Stair training, Vestibular training, Prosthetic training, DME instructions, Manual therapy, and Re-evaluation  PLAN FOR NEXT SESSION: ASSESS LTGs-D/C  Sadie Haber, PT, DPT 09/02/2022, 2:55 PM

## 2022-09-05 ENCOUNTER — Ambulatory Visit: Payer: 59 | Admitting: Physical Therapy

## 2022-09-05 ENCOUNTER — Encounter: Payer: 59 | Admitting: Physical Therapy

## 2022-09-05 DIAGNOSIS — R2689 Other abnormalities of gait and mobility: Secondary | ICD-10-CM

## 2022-09-05 DIAGNOSIS — M6281 Muscle weakness (generalized): Secondary | ICD-10-CM | POA: Diagnosis not present

## 2022-09-05 DIAGNOSIS — R2681 Unsteadiness on feet: Secondary | ICD-10-CM

## 2022-09-05 NOTE — Therapy (Signed)
OUTPATIENT PHYSICAL THERAPY PROSTHETICS TREATMENT   Patient Name: Tim Vincent MRN: [id number] DOB:January 18, 1956, 67 y.o., male Today's Date: 09/05/2022  PCP: Filomena Jungling, NP REFERRING PROVIDER: Nadara Mustard, MD      END OF SESSION:  PT End of Session - 09/05/22 1403     Visit Number 16    Number of Visits 17   16 + eval   Date for PT Re-Evaluation 09/13/22   pushed out due to frequency and scheduling conflicts w/ radiation therapy   Authorization Type [employer]ICARE    Progress Note Due on Visit 10    PT Start Time 1402    PT Stop Time 1442    PT Time Calculation (min) 40 min    Equipment Utilized During Treatment Gait belt    Activity Tolerance Patient tolerated treatment well    Behavior During Therapy WFL for tasks assessed/performed                       Past Medical History:  Diagnosis Date   Diabetes mellitus without complication (HCC)    Hypertension    Past Surgical History:  Procedure Laterality Date   AMPUTATION Left 04/17/2022   Procedure: LEFT ABOVE KNEE AMPUTATION;  Surgeon: Nadara Mustard, MD;  Location: [facility];  Service: Orthopedics;  Laterality: Left;   MASS EXCISION Left 04/17/2021   Procedure: EXCISION LEFT ELBOW MASS;  Surgeon: Allena Napoleon, MD;  Location: [facility];  Service: Plastics;  Laterality: Left;  1 hour   MASS EXCISION Left 08/10/2021   Procedure: EXCISION ELBOW MASS;  Surgeon: Allena Napoleon, MD;  Location: [facility];  Service: Plastics;  Laterality: Left;  1 hour   Patient Active Problem List   Diagnosis Date Noted   Diabetic infection of left foot (HCC) 04/17/2022   Necrotizing fasciitis of lower leg (HCC) 04/17/2022   Dyslipidemia 04/17/2022   Essential hypertension 01/12/2021   Obesity 01/12/2021   Skin sensation disturbance 01/12/2021   Type 2 diabetes mellitus without complication (HCC) 01/12/2021   Diabetic neuropathy (HCC) 01/12/2021   Hav (hallux abducto valgus),  unspecified laterality 01/12/2021   Cough 10/31/2017   Elevated blood pressure reading 10/31/2017    ONSET DATE: 04/17/2022 (date of amputation)  REFERRING DIAG: W09.811 (ICD-10-CM) - S/P above knee amputation, left (HCC)  THERAPY DIAG:  Other abnormalities of gait and mobility  Muscle weakness (generalized)  Unsteadiness on feet  Rationale for Evaluation and Treatment: Rehabilitation  SUBJECTIVE:   SUBJECTIVE STATEMENT: Pt has an appointment with [facility] tomorrow because of the issues with socks. Pt currently wearing 2 green socks and one yellow sock, right now it feels good but once he starts moving the limb starts to feel loose. Pt wants to keep last scheduled appt on Monday so we can see him after he gets socks adjusted.  Pt accompanied by: self and Spanish-language interpreter Milva  PERTINENT HISTORY: HTN, DM2, CKD, prostate cancer undergoing radiation treatment and androgen deprivation therapy  PAIN:  Are you having pain? No  PRECAUTIONS: Fall; pt is having radiation therapy M-F in the mornings  WEIGHT BEARING RESTRICTIONS: No  FALLS: Has patient fallen in last 6 months? Yes. Number of falls 2-his did not have prosthetic donned, thought his foot was there and tried to step and went down  LIVING ENVIRONMENT: Lives with: lives with their family and lives with their spouse-2 adult children, 1 son-in-law, 3 infant children Lives in: House/apartment Home Access: Stairs  to enter Home layout: One level Stairs: Yes: External: 4 steps; bilateral but cannot reach both Has following equipment at home: Dan Humphreys - 2 wheeled, Wheelchair (manual), and shower chair  PLOF: Independent with household mobility with device, Independent with transfers, Requires assistive device for independence, and Needs assistance with homemaking  PATIENT GOALS: "To walk better and learn to use the prosthetic."  OBJECTIVE:   DIAGNOSTIC FINDINGS: No recent relevant findings.  COGNITION: Overall  cognitive status: Within functional limits for tasks assessed   SENSATION: Light touch: WFL  POSTURE: No Significant postural limitations; pt has right mass superior to olecranon (per MD notes related to gout)  LOWER EXTREMITY ROM:  Active ROM Right eval Left eval  Hip flexion WNL WFL  Hip extension    Hip abduction    Hip adduction    Hip internal rotation    Hip external rotation    Knee flexion    Knee extension    Ankle dorsiflexion    Ankle plantarflexion    Ankle inversion    Ankle eversion     (Blank rows = not tested)  LOWER EXTREMITY MMT:  MMT Right eval Left eval  Hip flexion 4+/5 4/5  Hip extension    Hip abduction 4/5 3+/5  Hip adduction 5/5 3/5  Hip internal rotation    Hip external rotation    Knee flexion 5/5   Knee extension 5/5   Ankle dorsiflexion 5/5   Ankle plantarflexion    Ankle inversion    Ankle eversion    (Blank rows = not tested)  BED MOBILITY:  Sit to supine Complete Independence Supine to sit Complete Independence Rolling to Right Complete Independence Rolling to Left Complete Independence  TRANSFERS: Sit to stand: Complete Independence Stand to sit: Complete Independence Floor transfers: daughter states family assisted when pt fell to floor  GAIT: Gait pattern: step to pattern, decreased hip/knee flexion- Left, and trunk flexed Distance walked: 405' + various clinic distances Assistive device utilized:  standard no wheel walker Level of assistance: SBA and CGA Gait velocity: 1.18 ft/sec Comments: Pt traverses too far forward into walker w/ intermittent misplacement of prosthetic causing toe catch x2.  He advances standard walker too far forward during each stride resulting in large step-to stride pattern.  FUNCTIONAL TESTS:  5 times sit to stand: 16.45 seconds w/ BUE support-transfers UE support to RW, needed restart x1 due to instability initially 6 minute walk test: 405' w/ standard walker 10 meter walk test: 28.07  seconds w/ standard walker = 0.36 m/sec OR 1.18 ft/sec Berg Balance Scale: To be assessed.  CURRENT PROSTHETIC WEAR ASSESSMENT: Patient is independent with: residual limb care, care of non-amputated limb, prosthetic cleaning, and ply sock cleaning Patient is dependent with: skin check, correct ply sock adjustment, proper wear schedule/adjustment, and proper weight-bearing schedule/adjustment Donning prosthesis: Complete Independence Doffing prosthesis: Complete Independence Prosthetic wear tolerance: 8 hours inconsistently/day, 7 days/week  Prosthetic weight bearing tolerance: 1 hour Edema: no visible swelling on eval, has stopped wearing shrinker. Residual limb condition: Sweat/moist, clean, well-shaped, mild invagination of scar that is well healed, less hair than proximal leg, warm. Prosthetic description:  ischial containment suction socket w/ flexible inner liner w/ polycentric 4-bar knee w/ pneumatic swing control and fixed ankle SACH foot K code/activity level with prosthetic use: Level 3   TODAY'S TREATMENT:      TherAct For LTG assessment:  [facility] PT Assessment - 09/05/22 1411       Standardized Balance Assessment   Standardized Balance  Assessment Berg Balance Test      Berg Balance Test   Sit to Stand Able to stand without using hands and stabilize independently    Standing Unsupported Able to stand safely 2 minutes    Sitting with Back Unsupported but Feet Supported on Floor or Stool Able to sit safely and securely 2 minutes    Stand to Sit Sits safely with minimal use of hands    Transfers Able to transfer safely, minor use of hands    Standing Unsupported with Eyes Closed Able to stand 10 seconds with supervision    Standing Unsupported with Feet Together Able to place feet together independently and stand 1 minute safely    From Standing, Reach Forward with Outstretched Arm Reaches forward but needs supervision    From Standing Position, Pick up Object from Floor Unable  to try/needs assist to keep balance    From Standing Position, Turn to Look Behind Over each Shoulder Turn sideways only but maintains balance    Turn 360 Degrees Needs assistance while turning    Standing Unsupported, Alternately Place Feet on Step/Stool Able to complete >2 steps/needs minimal assist    Standing Unsupported, One Foot in Front Needs help to step but can hold 15 seconds    Standing on One Leg Able to lift leg independently and hold equal to or more than 3 seconds    Total Score 34    Berg comment: 34/56            TherEx Resisted sit to stand with blue band 3 x 10 reps  Static standing balance with no UE support performing ball toss against rebounder with 500 g ball: Normal stance x 15 reps Romberg stance 2 x 15 reps L/R modified tandem stance 2 x 15 reps each No LOB but does need CGA overall   PATIENT EDUCATION: Education details:  results of OM and functional implication, PT POC Person educated: Patient Education method: Explanation Education comprehension: verbalized understanding and needs further education   HOME EXERCISE PROGRAM: Access Code: [email or web address] URL: [email or web address] Date: 07/15/2022 Prepared by: Peter Congo  Exercises - Prone Hip Flexor Stretch with Towel Roll (AKA)  - 1 x daily - 7 x weekly - 1 sets - 1 reps - 10 minutes hold - Standing Hip Flexion March  - 1 x daily - 7 x weekly - 3 sets - 10 reps - Standing Hip Abduction  - 1 x daily - 7 x weekly - 3 sets - 10 reps - Standing Hip Extension with Chair  - 1 x daily - 7 x weekly - 3 sets - 10 reps - Sidelying Hip Abduction (AKA)  - 1 x daily - 7 x weekly - 3 sets - 10 reps  AMPUTEE SINK HEP (Provided to pt and daughter in [place] and spanish 5/16) Haz cada ejercicio 1-2 veces al da. Haz cada ejercicio 10 repeticiones. Mantenga cada ejercicio durante 2 segundos para sentir su ubicacin.  EN EL FREGADERO ENCUENTRE SU POSICIN EN LA LNEA MEDIA Y COLOQUE LOS PIES A LA  IGUAL DISTANCIA DE LA LNEA MEDIA. Trate de encontrar esta posicin cuando est quieto para Arts development officer.  UTILICE CINTA EN EL PISO PARA MARCAR LA POSICIN DE LA LNEA MEDIA. Tambin debe intentar sentir con la extremidad la presin en la cavidad. Ests tratando de sentir con las extremidades lo que solas sentir con la planta del pie.  1. Desplazamiento de lado a lado: moviendo solo las caderas (no los hombros): [employer]  peso hacia la pierna izquierda, MANTENER/SENTIR. Vuelva a colocar el mismo peso en cada pierna, MANTENER/SENTIR. Mueva el peso sobre su pierna derecha, MANTENER/SENTIR. Vuelva a colocar el mismo peso en cada pierna, MANTENER/SENTIR. Repetir. Comience con ambas manos en el lavabo, avance solo hacia la derecha y luego sin manos. 2. Cambio de adelante hacia atrs: moviendo solo las caderas (no los hombros): [employer] el peso hacia adelante [employer] dedos de los pies, MANTENER/SENTIR. Mueva su peso hacia atrs para igualar el pie plano en ambas piernas, MANTENER/SENTIR. Mueva su peso nuevamente sobre sus talones, MANTENER/SENTIR. Mueva su peso hacia atrs para igualarlo en ambas piernas, MANTENER/SENTIR. Repetir. comience con ambas manos en el fregadero, avance solo [employer] mano derecha, luego sin [place]. 3. Conos/copas en movimiento: Con el mismo peso en cada pierna: Sujtese con una mano la primera vez, luego avance hasta no apoyar las manos. Mueva las tazas de un lado del fregadero al otro. Coloque los vasos a ~2" fuera de su alcance, avance hasta 10" ms all de su alcance. Coloque una mano en el medio del fregadero y extienda la mano con la otra. Haz ambos brazos. Luego coloque una mano y [place] las tazas con la otra. 4. Alcanzar hacia arriba/hacia Tomasita Crumble: se alterna el alcance hasta los gabinetes superiores o el techo si no hay gabinetes presentes. Mantenga el mismo peso en cada pierna. Comience con Edison Simon apoyada en el mostrador mientras la otra mano se extiende y avance hasta no  apoyar la mano al [place]. Coloque una mano en el medio del fregadero y extindala con la [place]. Haz ambos brazos. Luego coloque una mano y [place] las tazas con la otra. 5. Mirando por encima de los hombros: Con el mismo peso en cada pierna: gire alternativamente para mirar por encima de los hombros con una mano apoyada en el mostrador segn sea necesario. Comience con movimientos de la [place] solo para mirar delante del hombro, luego incluso con el hombro y Programme researcher, broadcasting/film/video mirar detrs de usted. Para mirar hacia un lado, mueva la cabeza/los ojos, luego el hombro del costado que mira [place], cambie ms peso hacia el costado y tire de la cadera Risk manager. Coloca una mano en el medio del fregadero y suelta la otra para que tu hombro pueda retroceder. Cambia de mano para mirar hacia otro lado. Luego coloque una mano y [place] las tazas con la otra. 6. Al pisar con una pierna que no est amputada: retire de su camino los artculos que se encuentran debajo del gabinete. Mueva las caderas/pelvis para que el peso recaiga sobre la prtesis. PASE [place]ENT[place] la otra pierna de modo que la parte delantera del pie quede dentro del gabinete. Luego retroceda al piso.   Walking Program Using RW  (Por favor use su andador.): [employer] caminar durante un cierto perodo de Pharmacist, community                          Camine 10 minutos 1 vez al da.             Aumentar 2 minutos cada 7 das              Trabaje hasta 20 minutos seguidos (1 vez al da).               Ejemplo:                         Da  1-2           4-5 minutos     3 veces al da                         Da 7-8           10-12 minutos 2-3 veces al da                         Da 13-14       20-22 minutos 1-2 veces al da  ASSESSMENT:  CLINICAL IMPRESSION: Emphasis of skilled PT session on initiating assessment of LTG in preparation for d/c from PT services next visit. Also worked on LE strengthening and static standing balance. Patient demonstrates  increased fall risk as noted by score of 34/56 on Berg Balance Scale.  (<36= high risk for falls, close to 100%; 37-45 significant >80%; 46-51 moderate >50%; 52-55 lower >25%), however he has improved from score of 24/56 upon initial assessment. Therapy to assess fit of prosthetic following appointment with [facility] with plan to d/c next visit after assessment of LTGs. Continue POC.    OBJECTIVE IMPAIRMENTS: Abnormal gait, decreased activity tolerance, decreased balance, decreased coordination, decreased endurance, decreased knowledge of use of DME, decreased mobility, difficulty walking, decreased ROM, decreased strength, decreased safety awareness, increased edema, improper body mechanics, and prosthetic dependency .   ACTIVITY LIMITATIONS: carrying, lifting, bending, standing, squatting, stairs, transfers, locomotion level, and caring for others  PARTICIPATION LIMITATIONS: meal prep, cleaning, laundry, driving, shopping, community activity, and yard work  PERSONAL FACTORS: Age, Education, Fitness, Past/current experiences, Transportation, and 3+ comorbidities: HTN, CKD, DM2, and prostate cancer w/ ongoing radiation  are also affecting patient's functional outcome.   REHAB POTENTIAL: Good  CLINICAL DECISION MAKING: Evolving/moderate complexity  EVALUATION COMPLEXITY: Moderate   GOALS: Goals reviewed with patient? Yes  SHORT TERM GOALS: Target date: 08/09/2022  Pt will be independent and compliant with initial strength and balance HEP to improve functional mobility and prosthetic management. Baseline:  To be established. Goal status: MET  2.  Pt will decrease 5xSTS to </=13 seconds w/o UE support in order to demonstrate decreased risk for falls and improved functional bilateral LE strength and power. Baseline: 16.45 sec w/ BUE support, 14.63 sec with one UE support (5/30) Goal status: IN PROGRESS  3.  Pt will ambulate >/=605 feet on to demonstrate improved functional endurance for  home and community participation. Baseline:  405' continuously w/ standard walker, 600 ft with RW (5/30) Goal status: IN PROGRESS  4.  Pt will demonstrate a gait speed of >/=1.48 feet/sec in order to decrease risk for falls. Baseline: 1.18 ft/sec w/ standard walker, 1.98 ft/sec with RW (5/30) Goal status: MET  5.  Pt will improve Berg score to 29/56 for decreased fall risk Baseline:  24/56 (5/6), 30/56 (5/30) Goal status: MET  LONG TERM GOALS: Target date: 09/09/2022 (updated to match last scheduled visit within POC)  Patient to maintain established walking program x3 days per week to improve cardiovascular tolerance and functional mobility w/ prosthesis and LRAD as needed. Baseline:  To be established. Goal status: INITIAL  2.  Pt will ambulate >/=805 feet on to demonstrate improved functional endurance for home and community participation. Baseline: 405' continuously w/ standard walker, 600 ft with RW (5/30) Goal status: INITIAL  3.  Pt will demonstrate a gait speed of >/=2.0 feet/sec in order to decrease risk for falls. Baseline:  1.18 ft/sec w/ standard walker, 1.98 ft/sec with RW (5/30) Goal status: REVISED/UPGRADED  4.  Pt will improve Berg score to 34/56 for decreased fall risk Baseline:  24/56 (5/6), 30/56 (5/30), 34/56 (6/27) Goal status: MET  5.  Pt will ambulate >/=250 feet over level surfaces using quad cane for improved access to home environment. Baseline: To be initiated. Goal status: INITIAL  6.  Pt will navigate 4 stairs SBA using single rail and cane as necessary to improve entry to home environment. Baseline:  To be assessed. Goal status: INITIAL   PLAN:  PT FREQUENCY: 2x/week  PT DURATION: 8 weeks  PLANNED INTERVENTIONS: Therapeutic exercises, Therapeutic activity, Neuromuscular re-education, Balance training, Gait training, Patient/Family education, Self Care, Joint mobilization, Stair training, Vestibular training, Prosthetic training, DME  instructions, Manual therapy, and Re-evaluation  PLAN FOR NEXT SESSION: ASSESS LTGs-D/C  Peter Congo, PT Peter Congo, PT, DPT, CSRS  09/05/2022, 2:42 PM

## 2022-09-06 DIAGNOSIS — M109 Gout, unspecified: Secondary | ICD-10-CM | POA: Diagnosis not present

## 2022-09-06 DIAGNOSIS — I10 Essential (primary) hypertension: Secondary | ICD-10-CM | POA: Diagnosis not present

## 2022-09-06 DIAGNOSIS — I749 Embolism and thrombosis of unspecified artery: Secondary | ICD-10-CM | POA: Diagnosis not present

## 2022-09-09 ENCOUNTER — Ambulatory Visit: Payer: 59 | Attending: Orthopedic Surgery | Admitting: Physical Therapy

## 2022-09-09 DIAGNOSIS — R2681 Unsteadiness on feet: Secondary | ICD-10-CM | POA: Insufficient documentation

## 2022-09-09 DIAGNOSIS — R2689 Other abnormalities of gait and mobility: Secondary | ICD-10-CM | POA: Insufficient documentation

## 2022-09-09 DIAGNOSIS — M6281 Muscle weakness (generalized): Secondary | ICD-10-CM | POA: Insufficient documentation

## 2022-09-09 NOTE — Therapy (Signed)
OUTPATIENT PHYSICAL THERAPY PROSTHETICS TREATMENT-DISCHARGE NOTE   Patient Name: Tim Vincent MRN: [id number] DOB:April 21, 1955, 67 y.o., male Today's Date: 09/09/2022  PCP: Filomena Jungling, NP REFERRING PROVIDER: Nadara Mustard, MD   PHYSICAL THERAPY DISCHARGE SUMMARY  Visits from Start of Care: 17  Current functional level related to goals / functional outcomes: Mod I with RW   Remaining deficits: Impaired balance, increased fall risk   Education / Equipment: Continue use of RW in the community, can continue to practice with [facility] at home; handout for HEP   Patient agrees to discharge. Patient goals were partially met. Patient is being discharged due to being pleased with the current functional level.      END OF SESSION:  PT End of Session - 09/09/22 1406     Visit Number 17    Number of Visits 17   16 + eval   Date for PT Re-Evaluation 09/13/22   pushed out due to frequency and scheduling conflicts w/ radiation therapy   Authorization Type [employer] MEDICARE    Progress Note Due on Visit 10    PT Start Time 1405   pt arrived late   PT Stop Time 1434   d/c   PT Time Calculation (min) 29 min    Equipment Utilized During Treatment Gait belt    Activity Tolerance Patient tolerated treatment well    Behavior During Therapy WFL for tasks assessed/performed                        Past Medical History:  Diagnosis Date   Diabetes mellitus without complication (HCC)    Hypertension    Past Surgical History:  Procedure Laterality Date   AMPUTATION Left 04/17/2022   Procedure: LEFT ABOVE KNEE AMPUTATION;  Surgeon: Nadara Mustard, MD;  Location: [facility];  Service: Orthopedics;  Laterality: Left;   MASS EXCISION Left 04/17/2021   Procedure: EXCISION LEFT ELBOW MASS;  Surgeon: Allena Napoleon, MD;  Location: [facility];  Service: Plastics;  Laterality: Left;  1 hour   MASS EXCISION Left 08/10/2021   Procedure: EXCISION ELBOW MASS;  Surgeon:  Allena Napoleon, MD;  Location: [facility];  Service: Plastics;  Laterality: Left;  1 hour   Patient Active Problem List   Diagnosis Date Noted   Diabetic infection of left foot (HCC) 04/17/2022   Necrotizing fasciitis of lower leg (HCC) 04/17/2022   Dyslipidemia 04/17/2022   Essential hypertension 01/12/2021   Obesity 01/12/2021   Skin sensation disturbance 01/12/2021   Type 2 diabetes mellitus without complication (HCC) 01/12/2021   Diabetic neuropathy (HCC) 01/12/2021   Hav (hallux abducto valgus), unspecified laterality 01/12/2021   Cough 10/31/2017   Elevated blood pressure reading 10/31/2017    ONSET DATE: 04/17/2022 (date of amputation)  REFERRING DIAG: W09.811 (ICD-10-CM) - S/P above knee amputation, left (HCC)  THERAPY DIAG:  Other abnormalities of gait and mobility  Muscle weakness (generalized)  Unsteadiness on feet  Rationale for Evaluation and Treatment: Rehabilitation  SUBJECTIVE:   SUBJECTIVE STATEMENT: Pt went to [facility] last Friday and it went well, they adjusted size of his socket rather than giving him thicker socks. He is only wearing 2 green socks today, feels like his prosthetic leg has been fitting better. No falls, no pain.  Pt accompanied by: self, son, and Spanish-language interpreter  PERTINENT HISTORY: HTN, DM2, CKD, prostate cancer undergoing radiation treatment and androgen deprivation therapy  PAIN:  Are you having pain?  No  PRECAUTIONS: Fall; pt is having radiation therapy M-F in the mornings  WEIGHT BEARING RESTRICTIONS: No  FALLS: Has patient fallen in last 6 months? Yes. Number of falls 2-his did not have prosthetic donned, thought his foot was there and tried to step and went down  LIVING ENVIRONMENT: Lives with: lives with their family and lives with their spouse-2 adult children, 1 son-in-law, 3 infant children Lives in: House/apartment Home Access: Stairs to enter Home layout: One level Stairs: Yes: External: 4  steps; bilateral but cannot reach both Has following equipment at home: Dan Humphreys - 2 wheeled, Wheelchair (manual), and shower chair  PLOF: Independent with household mobility with device, Independent with transfers, Requires assistive device for independence, and Needs assistance with homemaking  PATIENT GOALS: "To walk better and learn to use the prosthetic."  OBJECTIVE:   DIAGNOSTIC FINDINGS: No recent relevant findings.  COGNITION: Overall cognitive status: Within functional limits for tasks assessed   SENSATION: Light touch: WFL  POSTURE: No Significant postural limitations; pt has right mass superior to olecranon (per MD notes related to gout)  LOWER EXTREMITY ROM:  Active ROM Right eval Left eval  Hip flexion WNL WFL  Hip extension    Hip abduction    Hip adduction    Hip internal rotation    Hip external rotation    Knee flexion    Knee extension    Ankle dorsiflexion    Ankle plantarflexion    Ankle inversion    Ankle eversion     (Blank rows = not tested)  LOWER EXTREMITY MMT:  MMT Right eval Left eval  Hip flexion 4+/5 4/5  Hip extension    Hip abduction 4/5 3+/5  Hip adduction 5/5 3/5  Hip internal rotation    Hip external rotation    Knee flexion 5/5   Knee extension 5/5   Ankle dorsiflexion 5/5   Ankle plantarflexion    Ankle inversion    Ankle eversion    (Blank rows = not tested)  BED MOBILITY:  Sit to supine Complete Independence Supine to sit Complete Independence Rolling to Right Complete Independence Rolling to Left Complete Independence  TRANSFERS: Sit to stand: Complete Independence Stand to sit: Complete Independence Floor transfers: daughter states family assisted when pt fell to floor  GAIT: Gait pattern: step to pattern, decreased hip/knee flexion- Left, and trunk flexed Distance walked: 405' + various clinic distances Assistive device utilized:  standard no wheel walker Level of assistance: SBA and CGA Gait velocity:  1.18 ft/sec Comments: Pt traverses too far forward into walker w/ intermittent misplacement of prosthetic causing toe catch x2.  He advances standard walker too far forward during each stride resulting in large step-to stride pattern.  FUNCTIONAL TESTS:  5 times sit to stand: 16.45 seconds w/ BUE support-transfers UE support to RW, needed restart x1 due to instability initially 6 minute walk test: 405' w/ standard walker 10 meter walk test: 28.07 seconds w/ standard walker = 0.36 m/sec OR 1.18 ft/sec Berg Balance Scale: To be assessed.  CURRENT PROSTHETIC WEAR ASSESSMENT: Patient is independent with: residual limb care, care of non-amputated limb, prosthetic cleaning, and ply sock cleaning Patient is dependent with: skin check, correct ply sock adjustment, proper wear schedule/adjustment, and proper weight-bearing schedule/adjustment Donning prosthesis: Complete Independence Doffing prosthesis: Complete Independence Prosthetic wear tolerance: 8 hours inconsistently/day, 7 days/week  Prosthetic weight bearing tolerance: 1 hour Edema: no visible swelling on eval, has stopped wearing shrinker. Residual limb condition: Sweat/moist, clean, well-shaped, mild invagination of  scar that is well healed, less hair than proximal leg, warm. Prosthetic description:  ischial containment suction socket w/ flexible inner liner w/ polycentric 4-bar knee w/ pneumatic swing control and fixed ankle SACH foot K code/activity level with prosthetic use: Level 3   TODAY'S TREATMENT:      TherAct For LTG assessment:  [facility] PT Assessment - 09/09/22 1419       Ambulation/Gait   Gait velocity 32.8 ft over 14.47 sec = 2.27 ft/sec      6 minute walk test results    Aerobic Endurance Distance Walked 636    Endurance additional comments RPE 3/10             PATIENT EDUCATION: Education details:  results of OM and functional implications, continue with HEP and walking program Person educated: Patient and  Child(ren) Education method: Explanation Education comprehension: verbalized understanding   HOME EXERCISE PROGRAM: Access Code: [email or web address] URL: [email or web address] Date: 07/15/2022 Prepared by: Peter Congo  Exercises - Prone Hip Flexor Stretch with Towel Roll (AKA)  - 1 x daily - 7 x weekly - 1 sets - 1 reps - 10 minutes hold - Standing Hip Flexion March  - 1 x daily - 7 x weekly - 3 sets - 10 reps - Standing Hip Abduction  - 1 x daily - 7 x weekly - 3 sets - 10 reps - Standing Hip Extension with Chair  - 1 x daily - 7 x weekly - 3 sets - 10 reps - Sidelying Hip Abduction (AKA)  - 1 x daily - 7 x weekly - 3 sets - 10 reps  AMPUTEE SINK HEP (Provided to pt and daughter in [place] and spanish 5/16) Haz cada ejercicio 1-2 veces al da. Haz cada ejercicio 10 repeticiones. Mantenga cada ejercicio durante 2 segundos para sentir su ubicacin.  EN EL FREGADERO ENCUENTRE SU POSICIN EN LA LNEA MEDIA Y COLOQUE LOS PIES A LA IGUAL DISTANCIA DE LA LNEA MEDIA. Trate de encontrar esta posicin cuando est quieto para Arts development officer.  UTILICE CINTA EN EL PISO PARA MARCAR LA POSICIN DE LA LNEA MEDIA. Tambin debe intentar sentir con la extremidad la presin en la cavidad. Ests tratando de sentir con las extremidades lo que solas sentir con la planta del pie.  1. Desplazamiento de lado a lado: moviendo solo las caderas (no los hombros): [employer] el peso hacia la pierna izquierda, MANTENER/SENTIR. Vuelva a colocar el mismo peso en cada pierna, MANTENER/SENTIR. Mueva el peso sobre su pierna derecha, MANTENER/SENTIR. Vuelva a colocar el mismo peso en cada pierna, MANTENER/SENTIR. Repetir. Comience con ambas manos en el lavabo, avance solo hacia la derecha y luego sin manos. 2. Cambio de adelante hacia atrs: moviendo solo las caderas (no los hombros): [employer] el peso hacia adelante [employer] dedos de los pies, MANTENER/SENTIR. Mueva su peso hacia atrs para igualar el pie plano en  ambas piernas, MANTENER/SENTIR. Mueva su peso nuevamente sobre sus talones, MANTENER/SENTIR. Mueva su peso hacia atrs para igualarlo en ambas piernas, MANTENER/SENTIR. Repetir. comience con ambas manos en el fregadero, avance solo [employer] mano derecha, luego sin [place]. 3. Conos/copas en movimiento: Con el mismo peso en cada pierna: Sujtese con una mano la primera vez, luego avance hasta no apoyar las manos. Mueva las tazas de un lado del fregadero al otro. Coloque los vasos a ~2" fuera de su alcance, avance hasta 10" ms all de su alcance. Coloque una mano en el medio del fregadero y extienda la mano con la otra. Haz ambos  brazos. Luego coloque una mano y [place] las tazas con la otra. 4. Alcanzar hacia arriba/hacia Tomasita Crumble: se alterna el alcance hasta los gabinetes superiores o el techo si no hay gabinetes presentes. Mantenga el mismo peso en cada pierna. Comience con Edison Simon apoyada en el mostrador mientras la otra mano se extiende y avance hasta no apoyar la mano al [place]. Coloque una mano en el medio del fregadero y extindala con la [place]. Haz ambos brazos. Luego coloque una mano y [place] las tazas con la otra. 5. Mirando por encima de los hombros: Con el mismo peso en cada pierna: gire alternativamente para mirar por encima de los hombros con una mano apoyada en el mostrador segn sea necesario. Comience con movimientos de la [place] solo para mirar delante del hombro, luego incluso con el hombro y Programme researcher, broadcasting/film/video mirar detrs de usted. Para mirar hacia un lado, mueva la cabeza/los ojos, luego el hombro del costado que mira [place], cambie ms peso hacia el costado y tire de la cadera Risk manager. Coloca una mano en el medio del fregadero y suelta la otra para que tu hombro pueda retroceder. Cambia de mano para mirar hacia otro lado. Luego coloque una mano y [place] las tazas con la otra. 6. Al pisar con una pierna que no est amputada: retire de su camino los artculos que se encuentran debajo del  gabinete. Mueva las caderas/pelvis para que el peso recaiga sobre la prtesis. PASE LENTAMENTE la otra pierna de modo que la parte delantera del pie quede dentro del gabinete. Luego retroceda al piso.   Walking Program Using RW  (Por favor use su andador.): [employer] caminar durante un cierto perodo de Pharmacist, community                          Camine 10 minutos 1 vez al da.             Aumentar 2 minutos cada 7 das              Trabaje hasta 20 minutos seguidos (1 vez al da).               Ejemplo:                         Da 1-2           4-5 minutos     3 veces al da                         Da 7-8           10-12 minutos 2-3 veces al da                         Da 13-14       20-22 minutos 1-2 veces al da  ASSESSMENT:  CLINICAL IMPRESSION: Emphasis of skilled PT session on reassessing LTG in preparation for d/c from OPPT services this date. Pt has met 3/6 goals due to being independent with his HEP and his walking program, improving his gait speed to 2.27 ft/sec, and improving his BBS core to 34/56. He did improve his endurance on the by ambulating x 636 ft but did not meet his goal of 805 ft. Additionally, his two other goals were related to gait and stairs with a cane and therapy is recommending that pt  continue with his RW at this time for safety. Pt agreeable to d/c this session and continue with his HEP and walking program.    OBJECTIVE IMPAIRMENTS: Abnormal gait, decreased activity tolerance, decreased balance, decreased coordination, decreased endurance, decreased knowledge of use of DME, decreased mobility, difficulty walking, decreased ROM, decreased strength, decreased safety awareness, increased edema, improper body mechanics, and prosthetic dependency .   ACTIVITY LIMITATIONS: carrying, lifting, bending, standing, squatting, stairs, transfers, locomotion level, and caring for others  PARTICIPATION LIMITATIONS: meal prep, cleaning, laundry, driving, shopping, community  activity, and yard work  PERSONAL FACTORS: Age, Education, Fitness, Past/current experiences, Transportation, and 3+ comorbidities: HTN, CKD, DM2, and prostate cancer w/ ongoing radiation  are also affecting patient's functional outcome.   REHAB POTENTIAL: Good  CLINICAL DECISION MAKING: Evolving/moderate complexity  EVALUATION COMPLEXITY: Moderate   GOALS: Goals reviewed with patient? Yes  SHORT TERM GOALS: Target date: 08/09/2022  Pt will be independent and compliant with initial strength and balance HEP to improve functional mobility and prosthetic management. Baseline:  To be established. Goal status: MET  2.  Pt will decrease 5xSTS to </=13 seconds w/o UE support in order to demonstrate decreased risk for falls and improved functional bilateral LE strength and power. Baseline: 16.45 sec w/ BUE support, 14.63 sec with one UE support (5/30) Goal status: IN PROGRESS  3.  Pt will ambulate >/=605 feet on to demonstrate improved functional endurance for home and community participation. Baseline:  405' continuously w/ standard walker, 600 ft with RW (5/30) Goal status: IN PROGRESS  4.  Pt will demonstrate a gait speed of >/=1.48 feet/sec in order to decrease risk for falls. Baseline: 1.18 ft/sec w/ standard walker, 1.98 ft/sec with RW (5/30) Goal status: MET  5.  Pt will improve Berg score to 29/56 for decreased fall risk Baseline:  24/56 (5/6), 30/56 (5/30) Goal status: MET  LONG TERM GOALS: Target date: 09/09/2022 (updated to match last scheduled visit within POC)  Patient to maintain established walking program x3 days per week to improve cardiovascular tolerance and functional mobility w/ prosthesis and LRAD as needed. Baseline:  To be established. Goal status: MET  2.  Pt will ambulate >/=805 feet on to demonstrate improved functional endurance for home and community participation. Baseline: 405' continuously w/ standard walker, 600 ft with RW (5/30), 636 ft with  RW (7/1) Goal status: NOT MET  3.  Pt will demonstrate a gait speed of >/=2.0 feet/sec in order to decrease risk for falls. Baseline: 1.18 ft/sec w/ standard walker, 1.98 ft/sec with RW (5/30), 2.27 ft/sec with RW (7/1) Goal status: MET  4.  Pt will improve Berg score to 34/56 for decreased fall risk Baseline:  24/56 (5/6), 30/56 (5/30), 34/56 (6/27) Goal status: MET  5.  Pt will ambulate >/=250 feet over level surfaces using quad cane for improved access to home environment. Baseline: To be initiated., recommend continued use of RW and continue to practice with [facility] at home, has ambulated about 115 ft [facility] Goal status: NOT MET  6.  Pt will navigate 4 stairs SBA using single rail and cane as necessary to improve entry to home environment. Baseline: pt requires use of 2 handrails to safely navigate stairs Goal status: NOT MET     Peter Congo, PT Peter Congo, PT, DPT, CSRS  09/09/2022, 2:34 PM

## 2022-09-11 DIAGNOSIS — E119 Type 2 diabetes mellitus without complications: Secondary | ICD-10-CM | POA: Diagnosis not present

## 2022-09-11 DIAGNOSIS — N1832 Chronic kidney disease, stage 3b: Secondary | ICD-10-CM | POA: Diagnosis not present

## 2022-09-11 DIAGNOSIS — E1122 Type 2 diabetes mellitus with diabetic chronic kidney disease: Secondary | ICD-10-CM | POA: Diagnosis not present

## 2022-09-11 DIAGNOSIS — N189 Chronic kidney disease, unspecified: Secondary | ICD-10-CM | POA: Diagnosis not present

## 2022-09-11 DIAGNOSIS — I10 Essential (primary) hypertension: Secondary | ICD-10-CM | POA: Diagnosis not present

## 2022-09-11 DIAGNOSIS — R809 Proteinuria, unspecified: Secondary | ICD-10-CM | POA: Diagnosis not present

## 2022-09-11 DIAGNOSIS — Z5112 Encounter for antineoplastic immunotherapy: Secondary | ICD-10-CM | POA: Diagnosis not present

## 2022-09-11 DIAGNOSIS — Z7901 Long term (current) use of anticoagulants: Secondary | ICD-10-CM | POA: Diagnosis not present

## 2022-09-11 DIAGNOSIS — I4891 Unspecified atrial fibrillation: Secondary | ICD-10-CM | POA: Diagnosis not present

## 2022-09-11 DIAGNOSIS — D649 Anemia, unspecified: Secondary | ICD-10-CM | POA: Diagnosis not present

## 2022-09-11 DIAGNOSIS — Z794 Long term (current) use of insulin: Secondary | ICD-10-CM | POA: Diagnosis not present

## 2022-09-11 DIAGNOSIS — Z5181 Encounter for therapeutic drug level monitoring: Secondary | ICD-10-CM | POA: Diagnosis not present

## 2022-09-11 DIAGNOSIS — I129 Hypertensive chronic kidney disease with stage 1 through stage 4 chronic kidney disease, or unspecified chronic kidney disease: Secondary | ICD-10-CM | POA: Diagnosis not present

## 2022-09-16 DIAGNOSIS — L089 Local infection of the skin and subcutaneous tissue, unspecified: Secondary | ICD-10-CM | POA: Diagnosis not present

## 2022-09-16 DIAGNOSIS — M726 Necrotizing fasciitis: Secondary | ICD-10-CM | POA: Diagnosis not present

## 2022-09-16 DIAGNOSIS — E11628 Type 2 diabetes mellitus with other skin complications: Secondary | ICD-10-CM | POA: Diagnosis not present

## 2022-10-01 ENCOUNTER — Telehealth: Payer: Self-pay | Admitting: *Deleted

## 2022-10-01 NOTE — Telephone Encounter (Signed)
Dtr called in to move preop due to scheduling conflict. Moved to [place] at 2p w/ Gerre Pebbles

## 2022-10-01 NOTE — Progress Notes (Deleted)
Patient ID: Tim Vincent, male    DOB: [birth year], 67 y.o.   MRN: [id number]  No chief complaint on file.   No diagnosis found.   History of Present Illness: Tim Vincent is a 67 y.o.  male  with a history of ***.  He presents for preoperative evaluation for upcoming procedure, ***, scheduled for *** with Dr. {[id number]::"QMVHQI","[id number]"}.  The patient {HAS HAS LKG:40102} had problems with anesthesia. ***  Summary of Previous Visit: ***  Job: ***  PMH Significant for: ***   Past Medical History: Allergies: No Known Allergies  Current Medications:  Current Outpatient Medications:    acetaminophen (TYLENOL) 325 MG tablet, Take 1-2 tablets (325-650 mg total) by mouth every 6 (six) hours as needed for mild pain (pain score 1-3 or temp > 100.5)., Disp: , Rfl:    amLODipine (NORVASC) 5 MG tablet, Take 1 tablet (5 mg total) by mouth daily., Disp: 30 tablet, Rfl: 1   apixaban (ELIQUIS) 5 MG TABS tablet, Take 1 tablet (5 mg total) by mouth 2 (two) times daily., Disp: 60 tablet, Rfl: 1   atorvastatin (LIPITOR) 40 MG tablet, Take 40 mg by mouth daily., Disp: , Rfl:    bisacodyl (DULCOLAX) 5 MG EC tablet, Take 1 tablet (5 mg total) by mouth daily as needed for moderate constipation., Disp: 30 tablet, Rfl: 0   colchicine 0.6 MG tablet, Take 0.5 tablets (0.3 mg total) by mouth daily., Disp: 3 tablet, Rfl: 0   metoprolol tartrate (LOPRESSOR) 25 MG tablet, Take 1 tablet (25 mg total) by mouth 2 (two) times daily., Disp: 60 tablet, Rfl: 1   oxyCODONE (OXY IR/ROXICODONE) 5 MG immediate release tablet, Take 1-2 tablets (5-10 mg total) by mouth every 4 (four) hours as needed for moderate pain (pain score 4-6)., Disp: 20 tablet, Rfl: 0   polyethylene glycol (MIRALAX / GLYCOLAX) 17 g packet, Take 17 g by mouth daily as needed for mild constipation., Disp: 14 each, Rfl: 0   predniSONE (DELTASONE) 10 MG tablet, TAKE 1 TABLET (10 MG TOTAL) BY MOUTH DAILY WITH BREAKFAST., Disp: 30 tablet,  Rfl: 0   TRADJENTA 5 MG TABS tablet, Take 5 mg by mouth daily., Disp: , Rfl:   Past Medical Problems: Past Medical History:  Diagnosis Date   Diabetes mellitus without complication (HCC)    Hypertension     Past Surgical History: Past Surgical History:  Procedure Laterality Date   AMPUTATION Left 04/17/2022   Procedure: LEFT ABOVE KNEE AMPUTATION;  Surgeon: Nadara Mustard, MD;  Location: [facility];  Service: Orthopedics;  Laterality: Left;   MASS EXCISION Left 04/17/2021   Procedure: EXCISION LEFT ELBOW MASS;  Surgeon: Allena Napoleon, MD;  Location: [facility];  Service: Plastics;  Laterality: Left;  1 hour   MASS EXCISION Left 08/10/2021   Procedure: EXCISION ELBOW MASS;  Surgeon: Allena Napoleon, MD;  Location: [facility];  Service: Plastics;  Laterality: Left;  1 hour    Social History: Social History   Socioeconomic History   Marital status: Married    Spouse name: Not on file   Number of children: Not on file   Years of education: Not on file   Highest education level: Not on file  Occupational History   Not on file  Tobacco Use   Smoking status: Never   Smokeless tobacco: Never  Vaping Use   Vaping status: Never Used  Substance and Sexual Activity   Alcohol use: Yes  Alcohol/week: 6.0 standard drinks of alcohol    Types: 6 Cans of beer per week   Drug use: Never   Sexual activity: Not on file  Other Topics Concern   Not on file  Social History Narrative   Not on file   Social Determinants of Health   Financial Resource Strain: Low Risk  (03/12/2022)   Received from [facility], [facility]   Overall Financial Resource Strain (CARDIA)    Difficulty of Paying Living Expenses: Not very hard  Food Insecurity: No Food Insecurity (04/17/2022)   Hunger Vital Sign    Worried About Running Out of Food in the Last Year: Never true    Ran Out of Food in the Last Year: Never true  Transportation Needs: No Transportation Needs (04/17/2022)    PRAPARE - Administrator, Civil Service (Medical): No    Lack of Transportation (Non-Medical): No  Physical Activity: Insufficiently Active (02/22/2022)   Received from [facility], [facility]   Exercise Vital Sign    Days of Exercise per Week: 3 days    Minutes of Exercise per Session: 30 min  Stress: No Stress Concern Present (02/22/2022)   Received from [facility], [facility] of Occupational Health - Occupational Stress Questionnaire    Feeling of Stress : Not at all  Social Connections: Socially Integrated (02/22/2022)   Received from [facility], [facility]   Social Network    How would you rate your social network (family, work, friends)?: Good participation with social networks  Intimate Partner Violence: Not At Risk (04/17/2022)   Humiliation, Afraid, Rape, and Kick questionnaire    Fear of Current or Ex-Partner: No    Emotionally Abused: No    Physically Abused: No    Sexually Abused: No    Family History: No family history on file.  Review of Systems: ROS  Physical Exam: Vital Signs There were no vitals taken for this visit.  Physical Exam *** Constitutional:      General: Not in acute distress.    Appearance: Normal appearance. Not ill-appearing.  HENT:     Head: Normocephalic and atraumatic.  Eyes:     Pupils: Pupils are equal, round Neck:     Musculoskeletal: Normal range of motion.  Cardiovascular:     Rate and Rhythm: Normal rate    Pulses: Normal pulses.  Pulmonary:     Effort: Pulmonary effort is normal. No respiratory distress.  Abdominal:     General: Abdomen is flat. There is no distension.  Musculoskeletal: Normal range of motion.  Skin:    General: Skin is warm and dry.     Findings: No erythema or rash.  Neurological:     General: No focal deficit present.     Mental Status: Alert and oriented to person, place, and time. Mental status is at baseline.     Motor: No weakness.  Psychiatric:         Mood and Affect: Mood normal.        Behavior: Behavior normal.    Assessment/Plan: The patient is scheduled for *** with Dr. {ZOXWR:[id number]::"UJWJXB","JYNWGNFAOZ"}.  Risks, benefits, and alternatives of procedure discussed, questions answered and consent obtained.    Smoking Status: ***; Counseling Given? *** Last Mammogram: ***; Results: ***  Caprini Score: ***; Risk Factors include: ***, BMI *** 25, and length of planned surgery. Recommendation for mechanical *** prophylaxis. Encourage early ambulation.   Pictures obtained: @consult ***  Post-op Rx sent to pharmacy: {  Blank:19197::"Oxycodone, Zofran, Keflex","Oxycodone, Zofran"}  Patient was provided with the *** General Surgical Risk consent document and Pain Medication Agreement prior to their appointment.  They had adequate time to read through the risk consent documents and Pain Medication Agreement. We also discussed them in person together during this preop appointment. All of their questions were answered to their satisfaction.  Recommended calling if they have any further questions.  Risk consent form and Pain Medication Agreement to be scanned into patient's chart.  ***   Electronically signed by: Laurena Spies, PA-C 10/01/2022 11:40 AM

## 2022-10-02 ENCOUNTER — Encounter: Payer: 59 | Admitting: Student

## 2022-10-04 ENCOUNTER — Telehealth: Payer: Self-pay | Admitting: *Deleted

## 2022-10-04 NOTE — Telephone Encounter (Signed)
Spoke to Daughter about having to move surgery to [facility] on 10/16/22 at 1500 due to [facility]

## 2022-10-07 ENCOUNTER — Encounter: Payer: 59 | Admitting: Physician Assistant

## 2022-10-09 ENCOUNTER — Ambulatory Visit (INDEPENDENT_AMBULATORY_CARE_PROVIDER_SITE_OTHER): Payer: 59 | Admitting: Surgical

## 2022-10-09 ENCOUNTER — Encounter: Payer: Self-pay | Admitting: Surgical

## 2022-10-09 ENCOUNTER — Telehealth: Payer: Self-pay | Admitting: *Deleted

## 2022-10-09 VITALS — BP 131/66 | HR 75

## 2022-10-09 DIAGNOSIS — M1A9XX1 Chronic gout, unspecified, with tophus (tophi): Secondary | ICD-10-CM

## 2022-10-09 NOTE — Telephone Encounter (Signed)
Request for cardiac clearance and for pt to hold Eliquis 48 hours before surgery and 24 hours after surgery faxed to Dr. Fawn Kirk with fax confirmation received. Fax #[phone]

## 2022-10-09 NOTE — H&P (View-Only) (Signed)
Patient ID: Tim Vincent, male    DOB: Jan 31, 1956, 67 y.o.   MRN: [id number]  Chief Complaint  Patient presents with   Pre-op Exam      ICD-10-CM   1. Gout with tophus  M1A.9XX1        History of Present Illness: Tim Vincent is a 67 y.o.  male  with a history of right arm mass/gouty tophi.  He presents for preoperative evaluation for upcoming procedure, excision of right arm mass, scheduled for 10/16/2022 with Dr. Ladona Ridgel.  The patient has not had problems with anesthesia. No history of DVT/PE.  No family history of DVT/PE.  No family or personal history of bleeding or clotting disorders.   No history of CVA/MI.   Summary of Previous Visit: Patient with history of right elbow gouty tophus, he had similar excision left side and would like excision on the right.  He reports that has been present for about 20 years now.  Of note, he has been started on Eliquis for atrial fibrillation.  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 History of prostate cancer, underwent radiation therapy which was completed on 08/02/2022. History of CKD stage III/stage IV  Patient has been seen by cardiology, last appointment on 05/09/2022.  At that time discussed able to hold Eliquis 48 hours prior to prostate procedure, but no mention of scheduled excision of right gouty tophus.   Patient reports she is feeling well.  No recent changes to his health.  Past Medical History: Allergies: No Known Allergies  Current Medications:  Current Outpatient Medications:    acetaminophen (TYLENOL) 325 MG tablet, Take 1-2 tablets (325-650 mg total) by mouth every 6 (six) hours as needed for mild pain (pain score 1-3 or temp > 100.5)., Disp: , Rfl:    amLODipine (NORVASC) 5 MG tablet, Take 1  tablet (5 mg total) by mouth daily., Disp: 30 tablet, Rfl: 1   apixaban (ELIQUIS) 5 MG TABS tablet, Take 1 tablet (5 mg total) by mouth 2 (two) times daily., Disp: 60 tablet, Rfl: 1   atorvastatin (LIPITOR) 40 MG tablet, Take 40 mg by mouth daily., Disp: , Rfl:    bisacodyl (DULCOLAX) 5 MG EC tablet, Take 1 tablet (5 mg total) by mouth daily as needed for moderate constipation., Disp: 30 tablet, Rfl: 0   colchicine 0.6 MG tablet, Take 0.5 tablets (0.3 mg total) by mouth daily., Disp: 3 tablet, Rfl: 0   metoprolol tartrate (LOPRESSOR) 25 MG tablet, Take 1 tablet (25 mg total) by mouth 2 (two) times daily., Disp: 60 tablet, Rfl: 1   oxyCODONE (OXY IR/ROXICODONE) 5 MG immediate release tablet, Take 1-2 tablets (5-10 mg total) by mouth every 4 (four) hours as needed for moderate pain (pain score 4-6)., Disp: 20 tablet, Rfl: 0   polyethylene glycol (MIRALAX / GLYCOLAX) 17 g packet, Take 17 g by mouth daily as needed for mild constipation., Disp: 14 each, Rfl: 0   predniSONE (DELTASONE) 10 MG tablet, TAKE 1 TABLET (10 MG TOTAL) BY MOUTH DAILY WITH BREAKFAST., Disp: 30 tablet, Rfl: 0   TRADJENTA 5 MG TABS tablet, Take 5 mg by mouth daily., Disp: , Rfl:   Past Medical Problems: Past Medical History:  Diagnosis Date   Diabetes mellitus without complication (HCC)    Hypertension  Past Surgical History: Past Surgical History:  Procedure Laterality Date   AMPUTATION Left 04/17/2022   Procedure: LEFT ABOVE KNEE AMPUTATION;  Surgeon: Nadara Mustard, MD;  Location: [facility] OR;  Service: Orthopedics;  Laterality: Left;   MASS EXCISION Left 04/17/2021   Procedure: EXCISION LEFT ELBOW MASS;  Surgeon: Allena Napoleon, MD;  Location: [facility];  Service: Plastics;  Laterality: Left;  1 hour   MASS EXCISION Left 08/10/2021   Procedure: EXCISION ELBOW MASS;  Surgeon: Allena Napoleon, MD;  Location: [facility];  Service: Plastics;  Laterality: Left;  1 hour    Social History: Social  History   Socioeconomic History   Marital status: Married    Spouse name: Not on file   Number of children: Not on file   Years of education: Not on file   Highest education level: Not on file  Occupational History   Not on file  Tobacco Use   Smoking status: Never   Smokeless tobacco: Never  Vaping Use   Vaping status: Never Used  Substance and Sexual Activity   Alcohol use: Yes    Alcohol/week: 6.0 standard drinks of alcohol    Types: 6 Cans of beer per week   Drug use: Never   Sexual activity: Not on file  Other Topics Concern   Not on file  Social History Narrative   Not on file   Social Determinants of Health   Financial Resource Strain: Low Risk  (03/12/2022)   Received from [facility], [facility]   Overall Financial Resource Strain (CARDIA)    Difficulty of Paying Living Expenses: Not very hard  Food Insecurity: No Food Insecurity (04/17/2022)   Hunger Vital Sign    Worried About Running Out of Food in the Last Year: Never true    Ran Out of Food in the Last Year: Never true  Transportation Needs: No Transportation Needs (04/17/2022)   PRAPARE - Administrator, Civil Service (Medical): No    Lack of Transportation (Non-Medical): No  Physical Activity: Insufficiently Active (02/22/2022)   Received from [facility], [facility]   Exercise Vital Sign    Days of Exercise per Week: 3 days    Minutes of Exercise per Session: 30 min  Stress: No Stress Concern Present (02/22/2022)   Received from [facility], [facility] - Occupational Stress Questionnaire    Feeling of Stress : Not at all  Social Connections: Socially Integrated (02/22/2022)   Received from [facility], [facility]   Social Network    How would you rate your social network (family, work, friends)?: Good participation with social networks  Intimate Partner Violence: Not At Risk (04/17/2022)   Humiliation, Afraid, Rape, and Kick  questionnaire    Fear of Current or Ex-Partner: No    Emotionally Abused: No    Physically Abused: No    Sexually Abused: No    Family History: No family history on file.  Review of Systems: Review of Systems  Constitutional: Negative.   Respiratory: Negative.    Cardiovascular:  Negative for chest pain.  Gastrointestinal: Negative.   Neurological: Negative.     Physical Exam: Vital Signs BP 131/66 (BP Location: Left Arm, Patient Position: Sitting, Cuff Size: Small)   Pulse 75   SpO2 97%   Physical Exam Constitutional:      General: Not in acute distress.    Appearance: Normal appearance. Not ill-appearing.  HENT:  Head: Normocephalic and atraumatic.  Eyes:     Pupils: Pupils are equal, round Neck:     Musculoskeletal: Normal range of motion.  Cardiovascular:     Rate and Rhythm: Normal rate    Pulses: Normal pulses.  Pulmonary:     Effort: Pulmonary effort is normal. No respiratory distress.  Abdominal:     General: Abdomen is flat. There is no distension.  Musculoskeletal: Normal range of motion.  Skin:    General: Skin is warm and dry.     Findings: No erythema or rash.  Neurological:     General: No focal deficit present.     Mental Status: Alert and oriented to person, place, and time. Mental status is at baseline.     Motor: No weakness.  Psychiatric:        Mood and Affect: Mood normal.        Behavior: Behavior normal.    Assessment/Plan: The patient is scheduled for excision of right arm mass with Dr. Ladona Ridgel.  Risks, benefits, and alternatives of procedure discussed, questions answered and consent obtained.    Smoking Status: quit smoking years ago; Counseling Given? N/a  Caprini Score: 8, high; Risk Factors include: Age, history of prostate cancer, wheelchair-bound, BMI > 25, and length of planned surgery. Recommendation for mechanical and possible pharmacological prophylaxis (will restart Eliquis postoperatively). Encourage early ambulation.    Pictures obtained: @consult   Post-op Rx sent to pharmacy: Oxycodone, Zofran  Patient was provided with the General Surgical Risk consent document and Pain Medication Agreement prior to their appointment.  They had adequate time to read through the risk consent documents and Pain Medication Agreement. We also discussed them in person together during this preop appointment. All of their questions were answered to their satisfaction.  Recommended calling if they have any further questions.  Risk consent form and Pain Medication Agreement to be scanned into patient's chart.  We discussed increased risk of postoperative complications related to his diabetes, use of prednisone.  Will send surgical clearance to patient's cardiologist at [facility] cardiology in [place].  Patient last seen by Dr. Alda Berthold, MD. will need to hold Eliquis prior to surgery.  Patient is on prednisone and he is aware that this may delay his healing   Electronically signed by: Kermit Balo , PA-C 10/09/2022 2:43 PM

## 2022-10-09 NOTE — Progress Notes (Signed)
Patient ID: Tim Vincent, male    DOB: Jan 31, 1956, 67 y.o.   MRN: [id number]  Chief Complaint  Patient presents with   Pre-op Exam      ICD-10-CM   1. Gout with tophus  M1A.9XX1        History of Present Illness: Tim Vincent is a 67 y.o.  male  with a history of right arm mass/gouty tophi.  He presents for preoperative evaluation for upcoming procedure, excision of right arm mass, scheduled for 10/16/2022 with Dr. Ladona Ridgel.  The patient has not had problems with anesthesia. No history of DVT/PE.  No family history of DVT/PE.  No family or personal history of bleeding or clotting disorders.   No history of CVA/MI.   Summary of Previous Visit: Patient with history of right elbow gouty tophus, he had similar excision left side and would like excision on the right.  He reports that has been present for about 20 years now.  Of note, he has been started on Eliquis for atrial fibrillation.  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 History of prostate cancer, underwent radiation therapy which was completed on 08/02/2022. History of CKD stage III/stage IV  Patient has been seen by cardiology, last appointment on 05/09/2022.  At that time discussed able to hold Eliquis 48 hours prior to prostate procedure, but no mention of scheduled excision of right gouty tophus.   Patient reports she is feeling well.  No recent changes to his health.  Past Medical History: Allergies: No Known Allergies  Current Medications:  Current Outpatient Medications:    acetaminophen (TYLENOL) 325 MG tablet, Take 1-2 tablets (325-650 mg total) by mouth every 6 (six) hours as needed for mild pain (pain score 1-3 or temp > 100.5)., Disp: , Rfl:    amLODipine (NORVASC) 5 MG tablet, Take 1  tablet (5 mg total) by mouth daily., Disp: 30 tablet, Rfl: 1   apixaban (ELIQUIS) 5 MG TABS tablet, Take 1 tablet (5 mg total) by mouth 2 (two) times daily., Disp: 60 tablet, Rfl: 1   atorvastatin (LIPITOR) 40 MG tablet, Take 40 mg by mouth daily., Disp: , Rfl:    bisacodyl (DULCOLAX) 5 MG EC tablet, Take 1 tablet (5 mg total) by mouth daily as needed for moderate constipation., Disp: 30 tablet, Rfl: 0   colchicine 0.6 MG tablet, Take 0.5 tablets (0.3 mg total) by mouth daily., Disp: 3 tablet, Rfl: 0   metoprolol tartrate (LOPRESSOR) 25 MG tablet, Take 1 tablet (25 mg total) by mouth 2 (two) times daily., Disp: 60 tablet, Rfl: 1   oxyCODONE (OXY IR/ROXICODONE) 5 MG immediate release tablet, Take 1-2 tablets (5-10 mg total) by mouth every 4 (four) hours as needed for moderate pain (pain score 4-6)., Disp: 20 tablet, Rfl: 0   polyethylene glycol (MIRALAX / GLYCOLAX) 17 g packet, Take 17 g by mouth daily as needed for mild constipation., Disp: 14 each, Rfl: 0   predniSONE (DELTASONE) 10 MG tablet, TAKE 1 TABLET (10 MG TOTAL) BY MOUTH DAILY WITH BREAKFAST., Disp: 30 tablet, Rfl: 0   TRADJENTA 5 MG TABS tablet, Take 5 mg by mouth daily., Disp: , Rfl:   Past Medical Problems: Past Medical History:  Diagnosis Date   Diabetes mellitus without complication (HCC)    Hypertension  Past Surgical History: Past Surgical History:  Procedure Laterality Date   AMPUTATION Left 04/17/2022   Procedure: LEFT ABOVE KNEE AMPUTATION;  Surgeon: Nadara Mustard, MD;  Location: [facility] OR;  Service: Orthopedics;  Laterality: Left;   MASS EXCISION Left 04/17/2021   Procedure: EXCISION LEFT ELBOW MASS;  Surgeon: Allena Napoleon, MD;  Location: [facility];  Service: Plastics;  Laterality: Left;  1 hour   MASS EXCISION Left 08/10/2021   Procedure: EXCISION ELBOW MASS;  Surgeon: Allena Napoleon, MD;  Location: [facility];  Service: Plastics;  Laterality: Left;  1 hour    Social History: Social  History   Socioeconomic History   Marital status: Married    Spouse name: Not on file   Number of children: Not on file   Years of education: Not on file   Highest education level: Not on file  Occupational History   Not on file  Tobacco Use   Smoking status: Never   Smokeless tobacco: Never  Vaping Use   Vaping status: Never Used  Substance and Sexual Activity   Alcohol use: Yes    Alcohol/week: 6.0 standard drinks of alcohol    Types: 6 Cans of beer per week   Drug use: Never   Sexual activity: Not on file  Other Topics Concern   Not on file  Social History Narrative   Not on file   Social Determinants of Health   Financial Resource Strain: Low Risk  (03/12/2022)   Received from [facility], [facility]   Overall Financial Resource Strain (CARDIA)    Difficulty of Paying Living Expenses: Not very hard  Food Insecurity: No Food Insecurity (04/17/2022)   Hunger Vital Sign    Worried About Running Out of Food in the Last Year: Never true    Ran Out of Food in the Last Year: Never true  Transportation Needs: No Transportation Needs (04/17/2022)   PRAPARE - Administrator, Civil Service (Medical): No    Lack of Transportation (Non-Medical): No  Physical Activity: Insufficiently Active (02/22/2022)   Received from [facility], [facility]   Exercise Vital Sign    Days of Exercise per Week: 3 days    Minutes of Exercise per Session: 30 min  Stress: No Stress Concern Present (02/22/2022)   Received from [facility], [facility] - Occupational Stress Questionnaire    Feeling of Stress : Not at all  Social Connections: Socially Integrated (02/22/2022)   Received from [facility], [facility]   Social Network    How would you rate your social network (family, work, friends)?: Good participation with social networks  Intimate Partner Violence: Not At Risk (04/17/2022)   Humiliation, Afraid, Rape, and Kick  questionnaire    Fear of Current or Ex-Partner: No    Emotionally Abused: No    Physically Abused: No    Sexually Abused: No    Family History: No family history on file.  Review of Systems: Review of Systems  Constitutional: Negative.   Respiratory: Negative.    Cardiovascular:  Negative for chest pain.  Gastrointestinal: Negative.   Neurological: Negative.     Physical Exam: Vital Signs BP 131/66 (BP Location: Left Arm, Patient Position: Sitting, Cuff Size: Small)   Pulse 75   SpO2 97%   Physical Exam Constitutional:      General: Not in acute distress.    Appearance: Normal appearance. Not ill-appearing.  HENT:  Head: Normocephalic and atraumatic.  Eyes:     Pupils: Pupils are equal, round Neck:     Musculoskeletal: Normal range of motion.  Cardiovascular:     Rate and Rhythm: Normal rate    Pulses: Normal pulses.  Pulmonary:     Effort: Pulmonary effort is normal. No respiratory distress.  Abdominal:     General: Abdomen is flat. There is no distension.  Musculoskeletal: Normal range of motion.  Skin:    General: Skin is warm and dry.     Findings: No erythema or rash.  Neurological:     General: No focal deficit present.     Mental Status: Alert and oriented to person, place, and time. Mental status is at baseline.     Motor: No weakness.  Psychiatric:        Mood and Affect: Mood normal.        Behavior: Behavior normal.    Assessment/Plan: The patient is scheduled for excision of right arm mass with Dr. Ladona Ridgel.  Risks, benefits, and alternatives of procedure discussed, questions answered and consent obtained.    Smoking Status: quit smoking years ago; Counseling Given? N/a  Caprini Score: 8, high; Risk Factors include: Age, history of prostate cancer, wheelchair-bound, BMI > 25, and length of planned surgery. Recommendation for mechanical and possible pharmacological prophylaxis (will restart Eliquis postoperatively). Encourage early ambulation.    Pictures obtained: @consult   Post-op Rx sent to pharmacy: Oxycodone, Zofran  Patient was provided with the General Surgical Risk consent document and Pain Medication Agreement prior to their appointment.  They had adequate time to read through the risk consent documents and Pain Medication Agreement. We also discussed them in person together during this preop appointment. All of their questions were answered to their satisfaction.  Recommended calling if they have any further questions.  Risk consent form and Pain Medication Agreement to be scanned into patient's chart.  We discussed increased risk of postoperative complications related to his diabetes, use of prednisone.  Will send surgical clearance to patient's cardiologist at [facility] cardiology in [place].  Patient last seen by Dr. Alda Berthold, MD. will need to hold Eliquis prior to surgery.  Patient is on prednisone and he is aware that this may delay his healing   Electronically signed by: Kermit Balo , PA-C 10/09/2022 2:43 PM

## 2022-10-09 NOTE — Telephone Encounter (Signed)
Correct fax # is [phone]

## 2022-10-10 ENCOUNTER — Encounter: Payer: 59 | Admitting: Plastic Surgery

## 2022-10-10 ENCOUNTER — Encounter: Payer: 59 | Admitting: Student

## 2022-10-14 ENCOUNTER — Encounter (HOSPITAL_COMMUNITY): Payer: Self-pay | Admitting: Plastic Surgery

## 2022-10-15 ENCOUNTER — Telehealth: Payer: Self-pay | Admitting: *Deleted

## 2022-10-15 ENCOUNTER — Encounter (HOSPITAL_COMMUNITY): Payer: Self-pay | Admitting: Plastic Surgery

## 2022-10-15 ENCOUNTER — Other Ambulatory Visit: Payer: Self-pay

## 2022-10-15 NOTE — Telephone Encounter (Signed)
Called office, LVM checking on status of surgical clearance request

## 2022-10-15 NOTE — Progress Notes (Signed)
Patient is Spanish speaking.  Used [employer] ID #[id number] for information and instructions for DOS.  PCP - Filomena Jungling, NP Cardiologist - Dr Fawn Kirk Hematology/Oncology - Dr Joycie Peek Nephrology - Sherin Quarry, DO  Chest x-ray - n/a EKG - 04/17/22 Stress Test - n/a ECHO - 04/18/22 Cardiac Cath - n/a  ICD Pacemaker/Loop - n/a  Sleep Study -  n/a CPAP - none  Diabetes Type 2 Do not take Tradjenta on the morning of surgery.  If your blood sugar is less than 70 mg/dL, you will need to treat for low blood sugar: Treat a low blood sugar (less than 70 mg/dL) with  cup of clear juice (cranberry or apple), 4 glucose tablets, OR glucose gel. Recheck blood sugar in 15 minutes after treatment (to make sure it is greater than 70 mg/dL). If your blood sugar is not greater than 70 mg/dL on recheck, call [phone] for further instructions.  Blood Thinner Instructions:  Follow your surgeon's instructions on when to stop Eliquis prior to surgery.  Last dose was on 10/13/22.  ERAS: Clear liquids til 12 Noon DOS.  STOP now taking any Aspirin (unless otherwise instructed by your surgeon), Aleve, Naproxen, Ibuprofen, Motrin, Advil, Goody's, BC's, all herbal medications, fish oil, and all vitamins.   Coronavirus Screening Do you have any of the following symptoms:  Cough yes/no: No Fever (>100.39F)  yes/no: No Runny nose yes/no: No Sore throat yes/no: No Difficulty breathing/shortness of breath  yes/no: No  Have you traveled in the last 14 days and where? yes/no: No  Patient verbalized understanding of instructions that were given via phone via Spanish Interpreter ID #[id number].

## 2022-10-15 NOTE — Telephone Encounter (Signed)
Call received from [facility] with Dr. Arletta Bale office. She reports they faxed the surgical clearance to our office yesterday. She is re-faxing it today

## 2022-10-15 NOTE — Telephone Encounter (Signed)
-----   Message from Nurse  B sent at 10/09/2022  4:16 PM EDT ----- I sent it over today ----- Message ----- From: Leslee Home, PA-C Sent: 10/09/2022  12:52 PM EDT To: Zannie Kehr, RN  Hey , can we send a cardiac clearance to this patient's cardiologist to hold Eliquis prior to his procedure with Dr. Ladona Ridgel?  Dr. Alda Berthold, MD [facility] health cardiology, [facility], MD (Attending) NPI: [id number] [phone] (Work) [phone] (Fax) [address] [place], [place] [address] Cardiovascular Disease

## 2022-10-16 ENCOUNTER — Ambulatory Visit (HOSPITAL_COMMUNITY): Payer: 59 | Admitting: Anesthesiology

## 2022-10-16 ENCOUNTER — Ambulatory Visit (HOSPITAL_COMMUNITY)
Admission: RE | Admit: 2022-10-16 | Discharge: 2022-10-16 | Disposition: A | Discharge status: Home / Self Care | Payer: 59 | Attending: Plastic Surgery | Admitting: Plastic Surgery

## 2022-10-16 ENCOUNTER — Encounter (HOSPITAL_COMMUNITY)
Admission: RE | Disposition: A | Discharge status: Home / Self Care | Payer: Self-pay | Source: Home / Self Care | Attending: Plastic Surgery

## 2022-10-16 ENCOUNTER — Ambulatory Visit (HOSPITAL_BASED_OUTPATIENT_CLINIC_OR_DEPARTMENT_OTHER): Payer: 59 | Admitting: Anesthesiology

## 2022-10-16 ENCOUNTER — Other Ambulatory Visit: Payer: Self-pay

## 2022-10-16 ENCOUNTER — Encounter (HOSPITAL_COMMUNITY): Payer: Self-pay | Admitting: Plastic Surgery

## 2022-10-16 DIAGNOSIS — Z7901 Long term (current) use of anticoagulants: Secondary | ICD-10-CM | POA: Insufficient documentation

## 2022-10-16 DIAGNOSIS — M109 Gout, unspecified: Secondary | ICD-10-CM

## 2022-10-16 DIAGNOSIS — M10021 Idiopathic gout, right elbow: Secondary | ICD-10-CM | POA: Diagnosis not present

## 2022-10-16 DIAGNOSIS — Z87891 Personal history of nicotine dependence: Secondary | ICD-10-CM | POA: Insufficient documentation

## 2022-10-16 DIAGNOSIS — M1A9XX1 Chronic gout, unspecified, with tophus (tophi): Secondary | ICD-10-CM

## 2022-10-16 DIAGNOSIS — M1A0211 Idiopathic chronic gout, right elbow, with tophus (tophi): Secondary | ICD-10-CM | POA: Insufficient documentation

## 2022-10-16 DIAGNOSIS — I4891 Unspecified atrial fibrillation: Secondary | ICD-10-CM | POA: Diagnosis not present

## 2022-10-16 DIAGNOSIS — Z89612 Acquired absence of left leg above knee: Secondary | ICD-10-CM | POA: Insufficient documentation

## 2022-10-16 DIAGNOSIS — M7989 Other specified soft tissue disorders: Secondary | ICD-10-CM | POA: Diagnosis not present

## 2022-10-16 HISTORY — DX: Malignant (primary) neoplasm, unspecified: C80.1

## 2022-10-16 HISTORY — DX: Constipation, unspecified: K59.00

## 2022-10-16 HISTORY — PX: MASS EXCISION: SHX2000

## 2022-10-16 HISTORY — DX: Gout, unspecified: M10.9

## 2022-10-16 HISTORY — DX: Hyperlipidemia, unspecified: E78.5

## 2022-10-16 LAB — CBC
HCT: 28.8 % — ABNORMAL LOW (ref 39.0–52.0)
Hemoglobin: 9.7 g/dL — ABNORMAL LOW (ref 13.0–17.0)
MCH: 30.4 pg (ref 26.0–34.0)
MCHC: 33.7 g/dL (ref 30.0–36.0)
MCV: 90.3 fL (ref 80.0–100.0)
Platelets: 174 10*3/uL (ref 150–400)
RBC: 3.19 MIL/uL — ABNORMAL LOW (ref 4.22–5.81)
RDW: 13.7 % (ref 11.5–15.5)
WBC: 7.5 10*3/uL (ref 4.0–10.5)
nRBC: 0 % (ref 0.0–0.2)

## 2022-10-16 LAB — BASIC METABOLIC PANEL
Anion gap: 16 — ABNORMAL HIGH (ref 5–15)
BUN: 49 mg/dL — ABNORMAL HIGH (ref 8–23)
CO2: 18 mmol/L — ABNORMAL LOW (ref 22–32)
Calcium: 8.7 mg/dL — ABNORMAL LOW (ref 8.9–10.3)
Chloride: 103 mmol/L (ref 98–111)
Creatinine, Ser: 2.69 mg/dL — ABNORMAL HIGH (ref 0.61–1.24)
GFR, Estimated: 25 mL/min — ABNORMAL LOW (ref >60)
Glucose, Bld: 221 mg/dL — ABNORMAL HIGH (ref 70–99)
Potassium: 4.1 mmol/L (ref 3.5–5.1)
Sodium: 137 mmol/L (ref 135–145)

## 2022-10-16 LAB — GLUCOSE, CAPILLARY
Glucose-Capillary: 206 mg/dL — ABNORMAL HIGH (ref 70–99)
Glucose-Capillary: 181 mg/dL — ABNORMAL HIGH (ref 70–99)
Glucose-Capillary: 185 mg/dL — ABNORMAL HIGH (ref 70–99)

## 2022-10-16 SURGERY — EXCISION MASS
Anesthesia: General | Site: Elbow | Laterality: Right

## 2022-10-16 MED ORDER — BUPIVACAINE-EPINEPHRINE (PF) 0.25% -1:200000 IJ SOLN
INTRAMUSCULAR | Status: AC
  Filled 2022-10-16: qty 30

## 2022-10-16 MED ORDER — ACETAMINOPHEN 500 MG PO TABS
1000.0000 mg | ORAL_TABLET | Freq: Once | ORAL | Status: AC
  Administered 2022-10-16: 1000 mg via ORAL
  Filled 2022-10-16: qty 2

## 2022-10-16 MED ORDER — 0.9 % SODIUM CHLORIDE (POUR BTL) OPTIME
TOPICAL | Status: DC | PRN
  Administered 2022-10-16: 1000 mL

## 2022-10-16 MED ORDER — INSULIN ASPART 100 UNIT/ML IJ SOLN
INTRAMUSCULAR | Status: AC
  Administered 2022-10-16: 4 [IU] via SUBCUTANEOUS
  Filled 2022-10-16: qty 1

## 2022-10-16 MED ORDER — LIDOCAINE 2% (20 MG/ML) 5 ML SYRINGE
INTRAMUSCULAR | Status: DC | PRN
  Administered 2022-10-16: 60 mg via INTRAVENOUS

## 2022-10-16 MED ORDER — MIDAZOLAM HCL 2 MG/2ML IJ SOLN
INTRAMUSCULAR | Status: AC
  Filled 2022-10-16: qty 2

## 2022-10-16 MED ORDER — GLYCOPYRROLATE PF 0.2 MG/ML IJ SOSY
PREFILLED_SYRINGE | INTRAMUSCULAR | Status: AC
  Filled 2022-10-16: qty 1

## 2022-10-16 MED ORDER — CHLORHEXIDINE GLUCONATE CLOTH 2 % EX PADS: 6.0000 | MEDICATED_PAD | Freq: Once | CUTANEOUS | Status: DC

## 2022-10-16 MED ORDER — INSULIN ASPART 100 UNIT/ML IJ SOLN: 0.0000 [IU] | INTRAMUSCULAR | Status: DC | PRN

## 2022-10-16 MED ORDER — PROPOFOL 10 MG/ML IV BOLUS
INTRAVENOUS | Status: AC
  Filled 2022-10-16: qty 20

## 2022-10-16 MED ORDER — MIDAZOLAM HCL 2 MG/2ML IJ SOLN
INTRAMUSCULAR | Status: DC | PRN
  Administered 2022-10-16: 2 mg via INTRAVENOUS

## 2022-10-16 MED ORDER — FENTANYL CITRATE (PF) 250 MCG/5ML IJ SOLN
INTRAMUSCULAR | Status: DC | PRN
  Administered 2022-10-16 (×3): 25 ug via INTRAVENOUS

## 2022-10-16 MED ORDER — PHENYLEPHRINE HCL-NACL 20-0.9 MG/250ML-% IV SOLN
INTRAVENOUS | Status: DC | PRN
  Administered 2022-10-16: 25 ug/min via INTRAVENOUS

## 2022-10-16 MED ORDER — PROPOFOL 10 MG/ML IV BOLUS
INTRAVENOUS | Status: DC | PRN
Start: 2022-10-16 — End: 2022-10-16
  Administered 2022-10-16: 170 mg via INTRAVENOUS

## 2022-10-16 MED ORDER — FENTANYL CITRATE (PF) 250 MCG/5ML IJ SOLN
INTRAMUSCULAR | Status: AC
  Filled 2022-10-16: qty 5

## 2022-10-16 MED ORDER — DEXAMETHASONE SODIUM PHOSPHATE 10 MG/ML IJ SOLN
INTRAMUSCULAR | Status: DC | PRN
  Administered 2022-10-16: 5 mg via INTRAVENOUS

## 2022-10-16 MED ORDER — ONDANSETRON HCL 4 MG/2ML IJ SOLN
INTRAMUSCULAR | Status: DC | PRN
  Administered 2022-10-16: 4 mg via INTRAVENOUS

## 2022-10-16 MED ORDER — LACTATED RINGERS IV SOLN: INTRAVENOUS | Status: DC

## 2022-10-16 MED ORDER — BUPIVACAINE-EPINEPHRINE 0.25% -1:200000 IJ SOLN
INTRAMUSCULAR | Status: DC | PRN
Start: 2022-10-16 — End: 2022-10-16
  Administered 2022-10-16: 15 mL

## 2022-10-16 MED ORDER — EPHEDRINE SULFATE-NACL 50-0.9 MG/10ML-% IV SOSY
PREFILLED_SYRINGE | INTRAVENOUS | Status: DC | PRN
  Administered 2022-10-16 (×4): 5 mg via INTRAVENOUS

## 2022-10-16 MED ORDER — CEFAZOLIN SODIUM-DEXTROSE 2-4 GM/100ML-% IV SOLN
2.0000 g | INTRAVENOUS | Status: AC
  Administered 2022-10-16: 2 g via INTRAVENOUS
  Filled 2022-10-16: qty 100

## 2022-10-16 MED ORDER — ONDANSETRON HCL 4 MG/2ML IJ SOLN: 4.0000 mg | Freq: Once | INTRAMUSCULAR | Status: DC | PRN

## 2022-10-16 MED ORDER — FENTANYL CITRATE (PF) 100 MCG/2ML IJ SOLN: 25.0000 ug | INTRAMUSCULAR | Status: DC | PRN

## 2022-10-16 MED ORDER — AMISULPRIDE (ANTIEMETIC) 5 MG/2ML IV SOLN: 10.0000 mg | Freq: Once | INTRAVENOUS | Status: DC | PRN

## 2022-10-16 MED ORDER — PHENYLEPHRINE 80 MCG/ML (10ML) SYRINGE FOR IV PUSH (FOR BLOOD PRESSURE SUPPORT): PREFILLED_SYRINGE | INTRAVENOUS | Status: DC | PRN

## 2022-10-16 MED ORDER — GLYCOPYRROLATE PF 0.2 MG/ML IJ SOSY
PREFILLED_SYRINGE | INTRAMUSCULAR | Status: DC | PRN
  Administered 2022-10-16: .1 mg via INTRAVENOUS

## 2022-10-16 MED ORDER — LIDOCAINE 2% (20 MG/ML) 5 ML SYRINGE
INTRAMUSCULAR | Status: AC
  Filled 2022-10-16: qty 5

## 2022-10-16 MED ORDER — CHLORHEXIDINE GLUCONATE 0.12 % MT SOLN
OROMUCOSAL | Status: AC
  Administered 2022-10-16: 15 mL via OROMUCOSAL
  Filled 2022-10-16: qty 15

## 2022-10-16 MED ORDER — CHLORHEXIDINE GLUCONATE 0.12 % MT SOLN: 15.0000 mL | OROMUCOSAL | Status: AC

## 2022-10-16 MED ORDER — ONDANSETRON HCL 4 MG/2ML IJ SOLN
INTRAMUSCULAR | Status: AC
  Filled 2022-10-16: qty 2

## 2022-10-16 SURGICAL SUPPLY — 54 items
ADH SKN CLS APL DERMABOND .7 (GAUZE/BANDAGES/DRESSINGS) ×1
BAG COUNTER SPONGE SURGICOUNT (BAG) ×1 IMPLANT
BAG SPNG CNTER NS LX DISP (BAG) ×1
BLADE CLIPPER SURG (BLADE) IMPLANT
BLADE SURG 15 STRL LF DISP TIS (BLADE) ×1 IMPLANT
BLADE SURG 15 STRL SS (BLADE) ×1
BNDG CMPR 5X4 CHSV STRCH STRL (GAUZE/BANDAGES/DRESSINGS) ×1
BNDG CMPR 5X4 KNIT ELC UNQ LF (GAUZE/BANDAGES/DRESSINGS) ×1
BNDG COHESIVE 4X5 TAN STRL LF (GAUZE/BANDAGES/DRESSINGS) IMPLANT
BNDG ELASTIC 4INX 5YD STR LF (GAUZE/BANDAGES/DRESSINGS) IMPLANT
BNDG GAUZE DERMACEA FLUFF 4 (GAUZE/BANDAGES/DRESSINGS) IMPLANT
BNDG GZE DERMACEA 4 6PLY (GAUZE/BANDAGES/DRESSINGS) ×2
CANISTER SUCT 3000ML PPV (MISCELLANEOUS) IMPLANT
DERMABOND ADVANCED .7 DNX12 (GAUZE/BANDAGES/DRESSINGS) IMPLANT
DRAPE LAPAROTOMY 100X72 PEDS (DRAPES) IMPLANT
DRAPE U-SHAPE 76X120 STRL (DRAPES) IMPLANT
DRSG MEPILEX POST OP 4X12 (GAUZE/BANDAGES/DRESSINGS) IMPLANT
DRSG TEGADERM 2-3/8X2-3/4 SM (GAUZE/BANDAGES/DRESSINGS) IMPLANT
ELECT CAUTERY BLADE 6.4 (BLADE) IMPLANT
ELECT COATED BLADE 2.86 ST (ELECTRODE) IMPLANT
ELECT NDL BLADE 2-5/6 (NEEDLE) IMPLANT
ELECT NEEDLE BLADE 2-5/6 (NEEDLE)
ELECT REM PT RETURN 9FT ADLT (ELECTROSURGICAL) ×1
ELECTRODE REM PT RTRN 9FT ADLT (ELECTROSURGICAL) ×1 IMPLANT
GAUZE PAD ABD 8X10 STRL (GAUZE/BANDAGES/DRESSINGS) IMPLANT
GAUZE SPONGE 4X4 12PLY STRL LF (GAUZE/BANDAGES/DRESSINGS) ×1 IMPLANT
GLOVE BIO SURGEON STRL SZ8 (GLOVE) ×1 IMPLANT
GLOVE BIOGEL PI IND STRL 8 (GLOVE) ×1 IMPLANT
GOWN STRL REUS W/ TWL LRG LVL3 (GOWN DISPOSABLE) ×3 IMPLANT
GOWN STRL REUS W/TWL LRG LVL3 (GOWN DISPOSABLE) ×3
KIT BASIN OR (CUSTOM PROCEDURE TRAY) ×1 IMPLANT
NDL HYPO 25GX1X1/2 BEV (NEEDLE) ×1 IMPLANT
NEEDLE HYPO 25GX1X1/2 BEV (NEEDLE) ×1
NS IRRIG 1000ML POUR BTL (IV SOLUTION) ×1 IMPLANT
PACK BASIC III (CUSTOM PROCEDURE TRAY) ×1
PACK SRG BSC III STRL LF ECLPS (CUSTOM PROCEDURE TRAY) ×1 IMPLANT
PAD CAST 4YDX4 CTTN HI CHSV (CAST SUPPLIES) IMPLANT
PADDING CAST COTTON 4X4 STRL (CAST SUPPLIES) ×2
PENCIL BUTTON HOLSTER BLD 10FT (ELECTRODE) ×1 IMPLANT
SPIKE FLUID TRANSFER (MISCELLANEOUS) ×1 IMPLANT
SPONGE T-LAP 18X18 ~~LOC~~+RFID (SPONGE) ×2 IMPLANT
STAPLER VISISTAT (STAPLE) IMPLANT
STOCKINETTE 6 STRL (DRAPES) IMPLANT
STRIP CLOSURE SKIN 1/2X4 (GAUZE/BANDAGES/DRESSINGS) IMPLANT
SUT MNCRL AB 3-0 PS2 27 (SUTURE) IMPLANT
SUT MNCRL AB 4-0 PS2 18 (SUTURE) IMPLANT
SUT MON AB 3-0 SH 27 (SUTURE)
SUT MON AB 3-0 SH27 (SUTURE) IMPLANT
SUT SILK 2 0 SH (SUTURE) ×1 IMPLANT
SYR BULB EAR ULCER 3OZ GRN STR (SYRINGE) ×1 IMPLANT
SYR CONTROL 10ML LL (SYRINGE) ×1 IMPLANT
TOWEL GREEN STERILE FF (TOWEL DISPOSABLE) ×1 IMPLANT
TUBE CONNECTING 12X1/4 (SUCTIONS) ×1 IMPLANT
YANKAUER SUCT BULB TIP NO VENT (SUCTIONS) ×1 IMPLANT

## 2022-10-16 NOTE — Op Note (Signed)
DATE OF OPERATION: 10/16/2022  LOCATION: [facility] Main operating Room  PREOPERATIVE DIAGNOSIS: Gouty tophus right elbow  POSTOPERATIVE DIAGNOSIS: Same  PROCEDURE: Excision of gouty tophus  SURGEON: Loren Racer, MD  ASSISTANT: Evelena Leyden  EBL: 10 cc  CONDITION: Stable  COMPLICATIONS: None  INDICATION: The patient, Tim Vincent, is a 67 y.o. male born on [birth year], is here for treatment mass over the right elbow most likely a gouty tophus.Marland Kitchen   PROCEDURE DETAILS:  The patient was seen prior to surgery and marked.   IV antibiotics were given. The patient was taken to the operating room and given a general anesthetic. A standard time out was performed and all information was confirmed by those in the room. SCDs were placed.   The right arm was prepped and draped in usual sterile manner an elliptical incision was made from just proximal to the elbow to approximately 10 cm past the elbow.  The entire length of the incision was approximately 15 cm.  Careful dissection was used to isolate the tophus from the surrounding soft tissues and to dissected away from the bone and elbow.  The joint capsule was completely intact.  The surgical wound was irrigated hemostasis was achieved with electrocautery.  The closure was tight with the elbow flexed so I elected to undermine the soft tissues around the elbow for approximately 5 cm in each direction allowing for a tension-free closure.  The dermis was approximated with interrupted 3-0 Monocryl sutures and the skin was closed with a running 4-0 Monocryl subcuticular stitch.  The incision was sealed with Dermabond and a Mepilex border was placed.  The arm was then wrapped with a soft bulky dressing and a lightly compressive Ace wrap.  The patient was awakened from anesthesia without incident transferred to the recovery room in stable condition all instrument needle and sponge counts were reported as correct.  There were no complications. The patient was allowed to  wake up and taken to recovery room in stable condition at the end of the case. The family was notified at the end of the case.   The advanced practice practitioner (APP) assisted throughout the case.  The APP was essential in retraction and counter traction when needed to make the case progress smoothly.  This retraction and assistance made it possible to see the tissue plans for the procedure.  The assistance was needed for blood control, tissue re-approximation and assisted with closure of the incision site.

## 2022-10-16 NOTE — Transfer of Care (Signed)
Immediate Anesthesia Transfer of Care Note  Patient: Tim Vincent  Procedure(s) Performed: EXCISION OF MASS RIGHT ELBOW AND ARM (Right: Elbow)  Patient Location: PACU  Anesthesia Type:General  Level of Consciousness: awake and alert   Airway & Oxygen Therapy: Patient connected to face mask oxygen  Post-op Assessment: Report given to RN and Post -op Vital signs reviewed and stable  Post vital signs: Reviewed and stable  Last Vitals:  Vitals Value Taken Time  BP 172/83    Temp    Pulse 56 10/16/22 1745  Resp    SpO2 96 % 10/16/22 1745  Vitals shown include unfiled device data.  Last Pain:  Vitals:   10/16/22 1259  PainSc: 0-No pain      Patients Stated Pain Goal: 0 (10/16/22 1259)  Complications: No notable events documented.

## 2022-10-16 NOTE — Discharge Instructions (Signed)
Please do not restart your Eliquis until Friday morning.  Leave the dressings in place.  We will see you in clinic and at that time removed the Ace wrap and underlying dressings.  Mild incisional drainage and mild bleeding is expected, but if you have any significant pain, swelling, or if the dressings are saturated, please call the clinic to come in sooner.  Sponge bathe only, no showering and do not get the area wet.  No reinicie su Eliquis hasta el viernes por la maana.  Deje los Marketing executive.  Lo veremos en la clnica y en ese momento le quitaremos la envoltura Ace y los vendajes subyacentes.  Se espera un drenaje leve de la incisin y un sangrado leve, pero si tiene algn dolor significativo, hinchazn o si los vendajes estn saturados, llame a la clnica para venir antes.  Bese nicamente con esponja, no se duche y no moje el rea.

## 2022-10-16 NOTE — Anesthesia Procedure Notes (Signed)
Procedure Name: LMA Insertion Date/Time: 10/16/2022 3:48 PM  Performed by: Gus Puma, CRNAPre-anesthesia Checklist: Patient identified, Emergency Drugs available, Suction available and Patient being monitored Patient Re-evaluated:Patient Re-evaluated prior to induction Oxygen Delivery Method: Circle System Utilized Preoxygenation: Pre-oxygenation with 100% oxygen Induction Type: IV induction Ventilation: Mask ventilation without difficulty LMA: LMA inserted LMA Size: 4.0 Number of attempts: 1 Airway Equipment and Method: Bite block Placement Confirmation: positive ETCO2 Tube secured with: Tape Dental Injury: Teeth and Oropharynx as per pre-operative assessment

## 2022-10-16 NOTE — Anesthesia Preprocedure Evaluation (Signed)
Anesthesia Evaluation  Patient identified by MRN, date of birth, ID band Patient awake    Reviewed: Allergy & Precautions, NPO status , Patient's Chart, lab work & pertinent test results, reviewed documented beta blocker date and time   Airway Mallampati: II  TM Distance: <3 FB Neck ROM: Full    Dental  (+) Dental Advisory Given, Caps,    Pulmonary neg pulmonary ROS   Pulmonary exam normal breath sounds clear to auscultation       Cardiovascular hypertension, Pt. on medications and Pt. on home beta blockers Normal cardiovascular exam Rhythm:Regular Rate:Normal     Neuro/Psych negative neurological ROS  negative psych ROS   GI/Hepatic negative GI ROS, Neg liver ROS,,,  Endo/Other  diabetes, Type 2    Renal/GU negative Renal ROS   Prostate cancer     Musculoskeletal gout with touphos   Abdominal   Peds  Hematology  (+) Blood dyscrasia (Eliquis), anemia   Anesthesia Other Findings Day of surgery medications reviewed with the patient.  Reproductive/Obstetrics                             Anesthesia Physical Anesthesia Plan  ASA: 2  Anesthesia Plan: General   Post-op Pain Management: Tylenol PO (pre-op)* and Toradol IV (intra-op)*   Induction: Intravenous  PONV Risk Score and Plan: 2 and Dexamethasone and Ondansetron  Airway Management Planned: LMA  Additional Equipment:   Intra-op Plan:   Post-operative Plan: Extubation in OR  Informed Consent: I have reviewed the patients History and Physical, chart, labs and discussed the procedure including the risks, benefits and alternatives for the proposed anesthesia with the patient or authorized representative who has indicated his/her understanding and acceptance.     Dental advisory given and Interpreter used for interveiw  Plan Discussed with: CRNA  Anesthesia Plan Comments:        Anesthesia Quick Evaluation

## 2022-10-16 NOTE — Interval H&P Note (Signed)
History and Physical Interval Note: Pt met in the pre op area, no change in exam or indication for surgery. All questions answered, surgical site marked with his concurrence. Will proceed with excision of gouty tophus, right arm, at his request  10/16/2022 2:56 PM  Ollis Irvin  has presented today for surgery, with the diagnosis of gout with touphos.  The various methods of treatment have been discussed with the patient and family. After consideration of risks, benefits and other options for treatment, the patient has consented to  Procedure(s) with comments: EXCISION MASS (Right) - 1.5 hours as a surgical intervention.  The patient's history has been reviewed, patient examined, no change in status, stable for surgery.  I have reviewed the patient's chart and labs.  Questions were answered to the patient's satisfaction.     Santiago Glad

## 2022-10-16 NOTE — Telephone Encounter (Signed)
I spoke with Steward Drone from Dr. Arletta Bale office to let her know we still have not received the Surgical clearance by fax. She will email it. She read the clearance to me on the phone, states "pt is cleared for surgery". Also states Dr. Fransisco Beau signed the request for pt to hold Eliquis 48 hours before and 24 hours after surgery. Dr. Ladona Ridgel updated

## 2022-10-17 ENCOUNTER — Encounter (HOSPITAL_COMMUNITY): Payer: Self-pay | Admitting: Plastic Surgery

## 2022-10-17 DIAGNOSIS — M726 Necrotizing fasciitis: Secondary | ICD-10-CM | POA: Diagnosis not present

## 2022-10-17 DIAGNOSIS — E11628 Type 2 diabetes mellitus with other skin complications: Secondary | ICD-10-CM | POA: Diagnosis not present

## 2022-10-17 DIAGNOSIS — L089 Local infection of the skin and subcutaneous tissue, unspecified: Secondary | ICD-10-CM | POA: Diagnosis not present

## 2022-10-17 NOTE — Anesthesia Postprocedure Evaluation (Signed)
Anesthesia Post Note  Patient: Tim Vincent  Procedure(s) Performed: EXCISION OF MASS RIGHT ELBOW AND ARM (Right: Elbow)     Patient location during evaluation: PACU Anesthesia Type: General Level of consciousness: awake and alert Pain management: pain level controlled Vital Signs Assessment: post-procedure vital signs reviewed and stable Respiratory status: spontaneous breathing, nonlabored ventilation, respiratory function stable and patient connected to nasal cannula oxygen Cardiovascular status: blood pressure returned to baseline and stable Postop Assessment: no apparent nausea or vomiting Anesthetic complications: no   No notable events documented.  Last Vitals:  Vitals:   10/16/22 1818 10/16/22 1830  BP: (!) 168/71 (!) 172/70  Pulse: (!) 56 (!) 54  Resp: 19 14  Temp:  36.5 C  SpO2: 97% 100%    Last Pain:  Vitals:   10/16/22 1830  PainSc: 0-No pain                 Collene Schlichter

## 2022-10-21 ENCOUNTER — Encounter: Payer: 59 | Admitting: Plastic Surgery

## 2022-10-22 ENCOUNTER — Ambulatory Visit (INDEPENDENT_AMBULATORY_CARE_PROVIDER_SITE_OTHER): Payer: 59 | Admitting: Surgical

## 2022-10-22 DIAGNOSIS — Z9889 Other specified postprocedural states: Secondary | ICD-10-CM

## 2022-10-22 DIAGNOSIS — M1A9XX1 Chronic gout, unspecified, with tophus (tophi): Secondary | ICD-10-CM

## 2022-10-22 NOTE — Progress Notes (Signed)
Patient is a very pleasant 67 year old male here for follow-up after excision of gouty tophus with Dr. Ladona Ridgel on 10/16/2022.  He presents with family who assists him with translation.  Offered Spanish interpreter, however patient did declined due to family being present.  Patient reports that he is doing well, he reports he is not having any fevers or any pain after surgery.  He feels great and has no issues at this time.  He does have some questions about showering and washing the area.  He reports that he has normal function of his entire right upper extremity, normal flexion and extension of his wrist and elbow joints.  He has normal sensation in the distal extremity as well.  On exam he does have some ecchymosis present in the peri-incisional area as well as some expected postoperative swelling.  There is some additional swelling along the proximal posterior forearm which may be a small hematoma, however there is minimal tension on the skin and no significant ecchymosis noted.  Palpable radial pulses noted.  Normal cascade of all 5 digits, normal range of motion of right upper extremity.  A/P:  Mepilex border dressing was removed, photo was taken and placed in the patient's chart with patient's permission.  We discussed continued with restrictions of no heavy lifting or strenuous activities.  We discussed continuing to keep the right upper extremity wrapped for 1 wee to help with swelling and provide compression.  All of his questions and his family members questions were answered to their content.  Recommend following up in 10 to 14 days.  There is no signs of infection or concern on exam.

## 2022-10-23 ENCOUNTER — Encounter: Payer: 59 | Admitting: Surgical

## 2022-10-23 ENCOUNTER — Encounter: Payer: 59 | Admitting: Plastic Surgery

## 2022-11-01 ENCOUNTER — Telehealth: Payer: Self-pay | Admitting: Orthopedic Surgery

## 2022-11-01 NOTE — Telephone Encounter (Signed)
Patient calling in to get an prescription for the prosthetic leg. CB#[phone]

## 2022-11-04 NOTE — Telephone Encounter (Signed)
Filled out and signed. Will fax to [facility].

## 2022-11-05 ENCOUNTER — Encounter: Payer: 59 | Admitting: Surgical

## 2022-11-06 ENCOUNTER — Encounter: Payer: 59 | Admitting: Plastic Surgery

## 2022-11-06 ENCOUNTER — Ambulatory Visit (INDEPENDENT_AMBULATORY_CARE_PROVIDER_SITE_OTHER): Payer: 59 | Admitting: Surgical

## 2022-11-06 DIAGNOSIS — R140 Abdominal distension (gaseous): Secondary | ICD-10-CM | POA: Diagnosis not present

## 2022-11-06 DIAGNOSIS — M1A9XX1 Chronic gout, unspecified, with tophus (tophi): Secondary | ICD-10-CM

## 2022-11-06 DIAGNOSIS — K219 Gastro-esophageal reflux disease without esophagitis: Secondary | ICD-10-CM | POA: Diagnosis not present

## 2022-11-06 DIAGNOSIS — R197 Diarrhea, unspecified: Secondary | ICD-10-CM | POA: Diagnosis not present

## 2022-11-06 MED ORDER — SULFAMETHOXAZOLE-TRIMETHOPRIM 800-160 MG PO TABS: 1.0000 | ORAL_TABLET | Freq: Two times a day (BID) | ORAL | 0 refills | Status: AC

## 2022-11-06 NOTE — Progress Notes (Signed)
Patient is a very pleasant 67 year old male here for follow-up after excision of gouty tophus mass with Dr. Ladona Ridgel on 10/16/2022.  He is 3 weeks postop.   He is here with family, he feels comfortable using her as a Nurse, learning disability as she speaks fluent [place].  He reports he is doing really well, he did report that he recently had a cold but is feeling better.  He reports that he may have bumped the area while he was sick and noticed some increased redness after this.  He is not having any infectious symptoms, he denies any increased pain and he feels as if he has normal range of motion of all right upper extremity joints.  On exam right elbow/arm incision is intact and healing very well.  There is some surrounding redness at the elbow, however it does not appear cellulitic.  There is some potential subcutaneous fluid collection noted palpation.  There is no fluctuance noted.  There is no crepitus noted.  There is no tenderness noted palpation.  Palpable radial pulses noted.  Normal range of motion noted of right elbow and right wrist.  A/P:  Given the patient's diabetes and redness is over the joint, will prophylactically treat with Bactrim.  Discussed this with the patient.  We also discussed that if the redness worsens to notify our clinic and we can see him for follow-up to reevaluate.  He does have an appointment for next week, but he does not feel as if this appointment will be necessary as he is healing really well.  He reports that he will call if the redness worsens and we can see him next week or anytime after that.  Pictures were obtained of the patient and placed in the chart with the patient's or guardian's permission.

## 2022-11-07 DIAGNOSIS — N184 Chronic kidney disease, stage 4 (severe): Secondary | ICD-10-CM | POA: Diagnosis not present

## 2022-11-07 DIAGNOSIS — E1122 Type 2 diabetes mellitus with diabetic chronic kidney disease: Secondary | ICD-10-CM | POA: Diagnosis not present

## 2022-11-07 DIAGNOSIS — I480 Paroxysmal atrial fibrillation: Secondary | ICD-10-CM | POA: Diagnosis not present

## 2022-11-07 DIAGNOSIS — R0600 Dyspnea, unspecified: Secondary | ICD-10-CM | POA: Diagnosis not present

## 2022-11-07 DIAGNOSIS — I10 Essential (primary) hypertension: Secondary | ICD-10-CM | POA: Diagnosis not present

## 2022-11-07 DIAGNOSIS — E785 Hyperlipidemia, unspecified: Secondary | ICD-10-CM | POA: Diagnosis not present

## 2022-11-07 DIAGNOSIS — Z794 Long term (current) use of insulin: Secondary | ICD-10-CM | POA: Diagnosis not present

## 2022-11-13 ENCOUNTER — Encounter: Payer: Self-pay | Admitting: Family

## 2022-11-13 ENCOUNTER — Encounter: Payer: 59 | Admitting: Plastic Surgery

## 2022-11-13 ENCOUNTER — Ambulatory Visit (INDEPENDENT_AMBULATORY_CARE_PROVIDER_SITE_OTHER): Payer: 59 | Admitting: Family

## 2022-11-13 DIAGNOSIS — Z89612 Acquired absence of left leg above knee: Secondary | ICD-10-CM

## 2022-11-13 NOTE — Progress Notes (Signed)
Office Visit Note   Patient: Tim Vincent           Date of Birth: Nov 06, 1955           MRN: [id number] Visit Date: 11/13/2022              Requested by: Filomena Jungling, NP [address] [place],  [place] [address] PCP: Filomena Jungling, NP  Chief Complaint  Patient presents with   Left Leg - Follow-up      HPI: The patient is a 67 year old gentleman seen status post left above-knee amputation.  He currently does have a prosthesis.  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  He has been standing and attempting to get out of a car when his prosthesis fell off.  Patient is an existing left transfemoral amputee.  Patient's current comorbidities are not expected to impact the ability to function with the prescribed prosthesis. Patient verbally communicates a strong desire to use a prosthesis. Patient currently requires mobility aids to ambulate without a prosthesis.  Expects not to use mobility aids with a new prosthesis.  Patient is a K3 level ambulator that spends a lot of time walking around on uneven terrain over obstacles, up and down stairs, and ambulates with a variable cadence.  The patient will benefit from an MPK knee because they frequently encounter uneven terrain, stairs, and sometimes have to walk backwards. The "stumble recovery" and "intuitive stance" features will reduce fall risk and allow the patient to walk down stairs "step over step."   Assessment & Plan: Visit Diagnoses: No diagnosis found.  Plan: Given an order for prosthesis set up for new above-knee amputation prosthesis, K3.  Follow-up in the office as needed.  Follow-Up Instructions: Return if symptoms worsen or fail to improve.   Ortho Exam  Patient is alert, oriented, no adenopathy, well-dressed, normal affect, normal respiratory effort. On examination left above-knee amputation  residual limb is well consolidated well-healed no erythema warmth or sign of infection no impending skin breakdown  Imaging: No results found. No images are attached to the encounter.  Labs: Lab Results  Component Value Date   HGBA1C 6.3 (H) 04/17/2022   LABURIC 7.8 04/21/2022   REPTSTATUS 04/21/2022 FINAL 04/17/2022   GRAMSTAIN  04/17/2022    NO WBC SEEN FEW GRAM NEGATIVE RODS FEW GRAM POSITIVE COCCI    CULT  04/17/2022    FEW ENTEROCOCCUS FAECALIS FEW STREPTOCOCCUS MITIS/ORALIS MODERATE PREVOTELLA BIVIA BETA LACTAMASE POSITIVE Performed at [facility], [address], [place], [place] [address]    [facility] ENTEROCOCCUS FAECALIS 04/17/2022   LABORGA STREPTOCOCCUS MITIS/ORALIS 04/17/2022     Lab Results  Component Value Date   ALBUMIN 2.5 (L) 04/16/2022    Lab Results  Component Value Date   MG 2.1 04/18/2022   No results found for: "VD25OH"  No results found for: "PREALBUMIN"    Latest Ref Rng & Units 10/16/2022   12:39 PM 04/21/2022    2:19 AM 04/19/2022    2:17 AM  CBC EXTENDED  WBC 4.0 - 10.5 K/uL 7.5  10.6  12.4   RBC 4.22 - 5.81 MIL/uL 3.19  3.87  3.50   Hemoglobin 13.0 - 17.0 g/dL 9.7  24.4  01.0   HCT 27.2 - 52.0 % 28.8  35.8  32.4   Platelets 150 - 400 K/uL 174  304  299  There is no height or weight on file to calculate BMI.  Orders:  No orders of the defined types were placed in this encounter.  No orders of the defined types were placed in this encounter.    Procedures: No procedures performed  Clinical Data: No additional findings.  ROS:  All other systems negative, except as noted in the HPI. Review of Systems  Objective: Vital Signs: There were no vitals taken for this visit.  Specialty Comments:  No specialty comments available.  PMFS History: Patient Active Problem List   Diagnosis Date Noted   Diabetic infection of left foot (HCC) 04/17/2022   Necrotizing fasciitis of lower leg (HCC) 04/17/2022   Dyslipidemia  04/17/2022   Essential hypertension 01/12/2021   Obesity 01/12/2021   Skin sensation disturbance 01/12/2021   Type 2 diabetes mellitus without complication (HCC) 01/12/2021   Diabetic neuropathy (HCC) 01/12/2021   Hav (hallux abducto valgus), unspecified laterality 01/12/2021   Cough 10/31/2017   Elevated blood pressure reading 10/31/2017   Past Medical History:  Diagnosis Date   Cancer [facility])    prostate ca   Constipation    Diabetes mellitus without complication (HCC)    type 2   Gout    HLD (hyperlipidemia)    Hypertension     History reviewed. No pertinent family history.  Past Surgical History:  Procedure Laterality Date   AMPUTATION Left 04/17/2022   Procedure: LEFT ABOVE KNEE AMPUTATION;  Surgeon: Nadara Mustard, MD;  Location: [facility] OR;  Service: Orthopedics;  Laterality: Left;   MASS EXCISION Left 04/17/2021   Procedure: EXCISION LEFT ELBOW MASS;  Surgeon: Allena Napoleon, MD;  Location: [facility];  Service: Plastics;  Laterality: Left;  1 hour   MASS EXCISION Left 08/10/2021   Procedure: EXCISION ELBOW MASS;  Surgeon: Allena Napoleon, MD;  Location:  SURGERY CENTER;  Service: Plastics;  Laterality: Left;  1 hour   MASS EXCISION Right 10/16/2022   Procedure: EXCISION OF MASS RIGHT ELBOW AND ARM;  Surgeon: Santiago Glad, MD;  Location: [facility] OR;  Service: Plastics;  Laterality: Right;  1.5 hours   Social History   Occupational History   Not on file  Tobacco Use   Smoking status: Never   Smokeless tobacco: Never  Vaping Use   Vaping status: Never Used  Substance and Sexual Activity   Alcohol use: Not Currently    Alcohol/week: 6.0 standard drinks of alcohol    Types: 6 Cans of beer per week    Comment: Stopped ETOH in 05/2022   Drug use: Never   Sexual activity: Not Currently

## 2022-11-14 ENCOUNTER — Encounter: Payer: 59 | Admitting: Physician Assistant

## 2022-11-17 DIAGNOSIS — M726 Necrotizing fasciitis: Secondary | ICD-10-CM | POA: Diagnosis not present

## 2022-11-17 DIAGNOSIS — E11628 Type 2 diabetes mellitus with other skin complications: Secondary | ICD-10-CM | POA: Diagnosis not present

## 2022-11-17 DIAGNOSIS — L089 Local infection of the skin and subcutaneous tissue, unspecified: Secondary | ICD-10-CM | POA: Diagnosis not present

## 2022-11-21 DIAGNOSIS — R197 Diarrhea, unspecified: Secondary | ICD-10-CM | POA: Diagnosis not present

## 2022-11-21 DIAGNOSIS — K219 Gastro-esophageal reflux disease without esophagitis: Secondary | ICD-10-CM | POA: Diagnosis not present

## 2022-11-21 DIAGNOSIS — K625 Hemorrhage of anus and rectum: Secondary | ICD-10-CM | POA: Diagnosis not present

## 2022-11-26 DIAGNOSIS — I4891 Unspecified atrial fibrillation: Secondary | ICD-10-CM | POA: Diagnosis not present

## 2022-11-26 DIAGNOSIS — R197 Diarrhea, unspecified: Secondary | ICD-10-CM | POA: Diagnosis not present

## 2022-11-26 DIAGNOSIS — D123 Benign neoplasm of transverse colon: Secondary | ICD-10-CM | POA: Diagnosis not present

## 2022-11-26 DIAGNOSIS — K219 Gastro-esophageal reflux disease without esophagitis: Secondary | ICD-10-CM | POA: Diagnosis not present

## 2022-11-26 DIAGNOSIS — K5730 Diverticulosis of large intestine without perforation or abscess without bleeding: Secondary | ICD-10-CM | POA: Diagnosis not present

## 2022-11-26 DIAGNOSIS — K3189 Other diseases of stomach and duodenum: Secondary | ICD-10-CM | POA: Diagnosis not present

## 2022-11-29 DIAGNOSIS — E782 Mixed hyperlipidemia: Secondary | ICD-10-CM | POA: Diagnosis not present

## 2022-11-29 DIAGNOSIS — E119 Type 2 diabetes mellitus without complications: Secondary | ICD-10-CM | POA: Diagnosis not present

## 2022-12-12 DIAGNOSIS — Z5181 Encounter for therapeutic drug level monitoring: Secondary | ICD-10-CM | POA: Diagnosis not present

## 2022-12-12 DIAGNOSIS — Z794 Long term (current) use of insulin: Secondary | ICD-10-CM | POA: Diagnosis not present

## 2022-12-12 DIAGNOSIS — S78119A Complete traumatic amputation at level between unspecified hip and knee, initial encounter: Secondary | ICD-10-CM | POA: Diagnosis not present

## 2022-12-12 DIAGNOSIS — I10 Essential (primary) hypertension: Secondary | ICD-10-CM | POA: Diagnosis not present

## 2022-12-12 DIAGNOSIS — E119 Type 2 diabetes mellitus without complications: Secondary | ICD-10-CM | POA: Diagnosis not present

## 2022-12-12 DIAGNOSIS — N184 Chronic kidney disease, stage 4 (severe): Secondary | ICD-10-CM | POA: Diagnosis not present

## 2022-12-12 DIAGNOSIS — D649 Anemia, unspecified: Secondary | ICD-10-CM | POA: Diagnosis not present

## 2022-12-12 DIAGNOSIS — R809 Proteinuria, unspecified: Secondary | ICD-10-CM | POA: Diagnosis not present

## 2022-12-17 DIAGNOSIS — M726 Necrotizing fasciitis: Secondary | ICD-10-CM | POA: Diagnosis not present

## 2022-12-17 DIAGNOSIS — E11628 Type 2 diabetes mellitus with other skin complications: Secondary | ICD-10-CM | POA: Diagnosis not present

## 2022-12-17 DIAGNOSIS — L089 Local infection of the skin and subcutaneous tissue, unspecified: Secondary | ICD-10-CM | POA: Diagnosis not present

## 2023-01-17 DIAGNOSIS — E11628 Type 2 diabetes mellitus with other skin complications: Secondary | ICD-10-CM | POA: Diagnosis not present

## 2023-01-17 DIAGNOSIS — L089 Local infection of the skin and subcutaneous tissue, unspecified: Secondary | ICD-10-CM | POA: Diagnosis not present

## 2023-01-17 DIAGNOSIS — M726 Necrotizing fasciitis: Secondary | ICD-10-CM | POA: Diagnosis not present

## 2023-01-20 ENCOUNTER — Other Ambulatory Visit: Payer: Self-pay

## 2023-01-20 DIAGNOSIS — Z89612 Acquired absence of left leg above knee: Secondary | ICD-10-CM

## 2023-02-04 ENCOUNTER — Encounter: Payer: Self-pay | Admitting: Physical Therapy

## 2023-02-04 ENCOUNTER — Ambulatory Visit: Payer: 59 | Attending: Orthopedic Surgery | Admitting: Physical Therapy

## 2023-02-04 ENCOUNTER — Other Ambulatory Visit: Payer: Self-pay

## 2023-02-04 ENCOUNTER — Encounter: Payer: 59 | Admitting: Physical Therapy

## 2023-02-04 DIAGNOSIS — R2689 Other abnormalities of gait and mobility: Secondary | ICD-10-CM | POA: Insufficient documentation

## 2023-02-04 DIAGNOSIS — R2681 Unsteadiness on feet: Secondary | ICD-10-CM | POA: Diagnosis not present

## 2023-02-04 DIAGNOSIS — Z89612 Acquired absence of left leg above knee: Secondary | ICD-10-CM | POA: Insufficient documentation

## 2023-02-04 NOTE — Therapy (Signed)
OUTPATIENT PHYSICAL THERAPY PROSTHETIC EVALUATION   Patient Name: Tim Vincent MRN: [id number] DOB:[birth year], 67 y.o., male Today's Date: 02/04/2023  PCP: Filomena Jungling, NP  REFERRING PROVIDER: Aldean Baker, MD  END OF SESSION:  PT End of Session - 02/04/23 1035     Visit Number 1    Number of Visits 2    Date for PT Re-Evaluation 02/11/23    Authorization Type [employer] MEDICARE & Medicaid    PT Start Time 0802    PT Stop Time [phone]    PT Time Calculation (min) 45 min    Activity Tolerance Patient tolerated treatment well    Behavior During Therapy [facility] for tasks assessed/performed             Past Medical History:  Diagnosis Date   Cancer (HCC)    prostate ca   Constipation    Diabetes mellitus without complication (HCC)    type 2   Gout    HLD (hyperlipidemia)    Hypertension    Past Surgical History:  Procedure Laterality Date   AMPUTATION Left 04/17/2022   Procedure: LEFT ABOVE KNEE AMPUTATION;  Surgeon: Nadara Mustard, MD;  Location: [facility];  Service: Orthopedics;  Laterality: Left;   MASS EXCISION Left 04/17/2021   Procedure: EXCISION LEFT ELBOW MASS;  Surgeon: Allena Napoleon, MD;  Location: [facility];  Service: Plastics;  Laterality: Left;  1 hour   MASS EXCISION Left 08/10/2021   Procedure: EXCISION ELBOW MASS;  Surgeon: Allena Napoleon, MD;  Location: [facility];  Service: Plastics;  Laterality: Left;  1 hour   MASS EXCISION Right 10/16/2022   Procedure: EXCISION OF MASS RIGHT ELBOW AND ARM;  Surgeon: Santiago Glad, MD;  Location: [facility];  Service: Plastics;  Laterality: Right;  1.5 hours   Patient Active Problem List   Diagnosis Date Noted   Diabetic infection of left foot (HCC) 04/17/2022   Necrotizing fasciitis of lower leg (HCC) 04/17/2022   Dyslipidemia 04/17/2022   Essential hypertension 01/12/2021   Obesity 01/12/2021   Skin sensation disturbance 01/12/2021   Type 2 diabetes mellitus without  complication (HCC) 01/12/2021   Diabetic neuropathy (HCC) 01/12/2021   Hav (hallux abducto valgus), unspecified laterality 01/12/2021   Cough 10/31/2017   Elevated blood pressure reading 10/31/2017    ONSET DATE: 01/20/2023 MD referral to PT  REFERRING DIAG: Z89.612 (ICD-10-CM) - Left above-knee amputee   THERAPY DIAG:  Other abnormalities of gait and mobility  Unsteadiness on feet  Rationale for Evaluation and Treatment: Rehabilitation  SUBJECTIVE:   SUBJECTIVE STATEMENT: This 67yo male underwent a left above knee amputation on 04/17/2022 due to Necrotizing fascitis & infection in foot. He received his first prosthesis 05/27/2022 and had prosthetic training at [facility].  His current prosthesis is ill-fitting including falling off multiple times.  He saw Barnie Del, NP on 11/13/2022 who recommended K3 Microprocessor Prosthesis.  He presents to PT evaluation today to justify his potential.  PT will perform functional assessment today with current prosthesis and repeat the functional assessment with a microprocessor unit that he has used for >3 days.  He had radiation therapy but has completed it. He reports no weight changes.  He wears prosthesis daily from arising until close to bed time.  He denies wounds on limb but is sore on distal end.  He got new socket with suction ring suspension in 12/16/2022 and it fell off only 1x on Sunday.   Pt accompanied by:  family member  PERTINENT HISTORY: DM, neuropathy, HTN, left AKA 04/17/22, left elbow mass excision 04/17/21, 08/10/21 & 10/16/22, prostate CA, gout  PAIN:  Are you having pain? No  PRECAUTIONS: None  WEIGHT BEARING RESTRICTIONS: No  FALLS: Has patient fallen in last 6 months? 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  He lifts up to 40-50#.   PLOF: Independent, Independent with household mobility without device, and Independent with community mobility without device  PATIENT GOALS:  He wants computer prosthesis to walk without walker and do more things. 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    OBJECTIVE:  COGNITION: Overall cognitive status: Within functional limits for tasks assessed  MUSCLE LENGTH: Maisie Fus test:  Left -19 deg  POSTURE: flexed trunk  and weight shift right  LOWER EXTREMITY ROM:  ROM P:passive  A:active Right eval Left eval  Hip flexion    Hip extension    Hip abduction    Hip adduction    Hip internal rotation    Hip external rotation    Knee flexion    Knee extension    Ankle dorsiflexion    Ankle plantarflexion    Ankle inversion    Ankle eversion     (Blank rows = not tested)  LOWER EXTREMITY MMT:  MMT Right eval Left eval  Hip flexion    Hip extension  5/5  Hip abduction  5/5  Hip adduction    Hip internal rotation    Hip external rotation    Knee flexion    Knee extension    Ankle dorsiflexion    Ankle plantarflexion    Ankle inversion    Ankle eversion    At Evaluation all strength testing is grossly seated and functionally standing / gait. (Blank rows = not tested)  BED MOBILITY:  Eval on 02/04/2023: Independent all aspects  TRANSFERS: Eval on 02/04/2023: Sit to stand: Modified independence with armrests and able to stabilize without touching RW (external support) Stand to sit: Modified independence using armrests to control descent.    FUNCTIONAL TESTs:  Eval on 02/04/2023: 2x STS 21.37 no UE (requires multiple attempts);  5xSTS with UE on armrests 14.79 sec;  TUG std RW 15.93 sec; Cog (in Spanish describe work assessment of trailer)  TUG RW 18.22 sec  GAIT: Eval on 02/04/2023: Gait pattern: step through pattern, decreased step length- Right,  decreased stance time- Left, Left hip hike, and trunk flexed;  lateral whip. Excessive hip flexion with prosthetic knee flexion in swing which leads to delayed weight acceptance in loading response.  Distance walked: >300' Assistive device utilized: Environmental consultant - 2 wheeled and Transfemoral Prosthesis Level of assistance: Modified independence Gait velocity:  comfortable self-selected pace 2.36 ft/sec;  fast pace 2.58 ft/sec with significant magnification of deviations including catching RW leg when advancing prosthesis.   AMPUTEE MOBILITY PREDICTOR ASSESSMENT TOOL Initial instructions: Client is seated in a hard chair with arms. The following manoeuvres are tested with or without the use of the prosthesis.  Advise the person of each task or group of tasks prior to performance.  Please avoid unnecessary chatter throughout the test.  Safety First, no task should be performed if either the tester or client is uncertain  of a safe outcome.  AmpPro Task Scoring Criteria Points Eval on 02/04/23  1. Sitting Balance: Sit forward in a chair with arms folded across chest for 60s. Cannot sit upright independently for 60s Can sit upright independently for 60s = 0 = 1  1  2. Sitting reach:  Reach forwards and grasp the ruler.  (Tester holds ruler 12in beyond extended arms midline to the sternum) Does not attempt Cannot grasp or requires arm support Reaches forward and successfully grasps item.  = 0 = 1  = 2    2  3. Chair to chair transfer: 2 chairs at 90. Pt. may choose direction and use their upper limbs. Cannot do or requires physical assistance Performs independently, but appears unsteady Performs independently, appears to be steady and safe = 0  = 1 = 2  2  4. Arises from a chair: Ask pt. to fold arms across chest and stand. If unable, use arms or assistive device. Unable without help (physical assistance) Able, uses arms/assist device to help Able, without using arms = 0  = 1 = 2   2   5. Attempts to arise from a chair: (stopwatch ready) If attempt in no. 4. was without arms then ignore and allow another attempt without penalty. Unable without help (physical assistance) Able requires >1 attempt Able to rise one attempt = 0  = 1 = 2   1  6. Immediate Standing Balance: (first 5s) Begin timing immediately. Unsteady (staggers, moves foot, sways ) Steady using walking aid or other support Steady without walker or other support = 0 = 1  = 2  2  7. Standing Balance (30s): (stopwatch ready) For item no.'s 7 & 8, first attempt is without assistive device.  If support is required allow after first attempt Unsteady Steady but uses walking aid or other support Standing without support = 0  = 1 = 2    2  8. Single limb standing balance: (stopwatch ready) Time the duration of single limb standing on both the sound and prosthetic limb up to 30s.   Grade the quality, not the time.  *Eliminate item 8 for AMPnoPRO*  Non-prosthetic side Unsteady Steady but uses walking aid or other support for 30s Single-limb standing without support for 30s  Prosthetic Side Unsteady Steady but uses walking aid or other support for 30s Single-limb standing without support for 30s  = 0 = 1  = 2    = 0  = 1  = 2    1     1   9. Standing reach: Reach forward and grasp the ruler.  (Tester holds ruler 12in beyond extended arm(s) midline to the sternum) Does not attempt Cannot grasp or requires arm support on assistive device Reaches forward and successfully grasps item no support = 0  = 1  = 2   2  10. Nudge test: With feet as close together as possible, examiner pushes lightly on pt.'s sternum with palm of hand 3 times (toes should rise) Begins to fall Staggers, grabs, catches self ore uses assistive device Steady = 0  = 1 = 2   2  11. Eyes Closed: (at maximum position #7) If support is required grade as unsteady. Unsteady or grips assistive device Steady  without any use of assistive device = 0  = 1  0    12. Pick up objects off the floor: Pick up a pencil off the floor placed midline 12in in front of foot. Unable to  pick up object and return to standing Performs with some help (table, chair, walking aid etc) Performs independently (without help) = 0  = 1   = 2  2  13. Sitting down:  Ask pt. to fold arms across chest and sit. If unable, use arm or assistive device. Unsafe (misjudged distance, falls into chair ) Uses arms, assistive device or not a smooth motion Safe, smooth motion = 0  = 1 = 2   2  14. Initiation of gait: (immediately after told to "go") Any hesitancy or multiple attempts to start No hesitancy = 0 = 1  1  15. Step length and height: Walk a measured distance of 47ft twice (up and back). Four scores are required or two scores (a. & b.) for each leg. "Marked deviation" is defined as extreme substitute movements to avoid clearing the floor. a. Swing Foot Does not advance a minimum of 12in Advances a minimum of 12in  b. Foot Clearance Foot does not completely clear floor without deviation Foot completely clears floor without marked deviation  = 0  = 1   = 0 = 1 Prosthesis  1   1 Sound  1   1  16. Step Continuity Stopping or discontinuity between steps (stop & go gait) Steps appear continuous = 0  = 1  1  17. Turning:  180 degree turn when returning to chair. Unable to turn, requires intervention to prevent falling Greater than three steps but completes task without intervention No more than three continuous steps with or without assistive aid = 0  = 1  = 2    1  18. Variable cadence:  Walk a distance of 32ft fast as possible safely 4 times.  (Speeds may vary from slow to fast and fast to slow varying cadence) Unable to vary cadence in a controlled manner Asymmetrical increase in cadence controlled manner Symmetrical increase in speed in a controlled manner  = 0 = 1 = 2      1   19. Stepping over an obstacle: Place a movable box of 4in in height in the walking path. Cannot step over the box Catches foot, interrupts stride Steps over without interrupting stride = 0 = 1 = 2   1  20. Stairs (must have at least 2 steps):  Try to go up and down these stairs without holding on to the railing.  Don't hesitate to permit pt. to hold on to rail.  Safety First, if examiner feels that any risk in involved omit and score as 0.  Ascending Unsteady, cannot do One step at a time, or must hold on to railing or device Step over step, does not hold onto the railing or device  Descending Unsteady, cannot do One step at a time, or must hold on to railing or device Step over step, does not hold onto the railing or device  = 0  = 1 = 2    = 0  = 1 = 2   1     1   21. Assistive device selection:  Add points for the use of an assistive device if used for two or more items.  If testing without prosthesis use of appropriate assistive device is mandatory.   Bed bound Wheelchair / Parallel Bars Walker Crutches (axillary or forearm) Cane (straight or quad) None = 0 = 1 = 2 = 3 = 4 = 5    2    Total Score   AMPPRO  34 /47     K LEVEL (converted from AMP score)  AMPPRO    K1 = (15-26)      K2 = (27-36)     K3 = (37-42)     K4 = (43-47)   CARDIOVASCULAR RESPONSE: Functional activity: gait & functional assessments Pre-activity vitals: HR: 67 SpO2: 98% Post-activity vitals: HR: 73 SpO2: 98% Modified Borg scale for dyspnea: 0.5: very, very, slight shortness of breath  CURRENT PROSTHETIC WEAR ASSESSMENT: Eval on 02/04/2023:  Patient is independent with: skin check, residual limb care, prosthetic cleaning, ply sock cleaning, and correct ply sock adjustment Donning prosthesis: Modified independence Doffing prosthesis: Modified independence Prosthetic wear tolerance: pt reports daily for most of awake hours every day Prosthetic weight bearing  tolerance: >15 minutes with no limb pain noted.  Pt does report distal limb pain by end of day.  Edema: mild Residual limb condition: no open areas, normal color, temperature & moisture,  Prosthetic description: Ischial Containment Socket with flexible inner socket, silicon liner with suction ring suspension, multi-axial knee,  K code/activity level with prosthetic use: Level 3 POTENTIAL for full community with variable cadence.  ASSESSMENT:  CLINICAL IMPRESSION: Patient is a 67 y.o. male who was seen today for physical therapy evaluation to assess potential for Microprocessor Prosthesis. Patient appears to have potential to function at K3 full community with variable cadence with a Microprocessor Knee.  His current prosthesis has potential for buckling unexpectedly so he uses walker for bilateral UE support.  With a Microprocessor Knee, the computer controlled hydraulic knee would give resistance to buckling and should enable him to progress to cane or no device with training.   Plan is for patient to have a Microprocessor Knee on his socket for >3 days to allow him basic accommodation then reassess to determine functional differences. PT anticipates some changes to above functional assessments with a Microprocessor Knee and would improve even more with PT prosthetic training.   OBJECTIVE IMPAIRMENTS: Abnormal gait and decreased balance.   ACTIVITY LIMITATIONS: carrying, lifting, standing, squatting, stairs, transfers, and locomotion level  PARTICIPATION LIMITATIONS: meal prep, cleaning, community activity, occupation, and yard work  PERSONAL FACTORS: Past/current experiences and 3+ comorbidities: see PMH  are also affecting patient's functional outcome.   REHAB POTENTIAL: Good  CLINICAL DECISION MAKING: Evolving/moderate complexity  EVALUATION COMPLEXITY: Moderate   GOALS: Goals reviewed with patient? Yes   LONG TERM GOALS: Target date: 02/11/2023  Patient verbalizes understanding of  PT recommendations for prosthesis.  Baseline: SEE OBJECTIVE DATA Goal status: INITIAL PLAN:  PT FREQUENCY: 1x/week  PT DURATION: 2 weeks  PLANNED INTERVENTIONS: [id number]- PT Re-evaluation, [phone]- Prosthetic training, Patient/Family education, Physical Performance Testing, and Gait training  PLAN FOR NEXT SESSION: perform Functional Assessments with Microprocessor Prosthesis and send comparisons to Dr. Lajoyce Corners.     Vladimir Faster, PT, DPT 02/04/2023, 11:15 AM

## 2023-02-11 ENCOUNTER — Encounter: Payer: 59 | Admitting: Physical Therapy

## 2023-02-11 ENCOUNTER — Ambulatory Visit: Payer: 59 | Attending: Orthopedic Surgery | Admitting: Physical Therapy

## 2023-02-11 ENCOUNTER — Encounter: Payer: Self-pay | Admitting: Physical Therapy

## 2023-02-11 DIAGNOSIS — R2681 Unsteadiness on feet: Secondary | ICD-10-CM | POA: Insufficient documentation

## 2023-02-11 DIAGNOSIS — M6281 Muscle weakness (generalized): Secondary | ICD-10-CM | POA: Insufficient documentation

## 2023-02-11 DIAGNOSIS — R2689 Other abnormalities of gait and mobility: Secondary | ICD-10-CM | POA: Insufficient documentation

## 2023-02-11 NOTE — Therapy (Signed)
OUTPATIENT PHYSICAL THERAPY PROSTHETIC TREATMENT   Patient Name: Tim Vincent MRN: [id number] DOB:[birth year], 67 y.o., male Today's Date: 02/11/2023  PCP: Filomena Jungling, NP  REFERRING PROVIDER: Aldean Baker, MD  END OF SESSION:  PT End of Session - 02/11/23 1449     Visit Number 2    Number of Visits 2    Date for PT Re-Evaluation 02/11/23    Authorization Type [employer] MEDICARE & Medicaid    PT Start Time [phone]    PT Stop Time 1533    PT Time Calculation (min) 44 min    Activity Tolerance Patient tolerated treatment well    Behavior During Therapy WFL for tasks assessed/performed              Past Medical History:  Diagnosis Date   Cancer (HCC)    prostate ca   Constipation    Diabetes mellitus without complication (HCC)    type 2   Gout    HLD (hyperlipidemia)    Hypertension    Past Surgical History:  Procedure Laterality Date   AMPUTATION Left 04/17/2022   Procedure: LEFT ABOVE KNEE AMPUTATION;  Surgeon: Nadara Mustard, MD;  Location: [facility];  Service: Orthopedics;  Laterality: Left;   MASS EXCISION Left 04/17/2021   Procedure: EXCISION LEFT ELBOW MASS;  Surgeon: Allena Napoleon, MD;  Location: [facility];  Service: Plastics;  Laterality: Left;  1 hour   MASS EXCISION Left 08/10/2021   Procedure: EXCISION ELBOW MASS;  Surgeon: Allena Napoleon, MD;  Location: [facility];  Service: Plastics;  Laterality: Left;  1 hour   MASS EXCISION Right 10/16/2022   Procedure: EXCISION OF MASS RIGHT ELBOW AND ARM;  Surgeon: Santiago Glad, MD;  Location: [facility];  Service: Plastics;  Laterality: Right;  1.5 hours   Patient Active Problem List   Diagnosis Date Noted   Diabetic infection of left foot (HCC) 04/17/2022   Necrotizing fasciitis of lower leg (HCC) 04/17/2022   Dyslipidemia 04/17/2022   Essential hypertension 01/12/2021   Obesity 01/12/2021   Skin sensation disturbance 01/12/2021   Type 2 diabetes mellitus without  complication (HCC) 01/12/2021   Diabetic neuropathy (HCC) 01/12/2021   Hav (hallux abducto valgus), unspecified laterality 01/12/2021   Cough 10/31/2017   Elevated blood pressure reading 10/31/2017    ONSET DATE: 01/20/2023 MD referral to PT  REFERRING DIAG: V95.638 (ICD-10-CM) - Left above-knee amputee   THERAPY DIAG:  Other abnormalities of gait and mobility  Unsteadiness on feet  Muscle weakness (generalized)  Rationale for Evaluation and Treatment: Rehabilitation  SUBJECTIVE:   SUBJECTIVE STATEMENT: 02/11/2023: They put the trial microprocessor knee on 02/05/2023. It took a couple of days to get used to the knee. No falls.  He feels this leg is better. It gives him more confidence with less chance of falling.  He tried cane a little.   Evaluation:  This 67yo male underwent a left above knee amputation on 04/17/2022 due to Necrotizing fascitis & infection in foot. He received his first prosthesis 05/27/2022 and had prosthetic training at [facility].  His current prosthesis is ill-fitting including falling off multiple times.  He saw Barnie Del, NP on 11/13/2022 who recommended K3 Microprocessor Prosthesis.  He presents to PT evaluation today to justify his potential.  PT will perform functional assessment today with current prosthesis and repeat the functional assessment with a microprocessor unit that he has used for >3 days.  He had radiation therapy but has  completed it. He reports no weight changes.  He wears prosthesis daily from arising until close to bed time.  He denies wounds on limb but is sore on distal end.  He got new socket with suction ring suspension in 12/16/2022 and it fell off only 1x on Sunday.   Pt accompanied by: family member  PERTINENT HISTORY: DM, neuropathy, HTN, left AKA 04/17/22, left elbow mass excision 04/17/21, 08/10/21 & 10/16/22, prostate CA, gout  PAIN:  Are you having pain? No  PRECAUTIONS: None  WEIGHT BEARING RESTRICTIONS: No  FALLS: Has patient  fallen in last 6 months? 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  He lifts up to 40-50#.   PLOF: Independent, Independent with household mobility without device, and Independent with community mobility without device  PATIENT GOALS:  He wants computer prosthesis to walk without walker and do more things. 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    OBJECTIVE:  COGNITION: Overall cognitive status: Within functional limits for tasks assessed  MUSCLE LENGTH: Maisie Fus test:  Left -19 deg  POSTURE: flexed trunk  and weight shift right  LOWER EXTREMITY MMT:  MMT Right eval Left eval  Hip flexion    Hip extension  5/5  Hip abduction  5/5  Hip adduction    Hip internal rotation    Hip external rotation    Knee flexion    Knee extension    Ankle dorsiflexion    Ankle plantarflexion    Ankle inversion    Ankle eversion    At Evaluation all strength testing is grossly seated and functionally standing / gait. (Blank rows = not tested)  BED MOBILITY:  Eval on 02/04/2023: Independent all aspects  TRANSFERS: 02/11/2023: Sit to stand: Modified independence without UE assist and able to stabilize without touching RW (external support) Stand to sit: Modified independence without UE assist armrests to control descent.   Eval on 02/04/2023: Sit to stand: Modified independence with armrests and able to stabilize without touching RW (external support) Stand to sit: Modified independence using armrests to control descent.    FUNCTIONAL TESTs:  02/11/2023: 5xSTS without UE on armrests 20.78 sec; 5xSTS with UE assist on armrests 13.38 sec TUG std Rollator 14.43 sec; Cog (in Spanish  describe work assessment of trailer)  TUG Rollator Walker 17.75 sec  Eval on 02/04/2023: 2x STS 21.37 no UE (requires multiple attempts);  5xSTS with UE on armrests 14.79 sec;  TUG std RW 15.93 sec; Cog (in Spanish describe work assessment of trailer)  TUG RW 18.22 sec  GAIT: 02/11/2023: Gait velocity:  comfortable self-selected pace 2.82 ft/sec;  fast pace 4.23 ft/sec with minimal increase in gait deviations.  With cane comfortable self-selected pace 2.66 ft/sec; fast pace 3.85 ft/sec Pt amb >400' with cane & microprocessor transfemoral prosthesis with gross supervision. No noted loss of balance or instability.  Pt negotiated ramp with cane safely with slight knee flexion to clear prosthetic foot ascending and control descent. Pt negotiated curb with cane safely. Pt ambulated 20' without device except MPK prosthesis with contact guard assist for safety only.  He had one mild loss of balance with turning 180* that he was able to self-correct.  PT demo & verbal cues on  how to use MPK hydraulics to descend stairs. Pt was able to descend "riding" MPK hydraulics with step-to pattern with rail.  Gait pattern with cane & MPK prosthesis: step through pattern, decreased step length- Right, decreased stance time- Left but not as much difference bw LLE & RLE, mild hip hike, and trunk flexed;  no whip.  Eval on 02/04/2023: Gait pattern: step through pattern, decreased step length- Right, decreased stance time- Left, Left hip hike, and trunk flexed;  lateral whip. Excessive hip flexion with prosthetic knee flexion in swing which leads to delayed weight acceptance in loading response.  Distance walked: >300' Assistive device utilized: Environmental consultant - 2 wheeled and Transfemoral Prosthesis Level of assistance: Modified independence Gait velocity:  comfortable self-selected pace 2.36 ft/sec;  fast pace 2.58 ft/sec with significant magnification of deviations including catching RW leg when advancing prosthesis.    AMPUTEE MOBILITY PREDICTOR ASSESSMENT TOOL Initial instructions: Client is seated in a hard chair with arms. The following manoeuvres are tested with or without the use of the prosthesis.  Advise the person of each task or group of tasks prior to performance.  Please avoid unnecessary chatter throughout the test.  Safety First, no task should be performed if either the tester or client is uncertain of a safe outcome.  AmpPro Task Scoring Criteria Points Eval on 02/04/23 02/11/2023 MKP knee  1. Sitting Balance: Sit forward in a chair with arms folded across chest for 60s. Cannot sit upright independently for 60s Can sit upright independently for 60s = 0 = 1  1 1   2. Sitting reach:  Reach forwards and grasp the ruler.  (Tester holds ruler 12in beyond extended arms midline to the sternum) Does not attempt Cannot grasp or requires arm support Reaches forward and successfully grasps item.  = 0 = 1  = 2    2 2   3. Chair to chair transfer: 2 chairs at 90. Pt. may choose direction and use their upper limbs. Cannot do or requires physical assistance Performs independently, but appears unsteady Performs independently, appears to be steady and safe = 0  = 1 = 2  2 2   4. Arises from a chair: Ask pt. to fold arms across chest and stand. If unable, use arms or assistive device. Unable without help (physical assistance) Able, uses arms/assist device to help Able, without using arms = 0  = 1 = 2   2 2   5. Attempts to arise from a chair: (stopwatch ready) If attempt in no. 4. was without arms then ignore and allow another attempt without penalty. Unable without help (physical assistance) Able requires >1 attempt Able to rise one attempt = 0  = 1 = 2   1 2   6. Immediate Standing Balance: (first 5s) Begin timing immediately. Unsteady (staggers, moves foot, sways ) Steady using walking aid or other support Steady without walker or other support = 0 = 1  = 2  2 2   7. Standing  Balance (30s): (stopwatch ready) For item no.'s 7 & 8, first attempt is without assistive device.  If support is required allow after first attempt Unsteady Steady but uses walking aid or other support Standing without support = 0  = 1 = 2    2 2   8. Single limb standing balance: (stopwatch ready) Time the duration of single limb standing on both the sound and prosthetic limb up to 30s.   Grade the quality, not the time.  *Eliminate item 8 for AMPnoPRO*  Non-prosthetic side Unsteady  Steady but uses walking aid or other support for 30s Single-limb standing without support for 30s  Prosthetic Side Unsteady Steady but uses walking aid or other support for 30s Single-limb standing without support for 30s  = 0 = 1  = 2    = 0  = 1  = 2    1     1 1    1   9. Standing reach: Reach forward and grasp the ruler.  (Tester holds ruler 12in beyond extended arm(s) midline to the sternum) Does not attempt Cannot grasp or requires arm support on assistive device Reaches forward and successfully grasps item no support = 0  = 1  = 2   2 2   10. Nudge test: With feet as close together as possible, examiner pushes lightly on pt.'s sternum with palm of hand 3 times (toes should rise) Begins to fall Staggers, grabs, catches self ore uses assistive device Steady = 0  = 1 = 2   2 2   11. Eyes Closed: (at maximum position #7) If support is required grade as unsteady. Unsteady or grips assistive device Steady without any use of assistive device = 0  = 1  0  1   12. Pick up objects off the floor: Pick up a pencil off the floor placed midline 12in in front of foot. Unable to pick up object and return to standing Performs with some help (table, chair, walking aid etc) Performs independently (without help) = 0  = 1   = 2  2  13. Sitting down:  Ask pt. to fold arms across chest and sit. If unable, use arm or assistive device. Unsafe (misjudged distance, falls into  chair ) Uses arms, assistive device or not a smooth motion Safe, smooth motion = 0  = 1 = 2   2  14. Initiation of gait: (immediately after told to "go") Any hesitancy or multiple attempts to start No hesitancy = 0 = 1  1  15. Step length and height: Walk a measured distance of 39ft twice (up and back). Four scores are required or two scores (a. & b.) for each leg. "Marked deviation" is defined as extreme substitute movements to avoid clearing the floor. a. Swing Foot Does not advance a minimum of 12in Advances a minimum of 12in  b. Foot Clearance Foot does not completely clear floor without deviation Foot completely clears floor without marked deviation  = 0  = 1   = 0 = 1 Prosthesis  1   1 Sound  1   1  16. Step Continuity Stopping or discontinuity between steps (stop & go gait) Steps appear continuous = 0  = 1  1  17. Turning:  180 degree turn when returning to chair. Unable to turn, requires intervention to prevent falling Greater than three steps but completes task without intervention No more than three continuous steps with or without assistive aid = 0  = 1  = 2    2  18. Variable cadence:  Walk a distance of 82ft fast as possible safely 4 times.  (Speeds may vary from slow to fast and fast to slow varying cadence) Unable to vary cadence in a controlled manner Asymmetrical increase in cadence controlled manner Symmetrical increase in speed in a controlled manner  = 0 = 1 = 2      1  19. Stepping over an obstacle: Place a movable box of 4in in height in the walking path. Cannot step over  the box Catches foot, interrupts stride Steps over without interrupting stride = 0 = 1 = 2   1  20. Stairs (must have at least 2 steps):  Try to go up and down these stairs without holding on to the railing.  Don't hesitate to permit pt. to hold on to rail.  Safety First, if examiner feels that any risk in involved omit and score as 0.   Ascending Unsteady, cannot do One step at a time, or must hold on to railing or device Step over step, does not hold onto the railing or device  Descending Unsteady, cannot do One step at a time, or must hold on to railing or device Step over step, does not hold onto the railing or device  = 0  = 1 = 2    = 0  = 1 = 2   1     2   21. Assistive device selection:  Add points for the use of an assistive device if used for two or more items.  If testing without prosthesis use of appropriate assistive device is mandatory.   Bed bound Wheelchair / Parallel Bars Walker Crutches (axillary or forearm) Cane (straight or quad) None = 0 = 1 = 2 = 3 = 4 = 5    4    Total Score   AMPPRO    34/47 42/47   K LEVEL (converted from AMP score)  AMPPRO    K1 = (15-26)      K2 = (27-36)     K3 = (37-42)     K4 = (43-47)   CARDIOVASCULAR RESPONSE: 02/11/2023:  Functional activity: gait & functional assessments Pre-activity vitals: HR: 65 SpO2: 100% Post-activity vitals: HR: 81 SpO2: 97% Modified Borg scale for dyspnea: 0.5: very, very, slight shortness of breath   Eval:  Functional activity: gait & functional assessments Pre-activity vitals: HR: 67 SpO2: 98% Post-activity vitals: HR: 73 SpO2: 98% Modified Borg scale for dyspnea: 0.5: very, very, slight shortness of breath  CURRENT PROSTHETIC WEAR ASSESSMENT: Eval on 02/04/2023:  Patient is independent with: skin check, residual limb care, prosthetic cleaning, ply sock cleaning, and correct ply sock adjustment Donning prosthesis: Modified independence Doffing prosthesis: Modified independence Prosthetic wear tolerance: pt reports daily for most of awake hours every day Prosthetic weight bearing tolerance: >15 minutes with no limb pain noted.  Pt does report distal limb pain by end of day.  Edema: mild Residual limb condition: no open areas, normal color, temperature & moisture,  Prosthetic description: Ischial  Containment Socket with flexible inner socket, silicon liner with suction ring suspension, multi-axial knee,  K code/activity level with prosthetic use: Level 3 POTENTIAL for full community with variable cadence.  ASSESSMENT:  CLINICAL IMPRESSION: Patient has had loaner Microprocessor Knee (MPK) Prosthesis for 6 days with no training.  He had significant differences in all functional tests with MPK even without formal training. With formal training, he would improve his function even more.  He was able to ambulate with cane safely. With his other prosthetic knee, he required use of walker.  He was able to ambulate up to 20' without device.  Gait velocity improved both self-selected pace & fast pace and the difference between self-selected & fast pace is now at a functional level to enable crossing street, etc. The AmpPro improved to 42/47 from 34/47.  The 42/47 indicates high K3 level of function.  All testing indicates that this patient would benefit from a Microprocessor Knee prosthesis. This MPK  prosthesis would allow improved function at full community level with ability to vary his cadence as needed. It would have stumble recovery which would reduce risk of falling.  He should also be able to use prosthesis for work related skills.    OBJECTIVE IMPAIRMENTS: Abnormal gait and decreased balance.   ACTIVITY LIMITATIONS: carrying, lifting, standing, squatting, stairs, transfers, and locomotion level  PARTICIPATION LIMITATIONS: meal prep, cleaning, community activity, occupation, and yard work  PERSONAL FACTORS: Past/current experiences and 3+ comorbidities: see PMH  are also affecting patient's functional outcome.   REHAB POTENTIAL: Good  CLINICAL DECISION MAKING: Evolving/moderate complexity  EVALUATION COMPLEXITY: Moderate   GOALS: Goals reviewed with patient? Yes   LONG TERM GOALS: Target date: 02/11/2023  Patient verbalizes understanding of PT recommendations for prosthesis.   Baseline: SEE OBJECTIVE DATA Goal status: MET 02/11/2023 PLAN:  PT FREQUENCY: 1x/week  PT DURATION: 2 weeks  PLANNED INTERVENTIONS: [id number]- PT Re-evaluation, [phone]- Prosthetic training, Patient/Family education, Physical Performance Testing, and Gait training  PLAN FOR NEXT SESSION:  discharge PT.  Send recommendations to Dr. Lajoyce Corners.  PT is recommending a Microprocessor Knee Prosthesis.   Vladimir Faster, PT, DPT 02/11/2023, 4:12 PM

## 2023-02-12 ENCOUNTER — Ambulatory Visit: Payer: 59 | Admitting: Podiatry

## 2023-02-16 DIAGNOSIS — M726 Necrotizing fasciitis: Secondary | ICD-10-CM | POA: Diagnosis not present

## 2023-02-16 DIAGNOSIS — L089 Local infection of the skin and subcutaneous tissue, unspecified: Secondary | ICD-10-CM | POA: Diagnosis not present

## 2023-02-16 DIAGNOSIS — E11628 Type 2 diabetes mellitus with other skin complications: Secondary | ICD-10-CM | POA: Diagnosis not present

## 2023-02-21 DIAGNOSIS — Z0000 Encounter for general adult medical examination without abnormal findings: Secondary | ICD-10-CM | POA: Diagnosis not present

## 2023-02-21 DIAGNOSIS — E119 Type 2 diabetes mellitus without complications: Secondary | ICD-10-CM | POA: Diagnosis not present

## 2023-02-21 DIAGNOSIS — S99921A Unspecified injury of right foot, initial encounter: Secondary | ICD-10-CM | POA: Diagnosis not present

## 2023-02-21 IMAGING — DX DG ELBOW COMPLETE 3+V*L*
4 series · 4 of 4 positions shown · non-contrast
Comparison: None.

CLINICAL DATA: Bilateral elbow mass is chronically getting larger.
Suspect gout.

EXAM:
LEFT ELBOW - COMPLETE 3+ VIEW

[elbow ap]
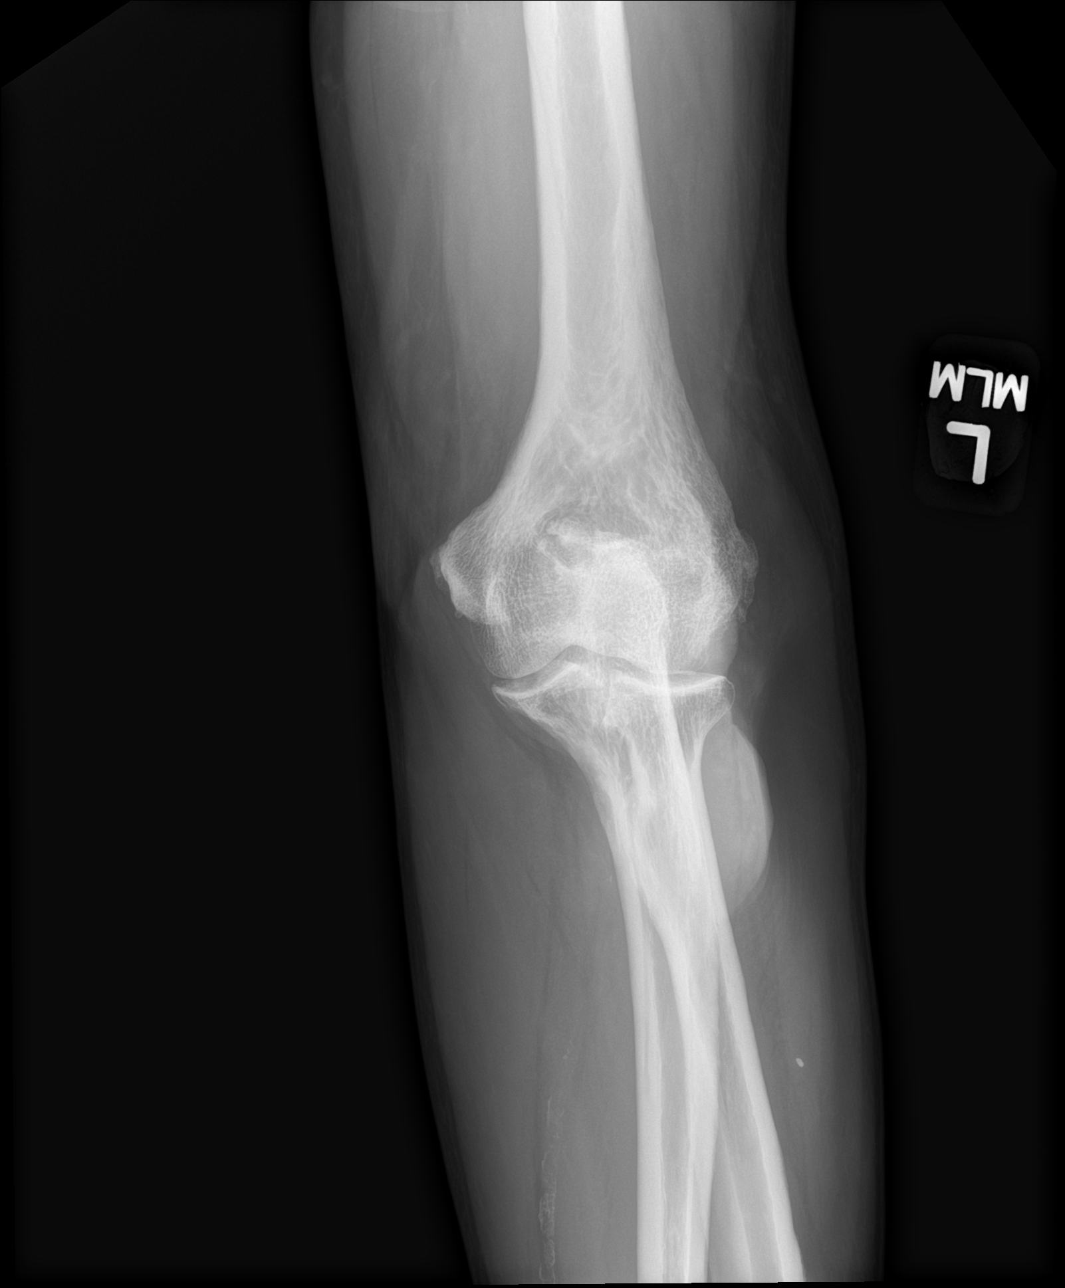

[elbow obl (1 of 2)]
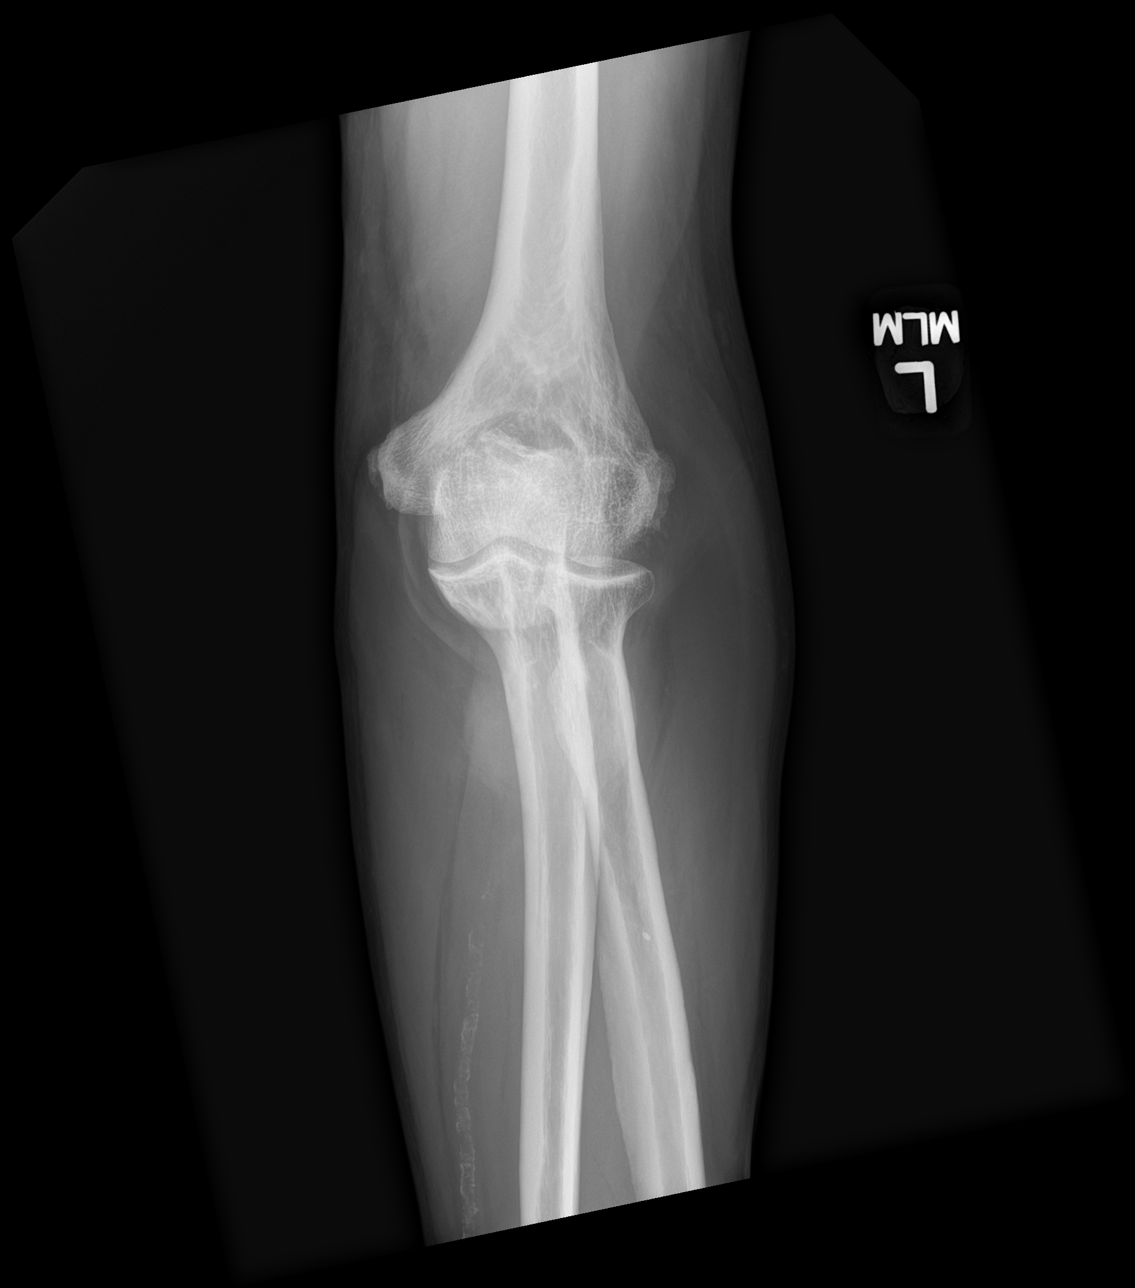

[elbow obl (2 of 2)]
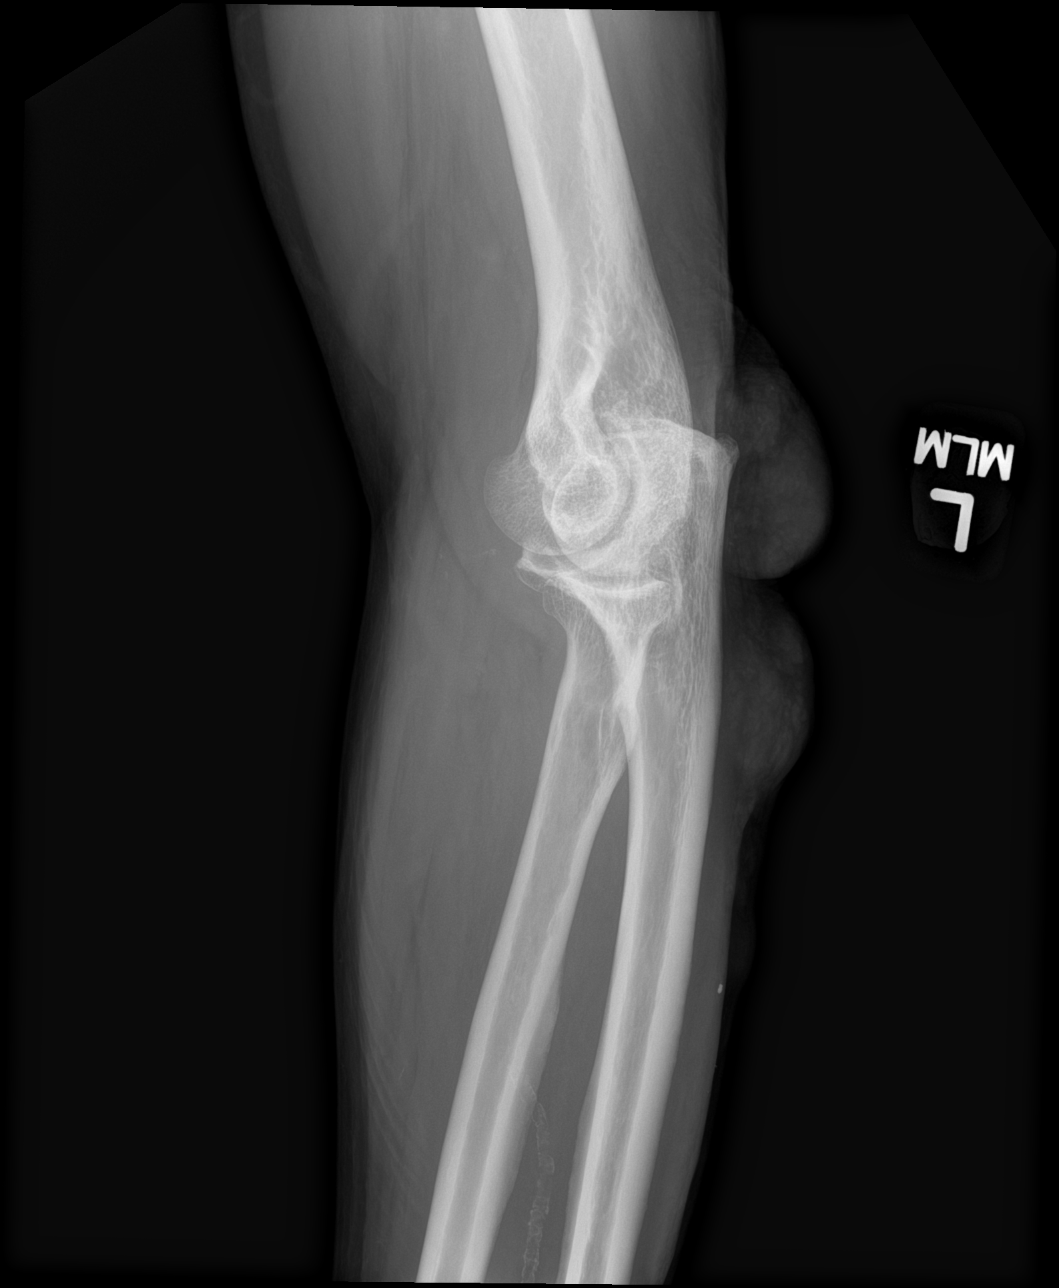

[elbow lat]
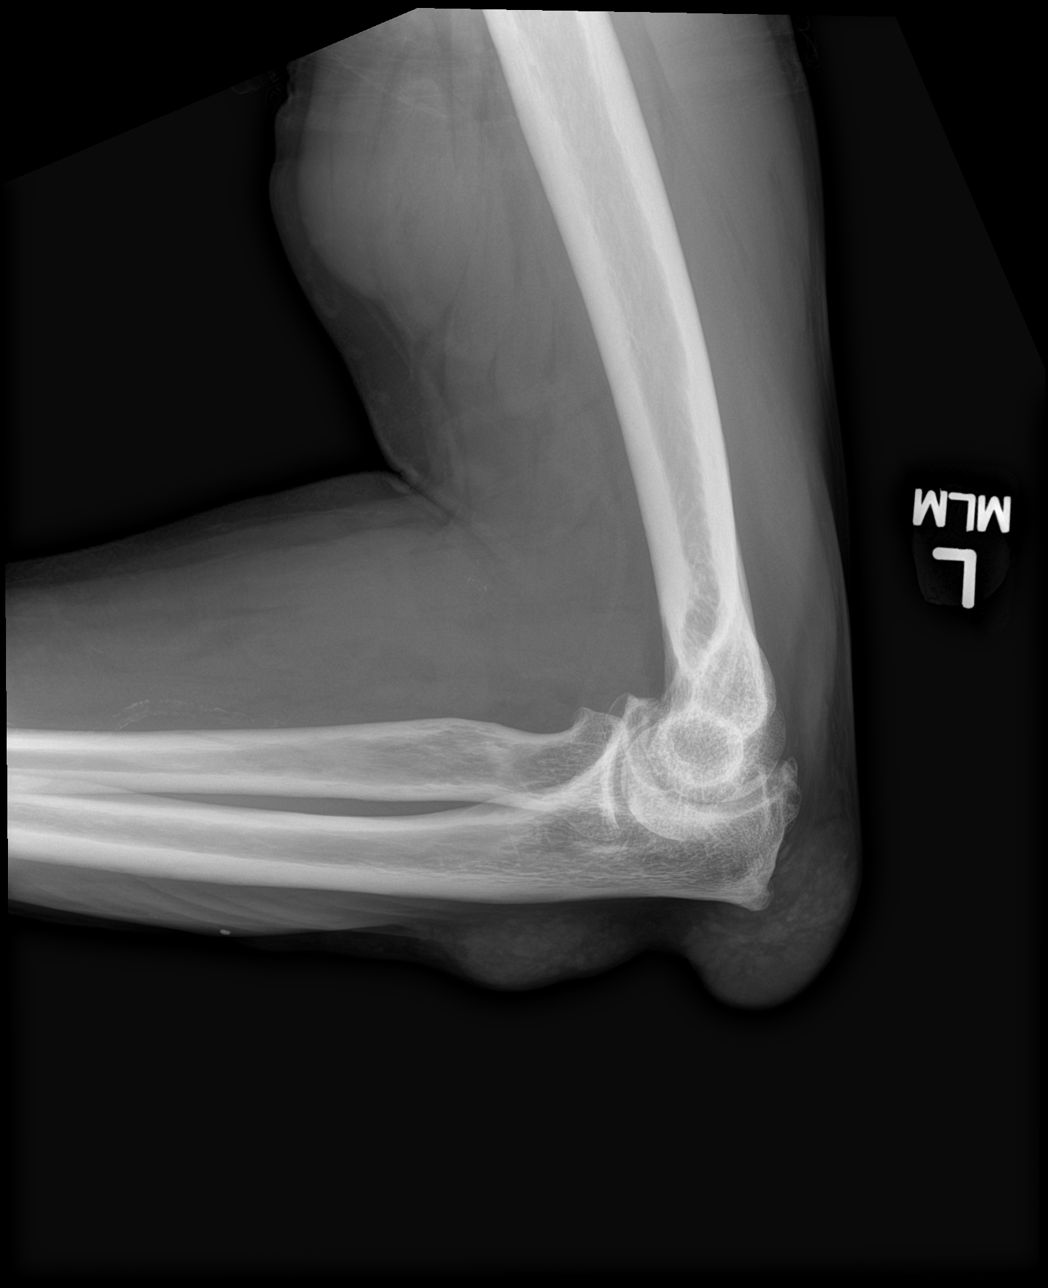

[4 of 4 positions shown; findings below may reference images not displayed]

FINDINGS: There mild degenerate changes of the elbow joint. No focal lytic or
sclerotic lesion. No fracture or dislocation. Two prominent masslike
areas within the soft tissues of the extensor surface of the elbow
with increased density compared to the adjacent subcutaneous fat.
Small vessel atherosclerotic disease is present.
IMPRESSION: 1. No acute findings.
2. Two prominent masslike areas within the soft tissues of the
extensor surface of the elbow. Findings are nonspecific, although
could be due to gout as suspected per history.
3. Mild degenerative change of the elbow.

## 2023-03-11 DIAGNOSIS — Z89612 Acquired absence of left leg above knee: Secondary | ICD-10-CM | POA: Diagnosis not present

## 2023-03-20 ENCOUNTER — Other Ambulatory Visit: Payer: Self-pay

## 2023-03-20 ENCOUNTER — Ambulatory Visit: Payer: 59 | Admitting: Podiatry

## 2023-03-20 ENCOUNTER — Telehealth: Payer: Self-pay | Admitting: Orthopedic Surgery

## 2023-03-20 DIAGNOSIS — Z89612 Acquired absence of left leg above knee: Secondary | ICD-10-CM

## 2023-03-20 NOTE — Telephone Encounter (Signed)
 Patient's daughter came by. She says dad will  need outpatient PT. Would like a referral sent to [facility]. He has MCD and can not see Robin.

## 2023-03-20 NOTE — Telephone Encounter (Signed)
 Order in chart for PT left AKA prosthetic gait training.

## 2023-03-26 ENCOUNTER — Other Ambulatory Visit: Payer: Self-pay

## 2023-03-26 ENCOUNTER — Ambulatory Visit: Payer: 59 | Attending: Orthopedic Surgery | Admitting: Physical Therapy

## 2023-03-26 ENCOUNTER — Encounter: Payer: Self-pay | Admitting: Physical Therapy

## 2023-03-26 VITALS — BP 165/80 | HR 63

## 2023-03-26 DIAGNOSIS — R2689 Other abnormalities of gait and mobility: Secondary | ICD-10-CM | POA: Diagnosis not present

## 2023-03-26 DIAGNOSIS — M6281 Muscle weakness (generalized): Secondary | ICD-10-CM | POA: Insufficient documentation

## 2023-03-26 DIAGNOSIS — R2681 Unsteadiness on feet: Secondary | ICD-10-CM | POA: Insufficient documentation

## 2023-03-26 NOTE — Therapy (Signed)
OUTPATIENT PHYSICAL THERAPY PROSTHETIC EVALUATION    Patient Name: Tim Vincent MRN: [id number] DOB:[birth year], 68 y.o., male Today's Date: 03/27/2023  PCP: Filomena Jungling, NP  REFERRING PROVIDER: Aldean Baker, MD  END OF SESSION:  PT End of Session - 03/27/23 0801     Visit Number 1    Number of Visits 6    Date for PT Re-Evaluation 05/22/23   POC longer to accomodate 2 weeks patient is out of town   Authorization Type [employer] MEDICARE & Medicaid    PT Start Time 1615    PT Stop Time 1635    PT Time Calculation (min) 20 min    Equipment Utilized During Treatment Other (comment)   gait belt not used as no functional measures performed due to safety concerns for elvated BP readings   Activity Tolerance Patient tolerated treatment well    Behavior During Therapy [facility] for tasks assessed/performed            Past Medical History:  Diagnosis Date   Cancer (HCC)    prostate ca   Constipation    Diabetes mellitus without complication (HCC)    type 2   Gout    HLD (hyperlipidemia)    Hypertension    Past Surgical History:  Procedure Laterality Date   AMPUTATION Left 04/17/2022   Procedure: LEFT ABOVE KNEE AMPUTATION;  Surgeon: Nadara Mustard, MD;  Location: [facility] OR;  Service: Orthopedics;  Laterality: Left;   MASS EXCISION Left 04/17/2021   Procedure: EXCISION LEFT ELBOW MASS;  Surgeon: Allena Napoleon, MD;  Location: [facility];  Service: Plastics;  Laterality: Left;  1 hour   MASS EXCISION Left 08/10/2021   Procedure: EXCISION ELBOW MASS;  Surgeon: Allena Napoleon, MD;  Location: [facility];  Service: Plastics;  Laterality: Left;  1 hour   MASS EXCISION Right 10/16/2022   Procedure: EXCISION OF MASS RIGHT ELBOW AND ARM;  Surgeon: Santiago Glad, MD;  Location: [facility] OR;  Service: Plastics;  Laterality: Right;  1.5 hours   Patient Active Problem List   Diagnosis Date Noted   Diabetic infection of left foot (HCC) 04/17/2022    Necrotizing fasciitis of lower leg (HCC) 04/17/2022   Dyslipidemia 04/17/2022   Essential hypertension 01/12/2021   Obesity 01/12/2021   Skin sensation disturbance 01/12/2021   Type 2 diabetes mellitus without complication (HCC) 01/12/2021   Diabetic neuropathy (HCC) 01/12/2021   Hav (hallux abducto valgus), unspecified laterality 01/12/2021   Cough 10/31/2017   Elevated blood pressure reading 10/31/2017    ONSET DATE: 03/20/2023 MD referral to PT  REFERRING DIAG: W29.562 (ICD-10-CM) - Left above-knee amputee   THERAPY DIAG:  Other abnormalities of gait and mobility - Plan: PT plan of care cert/re-cert, CANCELED: PT plan of care cert/re-cert  Unsteadiness on feet - Plan: PT plan of care cert/re-cert, CANCELED: PT plan of care cert/re-cert  Muscle weakness (generalized) - Plan: PT plan of care cert/re-cert, CANCELED: PT plan of care cert/re-cert  Rationale for Evaluation and Treatment: Rehabilitation  SUBJECTIVE:   SUBJECTIVE STATEMENT: This 68yo male known previously to this clinic and underwent a left above knee amputation on 04/17/2022 due to Necrotizing fascitis & infection in foot. He received his first prosthesis 05/27/2022 and had prosthetic training at [facility]. Patient received new microprocessor knee approximately 15 days ago and has been trying to use at home but feels like he would benefit from PT to improve comfort with knee knee. Patinet is using hurrycane  to get around at this time with intermittent furniture surfing . Patient denies falls and nar falls since getting the new microprocessor. Patient reports greatest challenges being on uneven surfaces.   Pt accompanied by: family member daughter interprets for session and is [facility] certified to do so, patient denies another interpreter   PERTINENT HISTORY: DM, neuropathy, HTN, left AKA 04/17/22, left elbow mass excision 04/17/21, 08/10/21 & 10/16/22, prostate CA, gout  PAIN:  Are you having pain? No  PRECAUTIONS:  None  WEIGHT BEARING RESTRICTIONS: No  FALLS: Has patient fallen in last 6 months? 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  He lifts up to 40-50#.   PLOF: Independent, Independent with household mobility without device, and Independent with community mobility without device  PATIENT GOALS:  "To walk without the cane."  OBJECTIVE:   COGNITION: Overall cognitive status: Within functional limits for tasks assessed  POSTURE: flexed trunk  and weight shift right  BED MOBILITY:  Independent all aspects and no changes reported   TRANSFERS: Sit to stand: Modified independence with armrests and able to stabilize Stand to sit: Modified independence using armrests to control descent.    VITALS: Vitals:   03/26/23 1625 03/26/23 1628  BP: (!) 165/80 (!) 165/80  Pulse: 63 63    Seated at rest at start of session  FUNCTIONAL TESTs:  Held due to elevated BP readings; recommend follow up with PCP for management   GAIT: Gait pattern: step through pattern, decreased step length- Right, decreased stance time- Left, Left hip hike, and trunk flexed;  lateral whip. Excessive hip flexion with prosthetic knee flexion in swing which leads to delayed weight acceptance in loading response.  Distance walked: >300' Assistive device utilized: Environmental consultant - 2 wheeled and Transfemoral Prosthesis Level of assistance: Modified independence Gait velocity:  comfortable self-selected pace 2.36 ft/sec;  fast pace 2.58 ft/sec with significant magnification of deviations including catching RW leg when advancing prosthesis.   CURRENT PROSTHETIC WEAR ASSESSMENT: Patient is independent with: skin check,  residual limb care, prosthetic cleaning, ply sock cleaning, and correct ply sock adjustment Donning prosthesis: Modified independence Doffing prosthesis: Modified independence Prosthetic wear tolerance: pt reports daily for most of awake hours every day Prosthetic weight bearing tolerance: No reported changes Edema: To be assessed  Residual limb condition: no open areas, normal color, temperature & moisture,  Prosthetic description: Ischial Containment Socket with flexible inner socket, silicon liner with suction ring suspension, microprocessor knee K code/activity level with prosthetic use: Level 3   ASSESSMENT:  CLINICAL IMPRESSION: Patient is a 68 y.o. male who was seen today for physical therapy evaluation to improve safety with new microprocessor knee for L AKA. Patient ambulates into session with hurrycane; however, requires intermittent use of walls and furniture in clinic to stabilize. Given presentation in today's session, patient will benefit from skilled physical therapy services to improve safety with new AD. Held functional testing in today's session due to elevated BP readings but will plan to assess further in future session. Patient will benefit from skilled physical therapy services to address impairments and progress in safety towards LRAD to progress towards personal LTG.   OBJECTIVE IMPAIRMENTS: Abnormal gait and decreased balance.   ACTIVITY LIMITATIONS:  carrying, lifting, standing, squatting, stairs, transfers, and locomotion level  PARTICIPATION LIMITATIONS: meal prep, cleaning, community activity, occupation, and yard work  PERSONAL FACTORS: Past/current experiences and 3+ comorbidities: see PMH  are also affecting patient's functional outcome.   REHAB POTENTIAL: Good  CLINICAL DECISION MAKING: Evolving/moderate complexity  EVALUATION COMPLEXITY: Moderate   GOALS: Goals reviewed with patient? Yes  SHORT TERM GOALS: Target date: 04/24/2023  Patient will  demonstrate independence with initial HEP to continue to progress between physical therapy sessions.   Baseline: To be provided  Goal status: INITIAL  2. TUG to be assessed and LTG written Baseline: To be assessed  Goal status: INITIAL  3. to be assessed and LTG written Baseline: To be assessed formally - furniture surfing short clinic distances with hurrycane into clinic Goal status: INITIAL  LONG TERM GOALS: Target date: 05/22/2023   Patient will report demonstrate independence with final HEP in order to maintain current gains and continue to progress after physical therapy discharge.   Baseline: To be provided  Goal status: INITIAL  2. TUG to be assessed and LTG written Baseline: To be assessed  Goal status: INITIAL  3. to be assessed and LTG written Baseline: To be assessed formally - furniture surfing short clinic distances with hurrycane into clinic Goal status: INITIAL  PLAN:  PT FREQUENCY: 1x/week  PT DURATION: 5 weeks with POC written out longer as patient will be out of town for few weeks  PLANNED INTERVENTIONS: [id number]- PT Re-evaluation, [phone]- Prosthetic training, Patient/Family education, Physical Performance Testing, and Gait training  PLAN FOR NEXT SESSION: assess and TUG and write LTG to improve safety with LRAD, provide updated HEP and review former HEP as indicated, monitor BP for safety during sessions    Carmelia Bake, PT, DPT 03/27/2023, 8:23 AM

## 2023-03-27 ENCOUNTER — Encounter: Payer: Self-pay | Admitting: Physical Therapy

## 2023-03-27 ENCOUNTER — Other Ambulatory Visit: Payer: Self-pay | Admitting: Family

## 2023-04-23 ENCOUNTER — Ambulatory Visit: Payer: 59 | Attending: Orthopedic Surgery | Admitting: Physical Therapy

## 2023-04-24 ENCOUNTER — Ambulatory Visit (INDEPENDENT_AMBULATORY_CARE_PROVIDER_SITE_OTHER): Payer: 59

## 2023-04-24 ENCOUNTER — Ambulatory Visit (INDEPENDENT_AMBULATORY_CARE_PROVIDER_SITE_OTHER): Payer: 59 | Admitting: Podiatry

## 2023-04-24 DIAGNOSIS — S78119A Complete traumatic amputation at level between unspecified hip and knee, initial encounter: Secondary | ICD-10-CM | POA: Diagnosis not present

## 2023-04-24 DIAGNOSIS — M21611 Bunion of right foot: Secondary | ICD-10-CM | POA: Diagnosis not present

## 2023-04-24 DIAGNOSIS — M79671 Pain in right foot: Secondary | ICD-10-CM | POA: Diagnosis not present

## 2023-04-24 DIAGNOSIS — Z794 Long term (current) use of insulin: Secondary | ICD-10-CM | POA: Diagnosis not present

## 2023-04-24 DIAGNOSIS — M21619 Bunion of unspecified foot: Secondary | ICD-10-CM

## 2023-04-24 DIAGNOSIS — E119 Type 2 diabetes mellitus without complications: Secondary | ICD-10-CM | POA: Diagnosis not present

## 2023-04-24 DIAGNOSIS — R809 Proteinuria, unspecified: Secondary | ICD-10-CM | POA: Diagnosis not present

## 2023-04-24 DIAGNOSIS — I10 Essential (primary) hypertension: Secondary | ICD-10-CM | POA: Diagnosis not present

## 2023-04-24 DIAGNOSIS — Z89512 Acquired absence of left leg below knee: Secondary | ICD-10-CM

## 2023-04-24 DIAGNOSIS — N184 Chronic kidney disease, stage 4 (severe): Secondary | ICD-10-CM | POA: Diagnosis not present

## 2023-04-24 NOTE — Progress Notes (Signed)
Patient presents to the office today for diabetic shoe and insole measuring.  Patient was measured with brannock device to determine size and width for 1 pair of extra depth shoes and foam casted for 3 pair of insoles.   Documentation of medical necessity will be sent to patient's treating diabetic doctor to verify and sign.   Patient's diabetic provider: Tracey Harries MD   Shoes and insoles will be ordered at that time and patient will be notified for an appointment for fitting when they arrive.   Shoe size (per patient): 9.5 Brannock measurement: 9 Shoe choice:   [id number]/[id number] Shoe size ordered: 9.5WD  Ppw/ ABN signed

## 2023-04-24 NOTE — Progress Notes (Signed)
Subjective: Chief Complaint  Patient presents with   Foot Pain    diabetic with 3rd R toe injury, DOI: 01/28/23;S/P left above knee amputation. Patient states is in need for diabetic shoes. No visible open wounds or ulcers at this time redness on the bunion area of right foot.   68 y.o. male with the above concerns. He injured his 3rd toe on the right on 01/28/2023 . He states he could not feel the foot and he was driving to [place] and he did not remove the shoe for 2 days and it was rubbing. When he got to [place] he noticed an open wound. He did see his PCP and was on antibiotics and he used a cream. It since healed.  He presents today requesting diabetic shoes.  Last A1c 9.7 on 02/21/2023 BS- 240  Objective: AAO x3, NAD DP/PT palpable on the right side.  CRT less than 3 seconds. Nails on the right foot are dystrophic with yellow discoloration but not significantly elongated today or causing any pain.  There is no edema, erythema between the sites. There is no open lesion identified.  No areas of discomfort.  No increase in edema.  Particularly area of the third toe open appears with this is healed. Left below-knee amputation No pain with calf compression, swelling, warmth, erythema  Assessment: Diabetic foot exam, history of left below-knee amputation  Plan: -All treatment options discussed with the patient including all alternatives, risks, complications.  -X-rays obtained reviewed.  Vessel calcifications present.  Bunion is noted.  Arthritic changes are also present.  No evidence of acute fracture. -Currently no open lesions but discussed importance of checking feet daily -She was seen today by Nicki Guadalajara, pedorthist for measurement of diabetic shoes -Daily foot inspection, glucose control. -Patient encouraged to call the office with any questions, concerns, change in symptoms.

## 2023-05-06 ENCOUNTER — Other Ambulatory Visit: Payer: Self-pay | Admitting: Family

## 2023-05-16 ENCOUNTER — Ambulatory Visit: Payer: 59 | Admitting: Physical Therapy

## 2023-06-06 ENCOUNTER — Ambulatory Visit

## 2023-06-18 ENCOUNTER — Other Ambulatory Visit: Payer: Self-pay | Admitting: Family

## 2023-06-26 ENCOUNTER — Ambulatory Visit: Attending: Orthopedic Surgery | Admitting: Physical Therapy

## 2023-06-26 ENCOUNTER — Encounter: Payer: Self-pay | Admitting: Physical Therapy

## 2023-06-26 VITALS — BP 161/65 | HR 69

## 2023-06-26 DIAGNOSIS — R2681 Unsteadiness on feet: Secondary | ICD-10-CM | POA: Diagnosis not present

## 2023-06-26 DIAGNOSIS — M6281 Muscle weakness (generalized): Secondary | ICD-10-CM | POA: Diagnosis not present

## 2023-06-26 DIAGNOSIS — Z89612 Acquired absence of left leg above knee: Secondary | ICD-10-CM | POA: Insufficient documentation

## 2023-06-26 DIAGNOSIS — R2689 Other abnormalities of gait and mobility: Secondary | ICD-10-CM | POA: Insufficient documentation

## 2023-06-26 DIAGNOSIS — R0989 Other specified symptoms and signs involving the circulatory and respiratory systems: Secondary | ICD-10-CM | POA: Diagnosis not present

## 2023-06-26 DIAGNOSIS — R918 Other nonspecific abnormal finding of lung field: Secondary | ICD-10-CM | POA: Diagnosis not present

## 2023-06-26 DIAGNOSIS — M7989 Other specified soft tissue disorders: Secondary | ICD-10-CM | POA: Diagnosis not present

## 2023-06-26 DIAGNOSIS — M19011 Primary osteoarthritis, right shoulder: Secondary | ICD-10-CM | POA: Diagnosis not present

## 2023-06-26 DIAGNOSIS — I517 Cardiomegaly: Secondary | ICD-10-CM | POA: Diagnosis not present

## 2023-06-26 DIAGNOSIS — I10 Essential (primary) hypertension: Secondary | ICD-10-CM | POA: Diagnosis not present

## 2023-06-26 DIAGNOSIS — Z9181 History of falling: Secondary | ICD-10-CM | POA: Insufficient documentation

## 2023-06-26 DIAGNOSIS — R221 Localized swelling, mass and lump, neck: Secondary | ICD-10-CM | POA: Diagnosis not present

## 2023-06-26 DIAGNOSIS — J988 Other specified respiratory disorders: Secondary | ICD-10-CM | POA: Diagnosis not present

## 2023-06-26 DIAGNOSIS — E119 Type 2 diabetes mellitus without complications: Secondary | ICD-10-CM | POA: Diagnosis not present

## 2023-06-26 NOTE — Therapy (Signed)
 OUTPATIENT PHYSICAL THERAPY PROSTHETIC EVALUATION    Patient Name: Tim Vincent MRN: [id number] DOB:August 23, 1955, 68 y.o., male Today's Date: 06/26/2023  PCP: Nita Bast, NP  REFERRING PROVIDER: Gearldean Keepers, MD  END OF SESSION:  PT End of Session - 06/26/23 1453     Visit Number 1    Number of Visits 9    Date for PT Re-Evaluation 08/07/23   Due to delay in scheduling   Authorization Type [employer] Dual Complete    PT Start Time 1450   Pt arrived late   PT Stop Time 1515    PT Time Calculation (min) 25 min    Equipment Utilized During Treatment Gait belt   L AKA w/microprocessor knee   Activity Tolerance Patient tolerated treatment well    Behavior During Therapy WFL for tasks assessed/performed             Past Medical History:  Diagnosis Date   Cancer (HCC)    prostate ca   Constipation    Diabetes mellitus without complication (HCC)    type 2   Gout    HLD (hyperlipidemia)    Hypertension    Past Surgical History:  Procedure Laterality Date   AMPUTATION Left 04/17/2022   Procedure: LEFT ABOVE KNEE AMPUTATION;  Surgeon: Tim Ford, MD;  Location: [facility];  Service: Orthopedics;  Laterality: Left;   MASS EXCISION Left 04/17/2021   Procedure: EXCISION LEFT ELBOW MASS;  Surgeon: Tim Bonito, MD;  Location: [facility];  Service: Plastics;  Laterality: Left;  1 hour   MASS EXCISION Left 08/10/2021   Procedure: EXCISION ELBOW MASS;  Surgeon: Tim Bonito, MD;  Location: [facility];  Service: Plastics;  Laterality: Left;  1 hour   MASS EXCISION Right 10/16/2022   Procedure: EXCISION OF MASS RIGHT ELBOW AND ARM;  Surgeon: Tim Ferguson, MD;  Location: [facility];  Service: Plastics;  Laterality: Right;  1.5 hours   Patient Active Problem List   Diagnosis Date Noted   Diabetic infection of left foot (HCC) 04/17/2022   Necrotizing fasciitis of lower leg (HCC) 04/17/2022   Dyslipidemia 04/17/2022   Essential hypertension 01/12/2021    Obesity 01/12/2021   Skin sensation disturbance 01/12/2021   Type 2 diabetes mellitus without complication (HCC) 01/12/2021   Diabetic neuropathy (HCC) 01/12/2021   Hav (hallux abducto valgus), unspecified laterality 01/12/2021   Cough 10/31/2017   Elevated blood pressure reading 10/31/2017    ONSET DATE: 03/20/2023 MD referral to PT  REFERRING DIAG: Z89.612 (ICD-10-CM) - Left above-knee amputee   THERAPY DIAG:  Other abnormalities of gait and mobility  Unsteadiness on feet  Muscle weakness (generalized)  History of falling  Rationale for Evaluation and Treatment: Rehabilitation  SUBJECTIVE:   SUBJECTIVE STATEMENT: Pt, who is well known to this clinic, presents w/ microprocessor knee that he has had for >3 months and single axillary crutch. Daughter provides majority of subjective as she is pt's interpreter. Daughter reports pt has had >10 falls recently and was recommended to return to therapy. Pt has not seen his prosthetist since these falls and reports the knee will make a loud "click" sound. Pt states that he feels the click noise is a good thing, meaning the knee will not buckle on him. Pt reports he feels more stable w/axillary crutch than the cane and is fearful of the knee buckling on him, as this is what caused all his falls.    Pt accompanied by: family member daughter  interprets for session and is [facility] certified to do so, patient denies another interpreter   PERTINENT HISTORY: DM, neuropathy, HTN, left AKA 04/17/22, left elbow mass excision 04/17/21, 08/10/21 & 10/16/22, prostate CA, gout  PAIN:  Are you having pain? No  PRECAUTIONS: None  WEIGHT BEARING RESTRICTIONS: No  FALLS: Has patient fallen in last 6 months? Yes. 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  He lifts up to 40-50#.   PLOF: Independent, Independent with household mobility without device, and Independent with community mobility without device  PATIENT GOALS:  "walk without any support"   OBJECTIVE:   COGNITION: Overall cognitive status: Within functional limits for tasks assessed  POSTURE: rounded shoulders, forward head, and flexed trunk   BED MOBILITY:  Independent all aspects and no changes reported   TRANSFERS: Sit to stand: Modified independence with armrests and able to stabilize Stand to sit: Modified independence using armrests to control descent.    VITALS: Vitals:   06/26/23 1501  BP: (!) 161/65  Pulse: 69       FUNCTIONAL TESTs:   [facility] PT Assessment - 06/26/23 1504       Transfers   Five time sit to stand comments  16.03s   No UE support, extremely unstable, retropulsion, lacking full hip extension     Ambulation/Gait   Gait velocity 32.8' over 11.44s = 2.87 ft/s   w/single axilalry crutch, CGA, forward flexed posture             GAIT: Gait pattern: step through pattern, decreased step length- Right, decreased stance time- Left, Left hip hike, lateral hip instability, lateral lean- Right, and trunk flexed; Excessive hip flexion of LLE to ensure L knee is locked out prior to IC as pt is fearful of knee buckling  Distance walked: Various clinic distances  Assistive device utilized: Environmental consultant - 2 wheeled and Transfemoral Prosthesis Level of assistance: Modified independence Gait velocity:  comfortable self-selected pace 2.87 ft/s, which is improved from previous assessment of 2.36 ft/sec taken 03/2023  CURRENT PROSTHETIC WEAR ASSESSMENT: Patient is independent with: skin check, residual limb care, prosthetic cleaning, ply sock cleaning, and correct ply sock adjustment Donning prosthesis: Modified  independence Doffing prosthesis: Modified independence Prosthetic wear tolerance: pt reports daily for most of awake hours every day Prosthetic weight bearing tolerance: No reported changes Edema: To be assessed  Residual limb condition: not assessed  Prosthetic description: Ischial Containment Socket with flexible inner socket, silicon liner with suction ring suspension, microprocessor knee K code/activity level with prosthetic use: Level 3   TREATMENT:  Self-care/home management  Educated pt and daughter on importance of following-up w/Delaney at [facility] to ensure microprocessor knee is working properly due to number of falls pt has had on the knee. Informed pt PT will hold off on scheduling until knee is assessed as this could be why pt is falling. Pt's self-selected gait speed is improved from previous assessment. Pt's daughter to call back and schedule PT once pt sees [facility], pt and daughter verbalized understanding.   PATIENT EDUCATION: Education details:  See self-care section  Person educated: Patient and Child(ren) Education method: Explanation Education comprehension: verbalized understanding  HOME EXERCISE PROGRAM: To be reviewed: from previous POC   Access Code: [email or web address] URL: [email or web address] Date: 07/15/2022 Prepared by: Lorita Rosa   Exercises - Prone Hip Flexor Stretch with Towel Roll (AKA)  - 1 x daily - 7 x weekly - 1 sets - 1 reps - 10 minutes hold - Standing Hip Flexion March  - 1 x daily - 7 x weekly - 3 sets - 10 reps - Standing Hip Abduction  - 1 x daily - 7 x weekly - 3 sets - 10 reps - Standing Hip Extension with Chair  - 1 x daily - 7 x weekly - 3 sets - 10 reps - Sidelying Hip Abduction (AKA)  - 1 x daily - 7 x weekly - 3 sets - 10 reps   AMPUTEE SINK HEP (Provided to pt and daughter in [place] and spanish 5/16) Haz cada ejercicio 1-2 veces al da. Haz cada ejercicio 10 repeticiones. Mantenga cada ejercicio durante 2 segundos  para sentir su ubicacin.   EN EL FREGADERO ENCUENTRE SU POSICIN EN LA LNEA MEDIA Y COLOQUE LOS PIES A LA IGUAL DISTANCIA DE LA LNEA MEDIA. Trate de encontrar esta posicin cuando est quieto para Arts development officer.   UTILICE CINTA EN EL PISO PARA MARCAR LA POSICIN DE LA LNEA MEDIA. Tambin debe intentar sentir con la extremidad la presin en la cavidad. Ests tratando de sentir con las extremidades lo que solas sentir con la planta del pie.   1. Desplazamiento de lado a lado: moviendo solo las caderas (no los hombros): [employer] el peso hacia la pierna izquierda, MANTENER/SENTIR. Vuelva a colocar el mismo peso en cada pierna, MANTENER/SENTIR. Mueva el peso sobre su pierna derecha, MANTENER/SENTIR. Vuelva a colocar el mismo peso en cada pierna, MANTENER/SENTIR. Repetir. Comience con ambas manos en el lavabo, avance solo hacia la derecha y luego sin manos. 2. Cambio de adelante hacia atrs: moviendo solo las caderas (no los hombros): [employer] el peso hacia adelante [employer] dedos de los pies, MANTENER/SENTIR. Mueva su peso hacia atrs para igualar el pie plano en ambas piernas, MANTENER/SENTIR. Mueva su peso nuevamente sobre sus talones, MANTENER/SENTIR. Mueva su peso hacia atrs para igualarlo en ambas piernas, MANTENER/SENTIR. Repetir. comience con ambas manos en el fregadero, avance solo [employer] mano derecha, luego sin [place]. 3. Conos/copas en movimiento: Con el mismo peso en cada pierna: Sujtese con una mano la primera vez, luego avance hasta no apoyar las manos. Mueva las tazas de un lado del fregadero al otro. Coloque los vasos a ~2" fuera de su alcance, avance hasta 10" ms all de su alcance. Coloque una mano en el medio del fregadero y extienda la mano con la otra. Haz ambos brazos. Luego coloque una mano y mueva las tazas con la otra. 4. Alcanzar hacia arriba/hacia Cottie Diss: se alterna el alcance hasta los gabinetes superiores o el techo si no hay gabinetes presentes. Mantenga el mismo peso en  cada pierna. Comience con Natha Bair apoyada en el mostrador mientras la otra mano se extiende y avance hasta no apoyar la mano al [place]ista. Coloque una mano en el medio del fregadero y extindala con la [place]. Haz ambos brazos. Luego coloque una mano y mueva las tazas con la otra. 5. Mirando por encima de los hombros: Con el mismo peso en cada pierna: gire alternativamente para mirar por encima de los hombros con una mano apoyada en el mostrador segn sea necesario. Comience con movimientos de la [place] solo para mirar delante del hombro, luego incluso  con el hombro y [address] mirar detrs de usted. Para mirar hacia un lado, mueva la cabeza/los ojos, luego el hombro del costado que mira [employer], cambie ms peso hacia el costado y tire de la cadera hacia atrs. Coloca una mano en el medio del fregadero y suelta la otra para que tu hombro pueda retroceder. Cambia de mano para mirar hacia otro lado. Luego coloque una mano y mueva las tazas con la otra. 6. Al pisar con una pierna que no est amputada: retire de su camino los artculos que se encuentran debajo del gabinete. Mueva las caderas/pelvis para que el peso recaiga sobre la prtesis. PASE LENTAMENTE la otra pierna de modo que la parte delantera del pie quede dentro del gabinete. Luego retroceda al piso.     Walking Program Using RW  (Por favor use su andador.): [employer] caminar durante un cierto perodo de tiempo cada da                          Camine 10 minutos 1 vez al da.             Aumentar 2 minutos cada 7 das              Trabaje hasta 20 minutos seguidos (1 vez al da).               Ejemplo:                         Da 1-2           4-5 minutos     3 veces al da                         Da 7-8           10-12 minutos 2-3 veces al da                         Da 13-14       20-22 minutos 1-2 veces al da     ASSESSMENT:  CLINICAL IMPRESSION: Patient is a 68 y.o. male who was seen today for physical therapy evaluation to  improve safety with new microprocessor knee for L AKA. Patient ambulates into session with axillary crutch and demonstrates excessive step length of LLE to ensure knee is locked prior to IC as pt is fearful of knee buckling on him. Pt has had >10 falls recently, placing him at a high fall risk and has not had knee assessed by prosthetist to ensure it is working properly. Informed pt to make appointment with [facility] prior to scheduling PT, as pt's self selected gait speed is greatly improved from previous assessment and pt's falls seems to be related to malfunction of knee. Patient will benefit from skilled physical therapy services to address impairments and progress in safety towards LRAD to progress towards personal LTG.   OBJECTIVE IMPAIRMENTS: Abnormal gait, decreased activity tolerance, decreased balance, decreased coordination, decreased knowledge of use of DME, difficulty walking, improper body mechanics, and prosthetic dependency .   ACTIVITY LIMITATIONS: carrying, lifting, bending, standing, squatting, stairs, transfers, and locomotion level  PARTICIPATION LIMITATIONS: meal prep, cleaning, community activity, occupation, and yard work  PERSONAL FACTORS: Fitness, Past/current experiences, and 3+ comorbidities: see PMH  are also affecting patient's functional outcome.   REHAB POTENTIAL: Good  CLINICAL DECISION MAKING: Evolving/moderate complexity  EVALUATION COMPLEXITY: Moderate   GOALS: Goals reviewed with patient? Yes  STG = LTG due to POC Length    LONG TERM GOALS:  Target date: 08/07/2023  Pt will improve 5 x STS to less than or equal to 15 seconds w/proper body mechanics to demonstrate improved functional strength and transfer efficiency.   Baseline: 16.03s w/LLE in kickstand, retropulsion and poor eccentric control  Goal status: INITIAL  2.  Pt will report no falls w/microprocessor knee since eval for improved independence and reduced fall risk  Baseline: >10 falls  Goal  status: INITIAL  3.  Pt will improve gait velocity to at least 3.0 ft/s w/LRAD mod I for improved gait efficiency and functional use of LLE prosthetic    Baseline: 2.87 ft/s w/axillary crutch  Goal status: INITIAL    PLAN:  PT FREQUENCY: 1-2x/week  PT DURATION: 4 weeks with POC written out longer as patient needs to see [facility] prior to scheduling visits   PLANNED INTERVENTIONS: 97164- PT Re-evaluation, 97750- Physical Performance Testing, 97110-Therapeutic exercises, 97530- Therapeutic activity, [id number]- Neuromuscular re-education, 97535- Self Care, [id number]- Gait training, [phone]- Orthotic/Prosthetic subsequent, Patient/Family education, Balance training, Stair training, Dry Needling, DME instructions, and Gait training  PLAN FOR NEXT SESSION: Monitor BP. Review/update HEP prn. Did they see [facility]? Work on reduced step length w/LLE for proper biomechanics of L knee (pt locks it out due to fear of buckling). Retro gait, gait w/cane    Tim Vincent E Alysiana Ethridge, PT, DPT 06/26/2023, 3:21 PM

## 2023-07-02 ENCOUNTER — Encounter: Payer: Self-pay | Admitting: Podiatry

## 2023-07-16 ENCOUNTER — Ambulatory Visit: Attending: Orthopedic Surgery | Admitting: Physical Therapy

## 2023-07-16 VITALS — BP 151/74 | HR 73

## 2023-07-16 DIAGNOSIS — R2689 Other abnormalities of gait and mobility: Secondary | ICD-10-CM

## 2023-07-16 DIAGNOSIS — Z9181 History of falling: Secondary | ICD-10-CM | POA: Diagnosis not present

## 2023-07-16 DIAGNOSIS — M6281 Muscle weakness (generalized): Secondary | ICD-10-CM | POA: Diagnosis not present

## 2023-07-16 DIAGNOSIS — R2681 Unsteadiness on feet: Secondary | ICD-10-CM | POA: Diagnosis not present

## 2023-07-16 NOTE — Therapy (Signed)
 OUTPATIENT PHYSICAL THERAPY PROSTHETIC TREATMENT    Patient Name: Zaine Zuhlke MRN: [id number] DOB:Oct 06, 1955, 68 y.o., male Today's Date: 07/16/2023  PCP: Nita Bast, NP  REFERRING PROVIDER: Gearldean Keepers, MD  END OF SESSION:  PT End of Session - 07/16/23 1534     Visit Number 2    Number of Visits 9    Date for PT Re-Evaluation 08/07/23   Due to delay in scheduling   Authorization Type [employer] Dual Complete    PT Start Time 1531    PT Stop Time 1610    PT Time Calculation (min) 39 min    Equipment Utilized During Treatment Gait belt   L AKA w/microprocessor knee   Activity Tolerance Patient tolerated treatment well    Behavior During Therapy WFL for tasks assessed/performed             Past Medical History:  Diagnosis Date   Cancer (HCC)    prostate ca   Constipation    Diabetes mellitus without complication (HCC)    type 2   Gout    HLD (hyperlipidemia)    Hypertension    Past Surgical History:  Procedure Laterality Date   AMPUTATION Left 04/17/2022   Procedure: LEFT ABOVE KNEE AMPUTATION;  Surgeon: Timothy Ford, MD;  Location: [facility];  Service: Orthopedics;  Laterality: Left;   MASS EXCISION Left 04/17/2021   Procedure: EXCISION LEFT ELBOW MASS;  Surgeon: Barb Bonito, MD;  Location: [facility];  Service: Plastics;  Laterality: Left;  1 hour   MASS EXCISION Left 08/10/2021   Procedure: EXCISION ELBOW MASS;  Surgeon: Barb Bonito, MD;  Location: [facility];  Service: Plastics;  Laterality: Left;  1 hour   MASS EXCISION Right 10/16/2022   Procedure: EXCISION OF MASS RIGHT ELBOW AND ARM;  Surgeon: Teretha Ferguson, MD;  Location: [facility];  Service: Plastics;  Laterality: Right;  1.5 hours   Patient Active Problem List   Diagnosis Date Noted   Diabetic infection of left foot (HCC) 04/17/2022   Necrotizing fasciitis of lower leg (HCC) 04/17/2022   Dyslipidemia 04/17/2022   Essential hypertension 01/12/2021   Obesity  01/12/2021   Skin sensation disturbance 01/12/2021   Type 2 diabetes mellitus without complication (HCC) 01/12/2021   Diabetic neuropathy (HCC) 01/12/2021   Hav (hallux abducto valgus), unspecified laterality 01/12/2021   Cough 10/31/2017   Elevated blood pressure reading 10/31/2017    ONSET DATE: 03/20/2023 MD referral to PT  REFERRING DIAG: W09.811 (ICD-10-CM) - Left above-knee amputee   THERAPY DIAG:  Other abnormalities of gait and mobility  Muscle weakness (generalized)  Unsteadiness on feet  History of falling  Rationale for Evaluation and Treatment: Rehabilitation  SUBJECTIVE:   SUBJECTIVE STATEMENT: Pt presents w/SPC. Reports he saw Delaney at [facility] prior to session and she lengthened the leg and calibrated the knee. Pt reports he has had one fall since he was last here, was reaching for something and fell forward. No pain today.    Pt accompanied by: family member daughter interprets for session and is [facility] certified to do so, patient denies another interpreter   PERTINENT HISTORY: DM, neuropathy, HTN, left AKA 04/17/22, left elbow mass excision 04/17/21, 08/10/21 & 10/16/22, prostate CA, gout  PAIN:  Are you having pain? No  PRECAUTIONS: None  WEIGHT BEARING RESTRICTIONS: No  FALLS: Has patient fallen in last 6 months? Yes. 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  He lifts up to 40-50#.   PLOF: Independent, Independent with household mobility without device, and Independent with community mobility without device  PATIENT GOALS:  "walk without any support"   OBJECTIVE:   COGNITION: Overall cognitive status:  Within functional limits for tasks assessed  POSTURE: rounded shoulders, forward head, and flexed trunk   BED MOBILITY:  Independent all aspects and no changes reported   TRANSFERS: Sit to stand: Modified independence with armrests and able to stabilize Stand to sit: Modified independence using armrests to control descent.    VITALS: Vitals:   07/16/23 1536  BP: (!) 151/74  Pulse: 73        GAIT: Gait pattern: step through pattern, decreased step length- Right, decreased stance time- Left, Left hip hike, lateral hip instability, lateral lean- Right, and trunk flexed; Excessive hip flexion of LLE to ensure L knee is locked out prior to IC as pt is fearful of knee buckling  Distance walked: Various clinic distances  Assistive device utilized: Environmental consultant - 2 wheeled and Transfemoral Prosthesis Level of assistance: Modified independence Gait velocity:  comfortable self-selected pace 2.87 ft/s, which is improved from previous assessment of 2.36 ft/sec taken 03/2023  CURRENT PROSTHETIC WEAR ASSESSMENT: Patient is independent with: skin check, residual limb care, prosthetic cleaning, ply sock cleaning, and correct ply sock adjustment Donning prosthesis: Modified independence Doffing prosthesis: Modified independence Prosthetic wear tolerance: pt reports daily for most of awake hours every day Prosthetic weight bearing tolerance: No reported changes Edema: To be assessed  Residual limb condition: not assessed  Prosthetic description: Ischial Containment Socket with flexible inner socket, silicon liner with suction ring suspension, microprocessor knee K code/activity level with prosthetic use: Level 3   TREATMENT:  Self-care/home management  Assessed vitals (see above) and WNL  NMR  Mass practice of sit to stands from mat table to work on proper knee mechanics on LLE, weight shift to LLE and functional BLE strength:  Performed x8 reps without UE support and noted pt placing RLE  posterior to L and significant lateral lean to R, essentially performing a modified pistol squat on RLE. Provided max multimodal cues to shift to L, but pt not receptive  Added 2" step under RLE to facilitate shift to LLE, x10 reps, but pt continued to shift to R and not place weight on LLE.  Added mirror for visual biofeedback on body position x5 reps, but pt would not use Lastly, provided max tactile cues at pelvis to reduce R lateral lean and pt very challenged by this. Pt reports he can now tell the difference between placing weight on RLE vs LLE  Staggered stance sit to stands (LLE posterior) x5 reps and pt very challenged by this. Added to HEP (see bolded below)  In // bars for improved facilitation of L knee flexion, step clearance and functional use of LLE: Step up to 4" box leading w/RLE followed by step down leading w/RLE, x20 reps. Pt initially circum ducting LLE to step up to box and compensating w/BUE support to avoid knee flexion of LLE when stepping off box. Max multimodal cues provided for proper knee biomechanics and pt able to facilitate minor knee flexion w/step up, but still unable to  flex knee w/step down. Noted pt placing L foot on edge of box w/step down, so unable to facilitate knee flexion well. Cued pt to leave foot entirely on box and pt then able to facilitate some knee flexion, but uncontrolled  Adv/retreat over 4" foam beam w/RLE only to facilitate increased step length w/RLE, facilitation of TS on LLE and weightbearing tolerance on LLE. Pt performed well w/BUE support and tested recall later in session  STAIRS:  Level of Assistance: CGA  Stair Negotiation Technique: Step to Pattern Alternating Pattern  with Bilateral Rails  Number of Stairs: 8   Height of Stairs: 4"  Comments: Pt navigated 4 steps w/step-to pattern w/ascent and descent initially and noted pt circum ducting LLE despite cues to flex knee. On second rep, cued pt to perform step-to to ascend and attempt  reciprocal pattern to descend, but pt unable to do so due to fear of L knee flexion.   Gait pattern: step through pattern, decreased step length- Right, decreased stance time- Left, decreased stride length, decreased hip/knee flexion- Left, lateral hip instability, lateral lean- Right, and wide BOS Distance walked: 115' plus various clinic distances  Assistive device utilized: Single point cane Level of assistance: SBA Comments: Pt initially exhibiting short step length w/RLE, resulting in lack of TS position on LLE and difficulty facilitating knee flexion w/swing. At end of session, cued pt for large steps w/RLE which pt able to demonstrate well and noted improved knee flexion of LLE as well as comfort w/gait.      PATIENT EDUCATION: Education details: Additions to HEP, work on increased step length w/RLE  Person educated: Patient and Psychologist, sport and exercise) Education method: Programmer, multimedia, demonstration, verbal cues and handouts  Education comprehension: verbalized understanding, returned demonstration, verbal cues required, needs further education      HOME EXERCISE PROGRAM: Access Code: [email or web address] URL: [email or web address] Date: 07/16/2023 Prepared by: Burleigh Carp Jasminemarie Sherrard  Exercises - Staggered Sit-to-Stand  - 1 x daily - 7 x weekly - 3 sets - 10 reps   AMPUTEE SINK HEP (Provided to pt and daughter in [place] and spanish 5/16) Haz cada ejercicio 1-2 veces al da. Haz cada ejercicio 10 repeticiones. Mantenga cada ejercicio durante 2 segundos para sentir su ubicacin.   EN EL FREGADERO ENCUENTRE SU POSICIN EN LA LNEA MEDIA Y COLOQUE LOS PIES A LA IGUAL DISTANCIA DE LA LNEA MEDIA. Trate de encontrar esta posicin cuando est quieto para Arts development officer.   UTILICE CINTA EN EL PISO PARA MARCAR LA POSICIN DE LA LNEA MEDIA. Tambin debe intentar sentir con la extremidad la presin en la cavidad. Ests tratando de sentir con las extremidades lo que solas sentir con la planta del  pie.   1. Desplazamiento de lado a lado: moviendo solo las caderas (no los hombros): [employer] el peso hacia la pierna izquierda, MANTENER/SENTIR. Vuelva a colocar el mismo peso en cada pierna, MANTENER/SENTIR. Mueva el peso sobre su pierna derecha, MANTENER/SENTIR. Vuelva a colocar el mismo peso en cada pierna, MANTENER/SENTIR. Repetir. Comience con ambas manos en el lavabo, avance solo hacia la derecha y luego sin manos. 2. Cambio de adelante hacia atrs: moviendo solo las caderas (no los hombros): [employer] el peso hacia adelante [employer] dedos de los pies, MANTENER/SENTIR. Mueva su peso hacia atrs para igualar el pie plano en ambas piernas, MANTENER/SENTIR. Mueva su peso nuevamente sobre sus talones, MANTENER/SENTIR. Mueva su peso hacia atrs para igualarlo en ambas piernas, MANTENER/SENTIR. Repetir. comience con ambas manos en el fregadero, avance solo [employer] mano [place], luego  sin manos. 3. Conos/copas en movimiento: Con el mismo peso en cada pierna: Sujtese con una mano la primera vez, luego avance hasta no apoyar las manos. Mueva las tazas de un lado del fregadero al otro. Coloque los vasos a ~2" fuera de su alcance, avance hasta 10" ms all de su alcance. Coloque una mano en el medio del fregadero y extienda la mano con la otra. Haz ambos brazos. Luego coloque una mano y mueva las tazas con la otra. 4. Alcanzar hacia arriba/hacia Cottie Diss: se alterna el alcance hasta los gabinetes superiores o el techo si no hay gabinetes presentes. Mantenga el mismo peso en cada pierna. Comience con Natha Bair apoyada en el mostrador mientras la otra mano se extiende y avance hasta no apoyar la mano al Barista. Coloque una mano en el medio del fregadero y extindala con la [place]. Haz ambos brazos. Luego coloque una mano y mueva las tazas con la otra. 5. Mirando por encima de los hombros: Con el mismo peso en cada pierna: gire alternativamente para mirar por encima de los hombros con una mano apoyada en el mostrador  segn sea necesario. Comience con movimientos de la [place] solo para mirar delante del hombro, luego incluso con el hombro y avance hasta mirar detrs de usted. Para mirar hacia un lado, mueva la cabeza/los ojos, luego el hombro del costado que mira [place], cambie ms peso hacia el costado y tire de la cadera hacia atrs. Coloca una mano en el medio del fregadero y suelta la otra para que tu hombro pueda retroceder. Cambia de mano para mirar hacia otro lado. Luego coloque una mano y mueva las tazas con la otra. 6. Al pisar con una pierna que no est amputada: retire de su camino los artculos que se encuentran debajo del gabinete. Mueva las caderas/pelvis para que el peso recaiga sobre la prtesis. PASE LENTAMENTE la otra pierna de modo que la parte delantera del pie quede dentro del gabinete. Luego retroceda al piso.     Walking Program Using RW  (Por favor use su andador.): [employer] caminar durante un cierto perodo de tiempo cada da                          Camine 10 minutos 1 vez al da.             Aumentar 2 minutos cada 7 das              Trabaje hasta 20 minutos seguidos (1 vez al da).               Ejemplo:                         Da 1-2           4-5 minutos     3 veces al da                         Da 7-8           10-12 minutos 2-3 veces al da                         Da 13-14       20-22 minutos 1-2 veces al da     ASSESSMENT:  CLINICAL IMPRESSION: Emphasis of skilled PT session on facilitation of L knee flexion, weightbearing tolerance  on LLE and increased step length of RLE. Pt Demonstrates excessive weight shift to R side w/transfers and gait, resulting in poor facilitation of L knee flexion and stability in stance on LLE. Pt requires max multimodal cues to facilitate weight shift to LLE during transfers and for increased step length w/RLE to achieve TS/pre-swing on LLE. Continue POC.   OBJECTIVE IMPAIRMENTS: Abnormal gait, decreased activity tolerance, decreased  balance, decreased coordination, decreased knowledge of use of DME, difficulty walking, improper body mechanics, and prosthetic dependency .   ACTIVITY LIMITATIONS: carrying, lifting, bending, standing, squatting, stairs, transfers, and locomotion level  PARTICIPATION LIMITATIONS: meal prep, cleaning, community activity, occupation, and yard work  PERSONAL FACTORS: Fitness, Past/current experiences, and 3+ comorbidities: see PMH are also affecting patient's functional outcome.   REHAB POTENTIAL: Good  CLINICAL DECISION MAKING: Evolving/moderate complexity  EVALUATION COMPLEXITY: Moderate   GOALS: Goals reviewed with patient? Yes  STG = LTG due to POC Length    LONG TERM GOALS:  Target date: 08/07/2023  Pt will improve 5 x STS to less than or equal to 15 seconds w/proper body mechanics to demonstrate improved functional strength and transfer efficiency.   Baseline: 16.03s w/LLE in kickstand, retropulsion and poor eccentric control  Goal status: INITIAL  2.  Pt will report no falls w/microprocessor knee since eval for improved independence and reduced fall risk  Baseline: >10 falls  Goal status: INITIAL  3.  Pt will improve gait velocity to at least 3.0 ft/s w/LRAD mod I for improved gait efficiency and functional use of LLE prosthetic    Baseline: 2.87 ft/s w/axillary crutch  Goal status: INITIAL    PLAN:  PT FREQUENCY: 1-2x/week  PT DURATION: 4 weeks with POC written out longer as patient needs to see [facility] prior to scheduling visits   PLANNED INTERVENTIONS: 97164- PT Re-evaluation, 97750- Physical Performance Testing, 97110-Therapeutic exercises, 97530- Therapeutic activity, [id number]- Neuromuscular re-education, 97535- Self Care, 42706- Gait training, [phone]- Orthotic/Prosthetic subsequent, Patient/Family education, Balance training, Stair training, Dry Needling, DME instructions, and Gait training  PLAN FOR NEXT SESSION: Monitor BP. Review/update HEP prn. Facilitation of  L knee flexion in weightbearing (ramps/curbs), sit to stands   Naleah Kofoed E Nohelani Benning, PT, DPT 07/16/2023, 4:15 PM

## 2023-07-18 ENCOUNTER — Encounter: Payer: Self-pay | Admitting: Physical Therapy

## 2023-07-18 ENCOUNTER — Ambulatory Visit: Admitting: Physical Therapy

## 2023-07-18 VITALS — BP 175/80 | HR 80

## 2023-07-18 DIAGNOSIS — R2681 Unsteadiness on feet: Secondary | ICD-10-CM

## 2023-07-18 DIAGNOSIS — M6281 Muscle weakness (generalized): Secondary | ICD-10-CM | POA: Diagnosis not present

## 2023-07-18 DIAGNOSIS — Z9181 History of falling: Secondary | ICD-10-CM | POA: Diagnosis not present

## 2023-07-18 DIAGNOSIS — R2689 Other abnormalities of gait and mobility: Secondary | ICD-10-CM | POA: Diagnosis not present

## 2023-07-18 NOTE — Therapy (Signed)
 OUTPATIENT PHYSICAL THERAPY PROSTHETIC TREATMENT    Patient Name: Macklin Einstein MRN: [id number] DOB:08/08/1955, 68 y.o., male Today's Date: 07/18/2023  PCP: Nita Bast, NP  REFERRING PROVIDER: Gearldean Keepers, MD  END OF SESSION:  PT End of Session - 07/18/23 1332     Visit Number 3    Number of Visits 9    Date for PT Re-Evaluation 08/07/23   Due to delay in scheduling   Authorization Type [employer] Dual Complete    PT Start Time 1327    PT Stop Time 1412    PT Time Calculation (min) 45 min    Equipment Utilized During Treatment Gait belt   L AKA w/microprocessor knee   Activity Tolerance Patient tolerated treatment well    Behavior During Therapy WFL for tasks assessed/performed             Past Medical History:  Diagnosis Date   Cancer (HCC)    prostate ca   Constipation    Diabetes mellitus without complication (HCC)    type 2   Gout    HLD (hyperlipidemia)    Hypertension    Past Surgical History:  Procedure Laterality Date   AMPUTATION Left 04/17/2022   Procedure: LEFT ABOVE KNEE AMPUTATION;  Surgeon: Timothy Ford, MD;  Location: [facility];  Service: Orthopedics;  Laterality: Left;   MASS EXCISION Left 04/17/2021   Procedure: EXCISION LEFT ELBOW MASS;  Surgeon: Barb Bonito, MD;  Location: [facility];  Service: Plastics;  Laterality: Left;  1 hour   MASS EXCISION Left 08/10/2021   Procedure: EXCISION ELBOW MASS;  Surgeon: Barb Bonito, MD;  Location: [facility];  Service: Plastics;  Laterality: Left;  1 hour   MASS EXCISION Right 10/16/2022   Procedure: EXCISION OF MASS RIGHT ELBOW AND ARM;  Surgeon: Teretha Ferguson, MD;  Location: [facility];  Service: Plastics;  Laterality: Right;  1.5 hours   Patient Active Problem List   Diagnosis Date Noted   Diabetic infection of left foot (HCC) 04/17/2022   Necrotizing fasciitis of lower leg (HCC) 04/17/2022   Dyslipidemia 04/17/2022   Essential hypertension 01/12/2021   Obesity  01/12/2021   Skin sensation disturbance 01/12/2021   Type 2 diabetes mellitus without complication (HCC) 01/12/2021   Diabetic neuropathy (HCC) 01/12/2021   Hav (hallux abducto valgus), unspecified laterality 01/12/2021   Cough 10/31/2017   Elevated blood pressure reading 10/31/2017    ONSET DATE: 03/20/2023 MD referral to PT  REFERRING DIAG: W29.562 (ICD-10-CM) - Left above-knee amputee   THERAPY DIAG:  Other abnormalities of gait and mobility  Muscle weakness (generalized)  Unsteadiness on feet  History of falling  Rationale for Evaluation and Treatment: Rehabilitation  SUBJECTIVE:   SUBJECTIVE STATEMENT: Pt presents w/SPC late to session. Reports he feels some improvement since changes made by Delaney at [facility] (lengthened the leg and calibrated the knee), but he still has some hesitancy with the leg feeling he could fall. Pt reports he has had no falls since he was last here. No pain today.    Pt accompanied by: family member daughter interprets for session and is [facility] certified to do so, patient denies another interpreter   PERTINENT HISTORY: DM, neuropathy, HTN, left AKA 04/17/22, left elbow mass excision 04/17/21, 08/10/21 & 10/16/22, prostate CA, gout  PAIN:  Are you having pain? No  PRECAUTIONS: None  WEIGHT BEARING RESTRICTIONS: No  FALLS: Has patient fallen in last 6 months? Yes. 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  He lifts up to 40-50#.   PLOF: Independent, Independent with household mobility without device, and Independent with community mobility without device  PATIENT GOALS:  "walk without any support"    OBJECTIVE:   COGNITION: Overall cognitive status: Within functional limits for tasks assessed  POSTURE: rounded shoulders, forward head, and flexed trunk   BED MOBILITY:  Independent all aspects and no changes reported   TRANSFERS: Sit to stand: Modified independence with armrests and able to stabilize Stand to sit: Modified independence using armrests to control descent.    VITALS: Vitals:   07/18/23 1327  BP: (!) 175/80  Pulse: 80        GAIT: Gait pattern: step through pattern, decreased step length- Right, decreased stance time- Left, Left hip hike, lateral hip instability, lateral lean- Right, and trunk flexed; Excessive hip flexion of LLE to ensure L knee is locked out prior to IC as pt is fearful of knee buckling  Distance walked: Various clinic distances  Assistive device utilized: Environmental consultant - 2 wheeled and Transfemoral Prosthesis Level of assistance: Modified independence Gait velocity:  comfortable self-selected pace 2.87 ft/s, which is improved from previous assessment of 2.36 ft/sec taken 03/2023  CURRENT PROSTHETIC WEAR ASSESSMENT: Patient is independent with: skin check, residual limb care, prosthetic cleaning, ply sock cleaning, and correct ply sock adjustment Donning prosthesis: Modified independence Doffing prosthesis: Modified independence Prosthetic wear tolerance: pt reports daily for most of awake hours every day Prosthetic weight bearing tolerance: No reported changes Edema: To be assessed  Residual limb condition: not assessed  Prosthetic description: Ischial Containment Socket with flexible inner socket, silicon liner with suction ring suspension, microprocessor knee K code/activity level with prosthetic use: Level 3   TREATMENT:  Self-care/home management  Assessed vitals (see above) and WNL  NMR  Up and over 4" ramp to platform and down w/ BUE support practicing riding knee w/ some improvement of prosthetic knee flexion w/  repetition.  STAIRS:  Level of Assistance: CGA  Stair Negotiation Technique: Step to Pattern Alternating Pattern  with Bilateral Rails  Number of Stairs: 8   Height of Stairs: 4"  Comments: Repeated twice w/ pt having severe LOB in LLE stance on ascent resulting in total UE support to return to standing.  No stumble recovery noted.  Pt has preference to maintain prosthesis in full extension as he states this is what happens when he falls.  RAMP:  Level of Assistance: CGA Assistive device utilized: Single point cane Ramp Comments: Pt hesitant to allow LLE to flex especially on descent relying on increased forward lean due to sense of instability.  Attempted twice w/ mild forward LOB due to trunk posture.  Self-Care: PT called [employer] in regards to lack of noted eccentric control in MPK component.  Delaney feeling it appropriate to set up appt to replace component with loaner in order for component company rep to assist in trouble shooting based on previous issues.  Pt does not have access to app for this type of MPK, but she will look at stumble recovery setup in app to ensure it is engaged properly.  Delaney to text daughter to set up appt -  daughter will reach out if not heard from her by Monday.    Had pt practice "falling" backwards into supported chair in symmetrical stance x3 (PT facilitates last fall to place full body weight on flexed knee) to assess eccentric control - no control noted. Confirmed pt and daughter are aware of charging needs and how to do this - they verbalize charging schedule at home and it appears to be charging according to daughter.  When prosthetic dropped into swing there is no resistance noted.  Instructed pt on safety mechanics to limit fall risk at home, continue using cane.      PATIENT EDUCATION: Education details: Appt w/ Bernerd Bright, MPK goals, concerns with current componentry.  Safety w/ current prosthesis. Person educated: Patient and Child(ren) Education  method: Explanation, demonstration, verbal cues and handouts  Education comprehension: verbalized understanding, returned demonstration, verbal cues required, needs further education      HOME EXERCISE PROGRAM: Access Code: [email or web address] URL: [email or web address] Date: 07/16/2023 Prepared by: Burleigh Carp Plaster  Exercises - Staggered Sit-to-Stand  - 1 x daily - 7 x weekly - 3 sets - 10 reps   AMPUTEE SINK HEP (Provided to pt and daughter in [place] and spanish 5/16) Haz cada ejercicio 1-2 veces al da. Haz cada ejercicio 10 repeticiones. Mantenga cada ejercicio durante 2 segundos para sentir su ubicacin.   EN EL FREGADERO ENCUENTRE SU POSICIN EN LA LNEA MEDIA Y COLOQUE LOS PIES A LA IGUAL DISTANCIA DE LA LNEA MEDIA. Trate de encontrar esta posicin cuando est quieto para Arts development officer.   UTILICE CINTA EN EL PISO PARA MARCAR LA POSICIN DE LA LNEA MEDIA. Tambin debe intentar sentir con la extremidad la presin en la cavidad. Ests tratando de sentir con las extremidades lo que solas sentir con la planta del pie.   1. Desplazamiento de lado a lado: moviendo solo las caderas (no los hombros): [employer] el peso hacia la pierna izquierda, MANTENER/SENTIR. Vuelva a colocar el mismo peso en cada pierna, MANTENER/SENTIR. Mueva el peso sobre su pierna derecha, MANTENER/SENTIR. Vuelva a colocar el mismo peso en cada pierna, MANTENER/SENTIR. Repetir. Comience con ambas manos en el lavabo, avance solo hacia la derecha y luego sin manos. 2. Cambio de adelante hacia atrs: moviendo solo las caderas (no los hombros): [employer] el peso hacia adelante [employer] dedos de los pies, MANTENER/SENTIR. Mueva su peso hacia atrs para igualar el pie plano en ambas piernas, MANTENER/SENTIR. Mueva su peso nuevamente sobre sus talones, MANTENER/SENTIR. Mueva su peso hacia atrs para igualarlo en ambas piernas, MANTENER/SENTIR. Repetir. comience con ambas manos en el fregadero, avance solo [employer] mano  derecha, luego sin [place]. 3. Conos/copas en movimiento: Con el mismo peso en cada pierna: Sujtese con una mano la primera vez, luego avance hasta no apoyar las manos. Mueva las tazas de un lado del fregadero al otro. Coloque los vasos a ~2" fuera de su alcance, avance hasta 10" ms all de su alcance. Coloque una mano en el medio del fregadero y extienda la mano con la otra. Haz ambos brazos. Luego coloque una mano y mueva las tazas con la otra. 4. Alcanzar hacia arriba/hacia Cottie Diss: se alterna el alcance hasta los gabinetes superiores o el techo si no hay gabinetes presentes. Mantenga el mismo peso en cada pierna. Comience con Natha Bair apoyada en el mostrador mientras la otra mano se extiende y avance hasta no apoyar la mano al Barista. Coloque una mano en el medio del fregadero y extindala con la [place]. Haz ambos brazos. Luego  coloque una mano y mueva las tazas con la otra. 5. Mirando por encima de los hombros: Con el mismo peso en cada pierna: gire alternativamente para mirar por encima de los hombros con una mano apoyada en el mostrador segn sea necesario. Comience con movimientos de la [place] solo para mirar delante del hombro, luego incluso con el hombro y avance hasta mirar detrs de usted. Para mirar hacia un lado, mueva la cabeza/los ojos, luego el hombro del costado que mira [place], cambie ms peso hacia el costado y tire de la cadera hacia atrs. Coloca una mano en el medio del fregadero y suelta la otra para que tu hombro pueda retroceder. Cambia de mano para mirar hacia otro lado. Luego coloque una mano y mueva las tazas con la otra. 6. Al pisar con una pierna que no est amputada: retire de su camino los artculos que se encuentran debajo del gabinete. Mueva las caderas/pelvis para que el peso recaiga sobre la prtesis. PASE LENTAMENTE la otra pierna de modo que la parte delantera del pie quede dentro del gabinete. Luego retroceda al piso.     Walking Program Using RW  (Por favor  use su andador.): [employer] caminar durante un cierto perodo de tiempo cada da                          Camine 10 minutos 1 vez al da.             Aumentar 2 minutos cada 7 das              Trabaje hasta 20 minutos seguidos (1 vez al da).               Ejemplo:                         Da 1-2           4-5 minutos     3 veces al da                         Da 7-8           10-12 minutos 2-3 veces al da                         Da 13-14       20-22 minutos 1-2 veces al da     ASSESSMENT:  CLINICAL IMPRESSION: Pt presents for follow-up treatment regarding use of MPK component.  MPK today did not demo stumble recovery during 2 instances on ramp and stairs.  PT unable to note eccentric control limiting reciprocal stair mobility and "riding" componentry appropriately.  PT contacted prosthetist regarding concerns and plan to place loaner component as there has been a change in function with MPK between time pt received loaner and then his purchased component.  He was instructed to continue with cane and current mechanics until this component is further addressed.  Continue per POC.   OBJECTIVE IMPAIRMENTS: Abnormal gait, decreased activity tolerance, decreased balance, decreased coordination, decreased knowledge of use of DME, difficulty walking, improper body mechanics, and prosthetic dependency .   ACTIVITY LIMITATIONS: carrying, lifting, bending, standing, squatting, stairs, transfers, and locomotion level  PARTICIPATION LIMITATIONS: meal prep, cleaning, community activity, occupation, and yard work  PERSONAL FACTORS: Fitness, Past/current experiences, and 3+ comorbidities: see PMH are also affecting patient's functional outcome.  REHAB POTENTIAL: Good  CLINICAL DECISION MAKING: Evolving/moderate complexity  EVALUATION COMPLEXITY: Moderate   GOALS: Goals reviewed with patient? Yes  STG = LTG due to POC Length    LONG TERM GOALS:  Target date: 08/07/2023  Pt will improve 5  x STS to less than or equal to 15 seconds w/proper body mechanics to demonstrate improved functional strength and transfer efficiency.   Baseline: 16.03s w/LLE in kickstand, retropulsion and poor eccentric control  Goal status: INITIAL  2.  Pt will report no falls w/microprocessor knee since eval for improved independence and reduced fall risk  Baseline: >10 falls  Goal status: INITIAL  3.  Pt will improve gait velocity to at least 3.0 ft/s w/LRAD mod I for improved gait efficiency and functional use of LLE prosthetic    Baseline: 2.87 ft/s w/axillary crutch  Goal status: INITIAL    PLAN:  PT FREQUENCY: 1-2x/week  PT DURATION: 4 weeks with POC written out longer as patient needs to see [employer] prior to scheduling visits   PLANNED INTERVENTIONS: 97164- PT Re-evaluation, 97750- Physical Performance Testing, 97110-Therapeutic exercises, 97530- Therapeutic activity, [id number]- Neuromuscular re-education, 97535- Self Care, 16109- Gait training, [phone]- Orthotic/Prosthetic subsequent, Patient/Family education, Balance training, Stair training, Dry Needling, DME instructions, and Gait training  PLAN FOR NEXT SESSION: Monitor BP. Review/update HEP prn. Facilitation of L knee flexion in weightbearing (ramps/curbs), sit to stands; if component not adjusted to loaner component maybe focus on bilateral standing balance/core stability - [facility] appt?  Earlean Glaze, PT, DPT 07/18/2023, 2:36 PM

## 2023-07-21 DIAGNOSIS — R229 Localized swelling, mass and lump, unspecified: Secondary | ICD-10-CM | POA: Diagnosis not present

## 2023-07-21 DIAGNOSIS — I10 Essential (primary) hypertension: Secondary | ICD-10-CM | POA: Diagnosis not present

## 2023-07-22 ENCOUNTER — Encounter: Admitting: Physical Therapy

## 2023-07-24 ENCOUNTER — Encounter: Payer: Self-pay | Admitting: Physical Therapy

## 2023-07-24 ENCOUNTER — Ambulatory Visit: Admitting: Physical Therapy

## 2023-07-24 VITALS — BP 144/70 | HR 73

## 2023-07-24 DIAGNOSIS — Z9181 History of falling: Secondary | ICD-10-CM | POA: Diagnosis not present

## 2023-07-24 DIAGNOSIS — R2689 Other abnormalities of gait and mobility: Secondary | ICD-10-CM | POA: Diagnosis not present

## 2023-07-24 DIAGNOSIS — R2681 Unsteadiness on feet: Secondary | ICD-10-CM | POA: Diagnosis not present

## 2023-07-24 DIAGNOSIS — M6281 Muscle weakness (generalized): Secondary | ICD-10-CM

## 2023-07-24 NOTE — Therapy (Signed)
 OUTPATIENT PHYSICAL THERAPY PROSTHETIC TREATMENT    Patient Name: Tim Vincent MRN: [id number] DOB:[birth year], 68 y.o., male Today's Date: 07/24/2023  PCP: Nita Bast, NP  REFERRING PROVIDER: Gearldean Keepers, MD  END OF SESSION:  PT End of Session - 07/24/23 1614     Visit Number 4    Number of Visits 9    Date for PT Re-Evaluation 08/07/23   Due to delay in scheduling   Authorization Type [employer] Dual Complete    PT Start Time 1611    PT Stop Time 1700    PT Time Calculation (min) 49 min    Equipment Utilized During Treatment Gait belt   L AKA w/microprocessor knee   Activity Tolerance Patient tolerated treatment well    Behavior During Therapy WFL for tasks assessed/performed             Past Medical History:  Diagnosis Date   Cancer (HCC)    prostate ca   Constipation    Diabetes mellitus without complication (HCC)    type 2   Gout    HLD (hyperlipidemia)    Hypertension    Past Surgical History:  Procedure Laterality Date   AMPUTATION Left 04/17/2022   Procedure: LEFT ABOVE KNEE AMPUTATION;  Surgeon: Timothy Ford, MD;  Location: [facility];  Service: Orthopedics;  Laterality: Left;   MASS EXCISION Left 04/17/2021   Procedure: EXCISION LEFT ELBOW MASS;  Surgeon: Barb Bonito, MD;  Location: [facility];  Service: Plastics;  Laterality: Left;  1 hour   MASS EXCISION Left 08/10/2021   Procedure: EXCISION ELBOW MASS;  Surgeon: Barb Bonito, MD;  Location: [facility];  Service: Plastics;  Laterality: Left;  1 hour   MASS EXCISION Right 10/16/2022   Procedure: EXCISION OF MASS RIGHT ELBOW AND ARM;  Surgeon: Teretha Ferguson, MD;  Location: [facility];  Service: Plastics;  Laterality: Right;  1.5 hours   Patient Active Problem List   Diagnosis Date Noted   Diabetic infection of left foot (HCC) 04/17/2022   Necrotizing fasciitis of lower leg (HCC) 04/17/2022   Dyslipidemia 04/17/2022   Essential hypertension 01/12/2021   Obesity  01/12/2021   Skin sensation disturbance 01/12/2021   Type 2 diabetes mellitus without complication (HCC) 01/12/2021   Diabetic neuropathy (HCC) 01/12/2021   Hav (hallux abducto valgus), unspecified laterality 01/12/2021   Cough 10/31/2017   Elevated blood pressure reading 10/31/2017    ONSET DATE: 03/20/2023 MD referral to PT  REFERRING DIAG: A21.308 (ICD-10-CM) - Left above-knee amputee   THERAPY DIAG:  Other abnormalities of gait and mobility  Muscle weakness (generalized)  Unsteadiness on feet  History of falling  Rationale for Evaluation and Treatment: Rehabilitation  SUBJECTIVE:   SUBJECTIVE STATEMENT: Pt presents w/SPC and daughter.  He saw Delaney at [facility] where she witnessed a fall due to MPK malfunction.  Delaney and another orthotist looked at and adjusted his MPK and they believe it is working now.  A rep from the company was not available to assist Delaney.  They report and adjustment made to the ankle where his toes now feel like they are supporting him better in stance (PF adjustment?) and they made the prosthesis shorter.   Pt accompanied by: family member daughter interprets for session and is [facility] certified to do so, patient denies another interpreter   PERTINENT HISTORY: DM, neuropathy, HTN, left AKA 04/17/22, left elbow mass excision 04/17/21, 08/10/21 & 10/16/22, prostate CA, gout  PAIN:  Are you  having pain? No  PRECAUTIONS: None  WEIGHT BEARING RESTRICTIONS: No  FALLS: Has patient fallen in last 6 months? Yes. 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  He lifts up to 40-50#.   PLOF: Independent, Independent  with household mobility without device, and Independent with community mobility without device  PATIENT GOALS:  "walk without any support"   OBJECTIVE:   COGNITION: Overall cognitive status: Within functional limits for tasks assessed  POSTURE: rounded shoulders, forward head, and flexed trunk   BED MOBILITY:  Independent all aspects and no changes reported   TRANSFERS: Sit to stand: Modified independence with armrests and able to stabilize Stand to sit: Modified independence using armrests to control descent.    VITALS: RUE before interventions Vitals:   07/24/23 1628  BP: (!) 144/70  Pulse: 73   GAIT: Gait pattern: step through pattern, decreased step length- Right, decreased stance time- Left, Left hip hike, lateral hip instability, lateral lean- Right, and trunk flexed; Excessive hip flexion of LLE to ensure L knee is locked out prior to IC as pt is fearful of knee buckling  Distance walked: Various clinic distances  Assistive device utilized: Environmental consultant - 2 wheeled and Transfemoral Prosthesis Level of assistance: Modified independence Gait velocity:  comfortable self-selected pace 2.87 ft/s, which is improved from previous assessment of 2.36 ft/sec taken 03/2023  CURRENT PROSTHETIC WEAR ASSESSMENT: Patient is independent with: skin check, residual limb care, prosthetic cleaning, ply sock cleaning, and correct ply sock adjustment Donning prosthesis: Modified independence Doffing prosthesis: Modified independence Prosthetic wear tolerance: pt reports daily for most of awake hours every day Prosthetic weight bearing tolerance: No reported changes Edema: To be assessed  Residual limb condition: not assessed  Prosthetic description: Ischial Containment Socket with flexible inner socket, silicon liner with suction ring suspension, microprocessor knee K code/activity level with prosthetic use: Level 3   TREATMENT:  Self-care/home management  Assessed vitals (see above) and  WNL Wound assessment and education on general wound care for healing and monitoring for abnormal symptoms or functions following recent fall that might require urgent medical attention.  Wound on left forearm w/ lump, mild redness and scabbed over scrape.  Bruising on underside of right upper arm. Assessed free swing of MPK w/ more flexion resistance noted today.  NMR  Eccentric sits x10 slowly unsupported > x5 rapidly > x5 uncontrolled to assess MPK w/ good resistance noted today w/ full body weight applied w/o UE support Up and over 4" ramp to platform and down w/ BUE support practicing riding knee w/ some improvement of prosthetic knee flexion w/ repetition.  Progressed to step up and off 4" step w/ difficulty into toe off on prosthetic resulting in extension during push off.  STAIRS:  Level of Assistance: SBA  Stair Negotiation Technique: Alternating Pattern  with Bilateral Rails  Number of Stairs: 8   Height of Stairs: 4"  Comments: Repeated twice w/ pt having better stance control today and no LOB.  Some difficulty w/ toe off of prosthetic and some nervousness of descent due to this.  2" step downs for toe off improvement working into intermittent unsupported step off in // bars several rounds  Forward reach to chair seat riding mechanics of knee > regressed to chair back to allow better control into standing several reps    PATIENT EDUCATION: Education details: Appt w/ Bernerd Bright, MPK goals, concerns with current componentry.  Safety w/ current prosthesis. Person educated: Patient and Child(ren) Education method: Explanation, demonstration, verbal cues and handouts  Education comprehension: verbalized understanding, returned demonstration, verbal cues required, needs further education      HOME EXERCISE PROGRAM: Access Code: [email or web address] URL: [email or web address] Date: 07/16/2023 Prepared by: Burleigh Carp Plaster  Exercises - Staggered Sit-to-Stand  - 1 x daily - 7 x weekly -  3 sets - 10 reps -ECCENTRIC focus of STS in sturdy chair riding MPK component symmetrically as practiced 5/15 -Reaching to chair back or seat flexing the MPK for weight bearing   AMPUTEE SINK HEP (Provided to pt and daughter in [place] and spanish 5/16) Haz cada ejercicio 1-2 veces al da. Haz cada ejercicio 10 repeticiones. Mantenga cada ejercicio durante 2 segundos para sentir su ubicacin.   EN EL FREGADERO ENCUENTRE SU POSICIN EN LA LNEA MEDIA Y COLOQUE LOS PIES A LA IGUAL DISTANCIA DE LA LNEA MEDIA. Trate de encontrar esta posicin cuando est quieto para Arts development officer.   UTILICE CINTA EN EL PISO PARA MARCAR LA POSICIN DE LA LNEA MEDIA. Tambin debe intentar sentir con la extremidad la presin en la cavidad. Ests tratando de sentir con las extremidades lo que solas sentir con la planta del pie.   1. Desplazamiento de lado a lado: moviendo solo las caderas (no los hombros): [employer] el peso hacia la pierna izquierda, MANTENER/SENTIR. Vuelva a colocar el mismo peso en cada pierna, MANTENER/SENTIR. Mueva el peso sobre su pierna derecha, MANTENER/SENTIR. Vuelva a colocar el mismo peso en cada pierna, MANTENER/SENTIR. Repetir. Comience con ambas manos en el lavabo, avance solo hacia la derecha y luego sin manos. 2. Cambio de adelante hacia atrs: moviendo solo las caderas (no los hombros): [employer] el peso hacia adelante [employer] dedos de los pies, MANTENER/SENTIR. Mueva su peso hacia atrs para igualar el pie plano en ambas piernas, MANTENER/SENTIR. Mueva su peso nuevamente sobre sus talones, MANTENER/SENTIR. Mueva su peso hacia atrs para igualarlo en ambas piernas, MANTENER/SENTIR. Repetir. comience con ambas manos en el fregadero, avance solo [employer] mano derecha, luego sin [place]. 3. Conos/copas en movimiento: Con el mismo peso en cada pierna: Sujtese con una mano la primera vez, luego avance hasta no apoyar las manos. Mueva las tazas de un lado del fregadero al otro. Coloque los vasos  a ~2" fuera de su alcance, avance hasta 10" ms all de su alcance. Coloque una mano en el medio del fregadero y extienda la mano con la otra. Haz ambos brazos. Luego coloque una mano y mueva las tazas con la otra. 4. Alcanzar hacia arriba/hacia Cottie Diss: se alterna el alcance hasta los gabinetes superiores o el techo si no hay gabinetes presentes. Mantenga el mismo peso en cada pierna. Comience con Natha Bair apoyada en el mostrador mientras la otra mano se extiende y avance hasta no apoyar la mano al Barista. Coloque una mano en el medio del fregadero y extindala con la [place]. Haz ambos brazos. Luego coloque una mano y mueva las tazas con la otra. 5. Mirando por encima de los hombros: Con el mismo peso en cada pierna: gire alternativamente para mirar por encima de los hombros con una mano apoyada en el mostrador segn sea necesario. Comience con movimientos de la [place] solo para mirar delante del hombro, luego  incluso con el hombro y avance hasta mirar detrs de usted. Para mirar hacia un lado, mueva la cabeza/los ojos, luego el hombro del costado que mira Bethany, cambie ms peso hacia el costado y tire de la cadera hacia atrs. Coloca una mano en el medio del fregadero y suelta la otra para que tu hombro pueda retroceder. Cambia de mano para mirar hacia otro lado. Luego coloque una mano y mueva las tazas con la otra. 6. Al pisar con una pierna que no est amputada: retire de su camino los artculos que se encuentran debajo del gabinete. Mueva las caderas/pelvis para que el peso recaiga sobre la prtesis. PASE LENTAMENTE la otra pierna de modo que la parte delantera del pie quede dentro del gabinete. Luego retroceda al piso.     Walking Program Using RW  (Por favor use su andador.): [employer] caminar durante un cierto perodo de tiempo cada da                          Camine 10 minutos 1 vez al da.             Aumentar 2 minutos cada 7 das              Trabaje hasta 20 minutos seguidos (1 vez al  da).               Ejemplo:                         Da 1-2           4-5 minutos     3 veces al da                         Da 7-8           10-12 minutos 2-3 veces al da                         Da 13-14       20-22 minutos 1-2 veces al da     ASSESSMENT:  CLINICAL IMPRESSION: Emphasis of skilled session on reassessing MPK function and practicing utilizing eccentric control of componentry.  He has improved symmetrical stance for STS this visit, but continues to struggle with terminal stance/toe off to obtain knee flexion for fluid movement.  Will continue to address deficits and assess for active stumble recovery to improve mechanics of gait and overall independence.  OBJECTIVE IMPAIRMENTS: Abnormal gait, decreased activity tolerance, decreased balance, decreased coordination, decreased knowledge of use of DME, difficulty walking, improper body mechanics, and prosthetic dependency .   ACTIVITY LIMITATIONS: carrying, lifting, bending, standing, squatting, stairs, transfers, and locomotion level  PARTICIPATION LIMITATIONS: meal prep, cleaning, community activity, occupation, and yard work  PERSONAL FACTORS: Fitness, Past/current experiences, and 3+ comorbidities: see PMH are also affecting patient's functional outcome.   REHAB POTENTIAL: Good  CLINICAL DECISION MAKING: Evolving/moderate complexity  EVALUATION COMPLEXITY: Moderate   GOALS: Goals reviewed with patient? Yes  STG = LTG due to POC Length    LONG TERM GOALS:  Target date: 08/07/2023  Pt will improve 5 x STS to less than or equal to 15 seconds w/proper body mechanics to demonstrate improved functional strength and transfer efficiency.   Baseline: 16.03s w/LLE in kickstand, retropulsion and poor eccentric control  Goal status: INITIAL  2.  Pt will report no falls  w/microprocessor knee since eval for improved independence and reduced fall risk  Baseline: >10 falls  Goal status: INITIAL  3.  Pt will improve  gait velocity to at least 3.0 ft/s w/LRAD mod I for improved gait efficiency and functional use of LLE prosthetic    Baseline: 2.87 ft/s w/axillary crutch  Goal status: INITIAL    PLAN:  PT FREQUENCY: 1-2x/week  PT DURATION: 4 weeks with POC written out longer as patient needs to see [facility] prior to scheduling visits   PLANNED INTERVENTIONS: 97164- PT Re-evaluation, 97750- Physical Performance Testing, 97110-Therapeutic exercises, 97530- Therapeutic activity, [id number]- Neuromuscular re-education, 97535- Self Care, 16109- Gait training, [phone]- Orthotic/Prosthetic subsequent, Patient/Family education, Balance training, Stair training, Dry Needling, DME instructions, and Gait training  PLAN FOR NEXT SESSION: Monitor BP. Review/update HEP prn. Facilitation of L knee flexion in weightbearing (ramps/curbs), sit to stands; stair training, terminal stance knee flexion - maybe hurdles?  Progression of gait training to no AD in // bars  Earlean Glaze, PT, DPT 07/24/2023, 5:16 PM

## 2023-07-29 ENCOUNTER — Encounter: Payer: Self-pay | Admitting: Physical Therapy

## 2023-07-29 ENCOUNTER — Ambulatory Visit: Admitting: Physical Therapy

## 2023-07-29 DIAGNOSIS — R2689 Other abnormalities of gait and mobility: Secondary | ICD-10-CM

## 2023-07-29 DIAGNOSIS — Z9181 History of falling: Secondary | ICD-10-CM | POA: Diagnosis not present

## 2023-07-29 DIAGNOSIS — M6281 Muscle weakness (generalized): Secondary | ICD-10-CM | POA: Diagnosis not present

## 2023-07-29 DIAGNOSIS — R2231 Localized swelling, mass and lump, right upper limb: Secondary | ICD-10-CM | POA: Diagnosis not present

## 2023-07-29 DIAGNOSIS — R2681 Unsteadiness on feet: Secondary | ICD-10-CM

## 2023-07-29 NOTE — Therapy (Signed)
 OUTPATIENT PHYSICAL THERAPY PROSTHETIC TREATMENT    Patient Name: Tim Vincent MRN: [id number] DOB:1955-10-04, 68 y.o., male Today's Date: 07/30/2023  PCP: Nita Bast, NP  REFERRING PROVIDER: Gearldean Keepers, MD  END OF SESSION:  PT End of Session - 07/29/23 1623     Visit Number 5    Number of Visits 9    Date for PT Re-Evaluation 08/07/23   Due to delay in scheduling   Authorization Type [employer] Dual Complete    PT Start Time 1620    PT Stop Time 1701    PT Time Calculation (min) 41 min    Equipment Utilized During Treatment Gait belt   L AKA w/microprocessor knee   Activity Tolerance Patient tolerated treatment well    Behavior During Therapy WFL for tasks assessed/performed             Past Medical History:  Diagnosis Date   Cancer (HCC)    prostate ca   Constipation    Diabetes mellitus without complication (HCC)    type 2   Gout    HLD (hyperlipidemia)    Hypertension    Past Surgical History:  Procedure Laterality Date   AMPUTATION Left 04/17/2022   Procedure: LEFT ABOVE KNEE AMPUTATION;  Surgeon: Timothy Ford, MD;  Location: [facility];  Service: Orthopedics;  Laterality: Left;   MASS EXCISION Left 04/17/2021   Procedure: EXCISION LEFT ELBOW MASS;  Surgeon: Barb Bonito, MD;  Location: [facility];  Service: Plastics;  Laterality: Left;  1 hour   MASS EXCISION Left 08/10/2021   Procedure: EXCISION ELBOW MASS;  Surgeon: Barb Bonito, MD;  Location: [facility];  Service: Plastics;  Laterality: Left;  1 hour   MASS EXCISION Right 10/16/2022   Procedure: EXCISION OF MASS RIGHT ELBOW AND ARM;  Surgeon: Teretha Ferguson, MD;  Location: [facility];  Service: Plastics;  Laterality: Right;  1.5 hours   Patient Active Problem List   Diagnosis Date Noted   Diabetic infection of left foot (HCC) 04/17/2022   Necrotizing fasciitis of lower leg (HCC) 04/17/2022   Dyslipidemia 04/17/2022   Essential hypertension 01/12/2021   Obesity  01/12/2021   Skin sensation disturbance 01/12/2021   Type 2 diabetes mellitus without complication (HCC) 01/12/2021   Diabetic neuropathy (HCC) 01/12/2021   Hav (hallux abducto valgus), unspecified laterality 01/12/2021   Cough 10/31/2017   Elevated blood pressure reading 10/31/2017    ONSET DATE: 03/20/2023 MD referral to PT  REFERRING DIAG: W09.811 (ICD-10-CM) - Left above-knee amputee   THERAPY DIAG:  Other abnormalities of gait and mobility  Muscle weakness (generalized)  Unsteadiness on feet  History of falling  Rationale for Evaluation and Treatment: Rehabilitation  SUBJECTIVE:   SUBJECTIVE STATEMENT: Pt presents w/SPC and daughter.  He has not had further falls and feels a bit more confident on the prosthetic now.  He is going to [place] for 2 weeks (June 1-15).  His skin tear from prior fall is well healing.   Pt accompanied by: family member daughter interprets for session and is [facility] certified to do so, patient denies another interpreter   PERTINENT HISTORY: DM, neuropathy, HTN, left AKA 04/17/22, left elbow mass excision 04/17/21, 08/10/21 & 10/16/22, prostate CA, gout  PAIN:  Are you having pain? No  PRECAUTIONS: None  WEIGHT BEARING RESTRICTIONS: No  FALLS: Has patient fallen in last 6 months? Yes. 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  He lifts up to 40-50#.   PLOF: Independent, Independent with household mobility without device, and Independent with community mobility without device  PATIENT GOALS:  "walk without any support"   OBJECTIVE:   COGNITION: Overall cognitive status: Within functional limits for  tasks assessed  POSTURE: rounded shoulders, forward head, and flexed trunk   BED MOBILITY:  Independent all aspects and no changes reported   TRANSFERS: Sit to stand: Modified independence with armrests and able to stabilize Stand to sit: Modified independence using armrests to control descent.    VITALS: RUE before interventions There were no vitals filed for this visit.  GAIT: Gait pattern: step through pattern, decreased step length- Right, decreased stance time- Left, Left hip hike, lateral hip instability, lateral lean- Right, and trunk flexed; Excessive hip flexion of LLE to ensure L knee is locked out prior to IC as pt is fearful of knee buckling  Distance walked: Various clinic distances  Assistive device utilized: Environmental consultant - 2 wheeled and Transfemoral Prosthesis Level of assistance: Modified independence Gait velocity:  comfortable self-selected pace 2.87 ft/s, which is improved from previous assessment of 2.36 ft/sec taken 03/2023  CURRENT PROSTHETIC WEAR ASSESSMENT: Patient is independent with: skin check, residual limb care, prosthetic cleaning, ply sock cleaning, and correct ply sock adjustment Donning prosthesis: Modified independence Doffing prosthesis: Modified independence Prosthetic wear tolerance: pt reports daily for most of awake hours every day Prosthetic weight bearing tolerance: No reported changes Edema: To be assessed  Residual limb condition: not assessed  Prosthetic description: Ischial Containment Socket with flexible inner socket, silicon liner with suction ring suspension, microprocessor knee K code/activity level with prosthetic use: Level 3   TREATMENT:  RAMP:  Level of Assistance: SBA Assistive device utilized: Single point cane Ramp Comments: Much improved weight shift onto prosthesis today especially w/ incline.  CURB:  Level of Assistance: SBA and CGA Assistive device utilized: Single point cane Curb Comments: Continues to have trouble w/  prosthetic bend during toe off - some intermittent improvement when on more shallow curb outdoors.  STAIRS:  Level of Assistance: SBA and CGA  Stair Negotiation Technique: Alternating Pattern  Forwards with Bilateral Rails  Number of Stairs: 2x4   Height of Stairs: 6 inches  Comments: Pt takes too large of a step on ascent requiring cues to improve reciprocal pattern and anterior weight shift.  No LOB and descent appears smoother today.  GAIT: Gait pattern: step to pattern, step through pattern, decreased arm swing- Right, decreased step length- Left, and decreased stance time- Right Distance walked: 300 ft (outdoor unlevel sidewalk) + 115 ft (indoor level) Assistive device utilized: Single point cane Level of assistance: SBA and CGA Comments: Some improvement in weight shift, anterior pelvic translation on left hip.  Edu on addition of sock due to intermittent ER of prosthesis with prolonged distance.  He keeps sock tucked in socket when not donned - edu against this.  Discussed talking to [facility] at [place] if he feels he needs a smaller socket as he and daughter express concerns - PT not noting significant discrepancy this visit, but will continue to assess.   // bars:  several reps unsupported of forward walking to address anterior pelvic translation of the  left hip, lateral weight shift and width of BOS w/o RLE pulling to midline to support weight, how to practice safely at home w/ counter, stride weight shift practicing receiving weight and pushing weight w/ prosthesis.  SBA-CGA w/ return demo of weight shifting.  PATIENT EDUCATION: Education details: Discussed plan for re-cert and likely dropping to 1x/wk (possibly 2 if still needing repetitive practice for confidence and finding it hard to do so at home).  Additions to HEP. Person educated: Patient and Child(ren) Education method: Explanation, demonstration, verbal cues and handouts  Education comprehension: verbalized understanding,  returned demonstration, verbal cues required, needs further education      HOME EXERCISE PROGRAM: Access Code: [email or web address] URL: [email or web address] Date: 07/16/2023 Prepared by: Burleigh Carp Plaster  Exercises - Staggered Sit-to-Stand  - 1 x daily - 7 x weekly - 3 sets - 10 reps -ECCENTRIC focus of STS in sturdy chair riding MPK component symmetrically as practiced 5/15 -Reaching to chair back or seat flexing the MPK for weight bearing -Stride weight shifts -Walking unsupported beside countertop   AMPUTEE SINK HEP (Provided to pt and daughter in [place] and spanish 5/16) Haz cada ejercicio 1-2 veces al da. Haz cada ejercicio 10 repeticiones. Mantenga cada ejercicio durante 2 segundos para sentir su ubicacin.   EN EL FREGADERO ENCUENTRE SU POSICIN EN LA LNEA MEDIA Y COLOQUE LOS PIES A LA IGUAL DISTANCIA DE LA LNEA MEDIA. Trate de encontrar esta posicin cuando est quieto para Arts development officer.   UTILICE CINTA EN EL PISO PARA MARCAR LA POSICIN DE LA LNEA MEDIA. Tambin debe intentar sentir con la extremidad la presin en la cavidad. Ests tratando de sentir con las extremidades lo que solas sentir con la planta del pie.   1. Desplazamiento de lado a lado: moviendo solo las caderas (no los hombros): [employer] el peso hacia la pierna izquierda, MANTENER/SENTIR. Vuelva a colocar el mismo peso en cada pierna, MANTENER/SENTIR. Mueva el peso sobre su pierna derecha, MANTENER/SENTIR. Vuelva a colocar el mismo peso en cada pierna, MANTENER/SENTIR. Repetir. Comience con ambas manos en el lavabo, avance solo hacia la derecha y luego sin manos. 2. Cambio de adelante hacia atrs: moviendo solo las caderas (no los hombros): [employer] el peso hacia adelante [employer] dedos de los pies, MANTENER/SENTIR. Mueva su peso hacia atrs para igualar el pie plano en ambas piernas, MANTENER/SENTIR. Mueva su peso nuevamente sobre sus talones, MANTENER/SENTIR. Mueva su peso hacia atrs para igualarlo en  ambas piernas, MANTENER/SENTIR. Repetir. comience con ambas manos en el fregadero, avance solo [employer] mano derecha, luego sin [place]. 3. Conos/copas en movimiento: Con el mismo peso en cada pierna: Sujtese con una mano la primera vez, luego avance hasta no apoyar las manos. Mueva las tazas de un lado del fregadero al otro. Coloque los vasos a ~2" fuera de su alcance, avance hasta 10" ms all de su alcance. Coloque una mano en el medio del fregadero y extienda la mano con la otra. Haz ambos brazos. Luego coloque una mano y mueva las tazas con la otra. 4. Alcanzar hacia arriba/hacia Cottie Diss: se alterna el alcance hasta los gabinetes superiores o el techo si no hay gabinetes presentes. Mantenga el mismo peso en cada pierna. Comience con Natha Bair apoyada en el mostrador mientras la otra mano se extiende y avance hasta no apoyar la mano al Barista. Coloque una mano en el medio del fregadero y extindala con la [place]. Haz ambos brazos. Luego coloque una mano y mueva las tazas con la otra. 5. Mirando  por encima de los hombros: Con el mismo peso en cada pierna: gire alternativamente para mirar por encima de los hombros con una mano apoyada en el mostrador segn sea necesario. Comience con movimientos de la [place] solo para mirar delante del hombro, luego incluso con el hombro y avance hasta mirar detrs de usted. Para mirar hacia un lado, mueva la cabeza/los ojos, luego el hombro del costado que mira [place], cambie ms peso hacia el costado y tire de la cadera hacia atrs. Coloca una mano en el medio del fregadero y suelta la otra para que tu hombro pueda retroceder. Cambia de mano para mirar hacia otro lado. Luego coloque una mano y mueva las tazas con la otra. 6. Al pisar con una pierna que no est amputada: retire de su camino los artculos que se encuentran debajo del gabinete. Mueva las caderas/pelvis para que el peso recaiga sobre la prtesis. PASE LENTAMENTE la otra pierna de modo que la parte  delantera del pie quede dentro del gabinete. Luego retroceda al piso.     Walking Program Using RW  (Por favor use su andador.): [employer] caminar durante un cierto perodo de tiempo cada da                          Camine 10 minutos 1 vez al da.             Aumentar 2 minutos cada 7 das              Trabaje hasta 20 minutos seguidos (1 vez al da).               Ejemplo:                         Da 1-2           4-5 minutos     3 veces al da                         Da 7-8           10-12 minutos 2-3 veces al da                         Da 13-14       20-22 minutos 1-2 veces al da  ASSESSMENT:  CLINICAL IMPRESSION: Focus of skilled session on continuing to improve MPK use with ambulation unassisted and over variable surfaces.  He has some improved stride today especially over level surface.  His mechanics and safety seem to have improved his confidence and PT added unsupported walking to his HEP along with pre-gait weight shifting.  He continues to have some difficulty with terminal stance/toe off resulting in decreased fluidity of stair management and gait.  PT to continue to work on this to maximize benefit of new componentry per POC.  OBJECTIVE IMPAIRMENTS: Abnormal gait, decreased activity tolerance, decreased balance, decreased coordination, decreased knowledge of use of DME, difficulty walking, improper body mechanics, and prosthetic dependency .   ACTIVITY LIMITATIONS: carrying, lifting, bending, standing, squatting, stairs, transfers, and locomotion level  PARTICIPATION LIMITATIONS: meal prep, cleaning, community activity, occupation, and yard work  PERSONAL FACTORS: Fitness, Past/current experiences, and 3+ comorbidities: see PMH are also affecting patient's functional outcome.   REHAB POTENTIAL: Good  CLINICAL DECISION MAKING: Evolving/moderate complexity  EVALUATION COMPLEXITY: Moderate   GOALS: Goals reviewed with patient?  Yes  STG = LTG due to POC Length     LONG TERM GOALS:  Target date: 08/07/2023  Pt will improve 5 x STS to less than or equal to 15 seconds w/proper body mechanics to demonstrate improved functional strength and transfer efficiency.   Baseline: 16.03s w/LLE in kickstand, retropulsion and poor eccentric control  Goal status: INITIAL  2.  Pt will report no falls w/microprocessor knee since eval for improved independence and reduced fall risk  Baseline: >10 falls  Goal status: INITIAL  3.  Pt will improve gait velocity to at least 3.0 ft/s w/LRAD mod I for improved gait efficiency and functional use of LLE prosthetic    Baseline: 2.87 ft/s w/axillary crutch  Goal status: INITIAL    PLAN:  PT FREQUENCY: 1-2x/week  PT DURATION: 4 weeks with POC written out longer as patient needs to see [facility] prior to scheduling visits   PLANNED INTERVENTIONS: 97164- PT Re-evaluation, 97750- Physical Performance Testing, 97110-Therapeutic exercises, 97530- Therapeutic activity, [id number]- Neuromuscular re-education, 97535- Self Care, 16109- Gait training, [phone]- Orthotic/Prosthetic subsequent, Patient/Family education, Balance training, Stair training, Dry Needling, DME instructions, and Gait training  PLAN FOR NEXT SESSION: Monitor BP. Review/update HEP prn. Facilitation of L knee flexion in weightbearing (ramps/curbs), sit to stands; stair training, terminal stance knee flexion - maybe hurdles?  Progression of gait training to no AD overground, turns!  When re-certing:  pt is going to [place] from June 1-15.  Earlean Glaze, PT, DPT 07/30/2023, 8:45 AM

## 2023-07-31 ENCOUNTER — Ambulatory Visit: Admitting: Physical Therapy

## 2023-07-31 VITALS — BP 167/78 | HR 65

## 2023-07-31 DIAGNOSIS — R2681 Unsteadiness on feet: Secondary | ICD-10-CM | POA: Diagnosis not present

## 2023-07-31 DIAGNOSIS — M6281 Muscle weakness (generalized): Secondary | ICD-10-CM | POA: Diagnosis not present

## 2023-07-31 DIAGNOSIS — R2689 Other abnormalities of gait and mobility: Secondary | ICD-10-CM

## 2023-07-31 DIAGNOSIS — Z9181 History of falling: Secondary | ICD-10-CM | POA: Diagnosis not present

## 2023-07-31 NOTE — Therapy (Signed)
 OUTPATIENT PHYSICAL THERAPY PROSTHETIC TREATMENT    Patient Name: Tim Vincent MRN: [id number] DOB:12/18/1955, 68 y.o., male Today's Date: 07/31/2023  PCP: Nita Bast, NP  REFERRING PROVIDER: Gearldean Keepers, MD  END OF SESSION:  PT End of Session - 07/31/23 1455     Visit Number 6    Number of Visits 9    Date for PT Re-Evaluation 08/07/23   Due to delay in scheduling   Authorization Type [employer] Dual Complete    PT Start Time 1454   Pt arrived late   PT Stop Time 1534    PT Time Calculation (min) 40 min    Equipment Utilized During Treatment Gait belt   L AKA w/microprocessor knee   Activity Tolerance Patient tolerated treatment well    Behavior During Therapy WFL for tasks assessed/performed             Past Medical History:  Diagnosis Date   Cancer (HCC)    prostate ca   Constipation    Diabetes mellitus without complication (HCC)    type 2   Gout    HLD (hyperlipidemia)    Hypertension    Past Surgical History:  Procedure Laterality Date   AMPUTATION Left 04/17/2022   Procedure: LEFT ABOVE KNEE AMPUTATION;  Surgeon: Timothy Ford, MD;  Location: [facility];  Service: Orthopedics;  Laterality: Left;   MASS EXCISION Left 04/17/2021   Procedure: EXCISION LEFT ELBOW MASS;  Surgeon: Barb Bonito, MD;  Location: [facility];  Service: Plastics;  Laterality: Left;  1 hour   MASS EXCISION Left 08/10/2021   Procedure: EXCISION ELBOW MASS;  Surgeon: Barb Bonito, MD;  Location: [facility];  Service: Plastics;  Laterality: Left;  1 hour   MASS EXCISION Right 10/16/2022   Procedure: EXCISION OF MASS RIGHT ELBOW AND ARM;  Surgeon: Teretha Ferguson, MD;  Location: [facility];  Service: Plastics;  Laterality: Right;  1.5 hours   Patient Active Problem List   Diagnosis Date Noted   Diabetic infection of left foot (HCC) 04/17/2022   Necrotizing fasciitis of lower leg (HCC) 04/17/2022   Dyslipidemia 04/17/2022   Essential hypertension 01/12/2021    Obesity 01/12/2021   Skin sensation disturbance 01/12/2021   Type 2 diabetes mellitus without complication (HCC) 01/12/2021   Diabetic neuropathy (HCC) 01/12/2021   Hav (hallux abducto valgus), unspecified laterality 01/12/2021   Cough 10/31/2017   Elevated blood pressure reading 10/31/2017    ONSET DATE: 03/20/2023 MD referral to PT  REFERRING DIAG: W29.562 (ICD-10-CM) - Left above-knee amputee   THERAPY DIAG:  Other abnormalities of gait and mobility  Muscle weakness (generalized)  History of falling  Rationale for Evaluation and Treatment: Rehabilitation  SUBJECTIVE:   SUBJECTIVE STATEMENT: Pt presents w/SPC and daughter. Denies falls and pain today. Has been working on his HEP, thinks it is going well.    Pt accompanied by: family member daughter interprets for session and is [facility] certified to do so, patient denies another interpreter   PERTINENT HISTORY: DM, neuropathy, HTN, left AKA 04/17/22, left elbow mass excision 04/17/21, 08/10/21 & 10/16/22, prostate CA, gout  PAIN:  Are you having pain? No  PRECAUTIONS: None  WEIGHT BEARING RESTRICTIONS: No  FALLS: Has patient fallen in last 6 months? Yes. 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  He lifts up to 40-50#.   PLOF: Independent, Independent with household mobility without device, and Independent with community mobility without device  PATIENT GOALS:  "walk without any support"   OBJECTIVE:   COGNITION: Overall cognitive status: Within functional limits for tasks assessed  POSTURE: rounded shoulders, forward head, and flexed trunk   BED MOBILITY:  Independent  all aspects and no changes reported   TRANSFERS: Sit to stand: Modified independence with armrests and able to stabilize Stand to sit: Modified independence using armrests to control descent.    VITALS: RUE before interventions Vitals:   07/31/23 1456  BP: (!) 167/78  Pulse: 65    GAIT: Gait pattern: step through pattern, decreased step length- Right, decreased stance time- Left, Left hip hike, lateral hip instability, lateral lean- Right, and trunk flexed; Excessive hip flexion of LLE to ensure L knee is locked out prior to IC as pt is fearful of knee buckling  Distance walked: Various clinic distances  Assistive device utilized: Environmental consultant - 2 wheeled and Transfemoral Prosthesis Level of assistance: Modified independence Gait velocity:  comfortable self-selected pace 2.87 ft/s, which is improved from previous assessment of 2.36 ft/sec taken 03/2023  CURRENT PROSTHETIC WEAR ASSESSMENT: Patient is independent with: skin check, residual limb care, prosthetic cleaning, ply sock cleaning, and correct ply sock adjustment Donning prosthesis: Modified independence Doffing prosthesis: Modified independence Prosthetic wear tolerance: pt reports daily for most of awake hours every day Prosthetic weight bearing tolerance: No reported changes Edema: To be assessed  Residual limb condition: not assessed  Prosthetic description: Ischial Containment Socket with flexible inner socket, silicon liner with suction ring suspension, microprocessor knee K code/activity level with prosthetic use: Level 3   TREATMENT:  Self-care/home management  Assessed vitals (see above) and systolic BP elevated but WNL   Gait Training  In // bars for improved facilitation of terminal stance on LLE, knee flexion w/swing rather than circumduction, anticipatory balance strategies and safety w/microprocessor knee:  Repeated reps of unsupported fwd gait and noted circumduction of LLE, wide BOS and compensated trendelenburg.  Pt demonstrates decreased step length w/RLE resulting in lack of TS- pre-swing position. Pt bracing against L bar to stabilize, but reported he did not require bar to stabilize. Cued pt to not use bar, but pt unable to do so.  4" hurdle navigation w/intermittent UE support to work on L TS and swing phase as well as reduction of circumduction when clearing obstacles. Pt extremely challenged by this and demonstrated excessive trunk flexion to attempt to flex L knee rather than achieve TS position and facilitate hip flexion. Max multimodal cues for upright posture, anterior hip translation on R and L hip flexion to progress LLE. Pt requesting to perform without hurdles first and demonstrated slight steppage pattern on LLE but did narrow his BOS w/reduced compensated trendelenburg on LLE. Added in single hurdle and pt performed several reps of stepping over single hurdle leading w/LLE. Pt initially attempting to clear hurdle prematurely, resulting in leg knocking hurdle over when attempting IC. After several reps, pt able to clear hurdle well but does require BUE support and could benefit from continued practice.    STAIRS:  Level of Assistance: SBA and CGA  Stair Negotiation Technique: Alternating Pattern  Forwards with Bilateral Rails  Number of Stairs: 4x12   Height of Stairs: 6 inches  Comments: Pt continues to take too large of a step w/LLE on ascent and places foot on edge of step, requiring heavy BUE support to pull himself up steps. On descent, pt leaning heavily to R side when leading w/RLE to maintain knee extension of LLE. Max multimodal cues to work on midline orientation and allowing L knee to flex when leading w/RLE. After several repetitions, pt able to do this more confidently and comfortably.     PATIENT EDUCATION: Education details: Discussed POC moving forward and scheduled 8 appointments for 1x/week following trip to [place]  Person educated: Patient and Child(ren) Education method:  Explanation, demonstration, verbal cues  Education comprehension: verbalized understanding, returned demonstration, verbal cues required, needs further education      HOME EXERCISE PROGRAM: Access Code: [email or web address] URL: [email or web address] Date: 07/16/2023 Prepared by: Burleigh Carp Savilla Turbyfill  Exercises - Staggered Sit-to-Stand  - 1 x daily - 7 x weekly - 3 sets - 10 reps -ECCENTRIC focus of STS in sturdy chair riding MPK component symmetrically as practiced 5/15 -Reaching to chair back or seat flexing the MPK for weight bearing -Stride weight shifts -Walking unsupported beside countertop   AMPUTEE SINK HEP (Provided to pt and daughter in [place] and spanish 5/16) Haz cada ejercicio 1-2 veces al da. Haz cada ejercicio 10 repeticiones. Mantenga cada ejercicio durante 2 segundos para sentir su ubicacin.   EN EL FREGADERO ENCUENTRE SU POSICIN EN LA LNEA MEDIA Y COLOQUE LOS PIES A LA IGUAL DISTANCIA DE LA LNEA MEDIA. Trate de encontrar esta posicin cuando est quieto para Arts development officer.   UTILICE CINTA EN EL PISO PARA MARCAR LA POSICIN DE LA LNEA MEDIA. Tambin debe intentar sentir con la extremidad la presin en la cavidad. Ests tratando de sentir con las extremidades lo que solas sentir con la planta del pie.   1. Desplazamiento de lado a lado: moviendo solo las caderas (no los hombros): [employer] el peso hacia la pierna izquierda, MANTENER/SENTIR. Vuelva a colocar el mismo peso en cada pierna, MANTENER/SENTIR. Mueva el peso sobre su pierna derecha, MANTENER/SENTIR. Vuelva a colocar el mismo peso en cada pierna, MANTENER/SENTIR. Repetir. Comience con ambas manos en el lavabo, avance solo hacia la derecha y luego sin manos. 2. Cambio de adelante hacia atrs: moviendo solo las caderas (no los hombros): [employer] el peso hacia adelante [employer] dedos de los pies, MANTENER/SENTIR. Mueva su peso hacia atrs para igualar el pie plano en ambas piernas, MANTENER/SENTIR. Mueva su peso  nuevamente sobre sus talones, MANTENER/SENTIR. Mueva su peso hacia atrs para igualarlo en ambas piernas, MANTENER/SENTIR. Repetir. comience con ambas manos en el fregadero, avance solo [employer] mano derecha, luego sin [place]. 3. Conos/copas en movimiento: Con el mismo peso en cada pierna: Sujtese con una mano la primera vez, luego avance hasta no apoyar las manos. Mueva las tazas de un lado del fregadero al otro. Coloque los vasos a ~2" fuera de su alcance, avance hasta 10" ms all de su alcance. Coloque una mano en el medio del fregadero y extienda la mano con la otra. Haz ambos brazos. Luego coloque una mano y mueva las tazas con la otra. 4. Alcanzar hacia arriba/hacia [place]: se alterna el alcance hasta los gabinetes superiores o el techo si no hay gabinetes presentes. Mantenga el mismo peso en cada pierna. Comience con Natha Bair apoyada en el mostrador mientras la otra mano se extiende y avance hasta no apoyar la mano al [place]. Coloque una mano en el medio del fregadero y extindala con la [place].  Haz ambos brazos. Luego coloque una mano y mueva las tazas con la otra. 5. Mirando por encima de los hombros: Con el mismo peso en cada pierna: gire alternativamente para mirar por encima de los hombros con una mano apoyada en el mostrador segn sea necesario. Comience con movimientos de la [place] solo para mirar delante del hombro, luego incluso con el hombro y avance hasta mirar detrs de usted. Para mirar hacia un lado, mueva la cabeza/los ojos, luego el hombro del costado que mira [place], cambie ms peso hacia el costado y tire de la cadera hacia atrs. Coloca una mano en el medio del fregadero y suelta la otra para que tu hombro pueda retroceder. Cambia de mano para mirar hacia otro lado. Luego coloque una mano y mueva las tazas con la otra. 6. Al pisar con una pierna que no est amputada: retire de su camino los artculos que se encuentran debajo del gabinete. Mueva las caderas/pelvis para que el  peso recaiga sobre la prtesis. PASE LENTAMENTE la otra pierna de modo que la parte delantera del pie quede dentro del gabinete. Luego retroceda al piso.     Walking Program Using RW  (Por favor use su andador.): [employer] caminar durante un cierto perodo de tiempo cada da                          Camine 10 minutos 1 vez al da.             Aumentar 2 minutos cada 7 das              Trabaje hasta 20 minutos seguidos (1 vez al da).               Ejemplo:                         Da 1-2           4-5 minutos     3 veces al da                         Da 7-8           10-12 minutos 2-3 veces al da                         Da 13-14       20-22 minutos 1-2 veces al da  ASSESSMENT:  CLINICAL IMPRESSION: Emphasis of skilled PT session on continued gait training w/L MPK w/emphasis on terminal stance/toe-off to facilitate L knee flexion and postural control. Pt demonstrates circumduction of LLE this date w/significant compensated trendelenburg on L, requiring bar on L side to stabilize. Max multimodal cues to facilitate increased step length of RLE w/anterior pelvic translation and proper flexion of L knee during hurdles, unsupported gait and stairs. Pt demonstrates significant R lateral lean when descending stairs w/RLE in attempt to avoid flexion of L knee, but was able to facilitate L knee flexion w/max cues and repetition. Continue POC.   OBJECTIVE IMPAIRMENTS: Abnormal gait, decreased activity tolerance, decreased balance, decreased coordination, decreased knowledge of use of DME, difficulty walking, improper body mechanics, and prosthetic dependency .   ACTIVITY LIMITATIONS: carrying, lifting, bending, standing, squatting, stairs, transfers, and locomotion level  PARTICIPATION LIMITATIONS: meal prep, cleaning, community activity, occupation, and yard work  PERSONAL FACTORS: Fitness, Past/current experiences, and 3+ comorbidities: see PMH  are also affecting patient's functional outcome.    REHAB POTENTIAL: Good  CLINICAL DECISION MAKING: Evolving/moderate complexity  EVALUATION COMPLEXITY: Moderate   GOALS: Goals reviewed with patient? Yes  STG = LTG due to POC Length    LONG TERM GOALS:  Target date: 08/07/2023  Pt will improve 5 x STS to less than or equal to 15 seconds w/proper body mechanics to demonstrate improved functional strength and transfer efficiency.   Baseline: 16.03s w/LLE in kickstand, retropulsion and poor eccentric control  Goal status: INITIAL  2.  Pt will report no falls w/microprocessor knee since eval for improved independence and reduced fall risk  Baseline: >10 falls  Goal status: INITIAL  3.  Pt will improve gait velocity to at least 3.0 ft/s w/LRAD mod I for improved gait efficiency and functional use of LLE prosthetic    Baseline: 2.87 ft/s w/axillary crutch  Goal status: INITIAL    PLAN:  PT FREQUENCY: 1-2x/week  PT DURATION: 4 weeks with POC written out longer as patient needs to see [facility] prior to scheduling visits   PLANNED INTERVENTIONS: 97164- PT Re-evaluation, 97750- Physical Performance Testing, 97110-Therapeutic exercises, 97530- Therapeutic activity, [id number]- Neuromuscular re-education, 97535- Self Care, 16109- Gait training, [phone]- Orthotic/Prosthetic subsequent, Patient/Family education, Balance training, Stair training, Dry Needling, DME instructions, and Gait training  PLAN FOR NEXT SESSION: Monitor BP. Review/update HEP prn. Facilitation of L knee flexion in weightbearing (ramps/curbs), sit to stands; stair training, terminal stance knee flexion - maybe hurdles?  Progression of gait training to no AD overground, turns!  When re-certing:  pt is going to [place] from June 1-15.  Aaliya Maultsby E Monya Kozakiewicz, PT, DPT 07/31/2023, 3:34 PM

## 2023-08-05 ENCOUNTER — Encounter: Payer: Self-pay | Admitting: Physical Therapy

## 2023-08-05 ENCOUNTER — Ambulatory Visit: Admitting: Physical Therapy

## 2023-08-05 VITALS — BP 159/60 | HR 70

## 2023-08-05 DIAGNOSIS — R2681 Unsteadiness on feet: Secondary | ICD-10-CM

## 2023-08-05 DIAGNOSIS — M6281 Muscle weakness (generalized): Secondary | ICD-10-CM

## 2023-08-05 DIAGNOSIS — Z9181 History of falling: Secondary | ICD-10-CM

## 2023-08-05 DIAGNOSIS — R2689 Other abnormalities of gait and mobility: Secondary | ICD-10-CM

## 2023-08-05 NOTE — Therapy (Signed)
 OUTPATIENT PHYSICAL THERAPY PROSTHETIC TREATMENT    Patient Name: Tim Vincent MRN: [id number] DOB:1955/10/22, 68 y.o., male Today's Date: 08/05/2023  PCP: Nita Bast, NP  REFERRING PROVIDER: Gearldean Keepers, MD  END OF SESSION:  PT End of Session - 08/05/23 1321     Visit Number 7    Number of Visits 9    Date for PT Re-Evaluation 08/07/23   Due to delay in scheduling   Authorization Type [employer] Dual Complete    PT Start Time 1315    PT Stop Time 1400    PT Time Calculation (min) 45 min    Equipment Utilized During Treatment Gait belt   L AKA w/microprocessor knee   Activity Tolerance Patient tolerated treatment well    Behavior During Therapy WFL for tasks assessed/performed             Past Medical History:  Diagnosis Date   Cancer (HCC)    prostate ca   Constipation    Diabetes mellitus without complication (HCC)    type 2   Gout    HLD (hyperlipidemia)    Hypertension    Past Surgical History:  Procedure Laterality Date   AMPUTATION Left 04/17/2022   Procedure: LEFT ABOVE KNEE AMPUTATION;  Surgeon: Timothy Ford, MD;  Location: [facility];  Service: Orthopedics;  Laterality: Left;   MASS EXCISION Left 04/17/2021   Procedure: EXCISION LEFT ELBOW MASS;  Surgeon: Barb Bonito, MD;  Location: [facility];  Service: Plastics;  Laterality: Left;  1 hour   MASS EXCISION Left 08/10/2021   Procedure: EXCISION ELBOW MASS;  Surgeon: Barb Bonito, MD;  Location: [facility];  Service: Plastics;  Laterality: Left;  1 hour   MASS EXCISION Right 10/16/2022   Procedure: EXCISION OF MASS RIGHT ELBOW AND ARM;  Surgeon: Teretha Ferguson, MD;  Location: [facility];  Service: Plastics;  Laterality: Right;  1.5 hours   Patient Active Problem List   Diagnosis Date Noted   Diabetic infection of left foot (HCC) 04/17/2022   Necrotizing fasciitis of lower leg (HCC) 04/17/2022   Dyslipidemia 04/17/2022   Essential hypertension 01/12/2021   Obesity  01/12/2021   Skin sensation disturbance 01/12/2021   Type 2 diabetes mellitus without complication (HCC) 01/12/2021   Diabetic neuropathy (HCC) 01/12/2021   Hav (hallux abducto valgus), unspecified laterality 01/12/2021   Cough 10/31/2017   Elevated blood pressure reading 10/31/2017    ONSET DATE: 03/20/2023 MD referral to PT  REFERRING DIAG: W09.811 (ICD-10-CM) - Left above-knee amputee   THERAPY DIAG:  Other abnormalities of gait and mobility  Muscle weakness (generalized)  History of falling  Unsteadiness on feet  Rationale for Evaluation and Treatment: Rehabilitation  SUBJECTIVE:   SUBJECTIVE STATEMENT: Pt presents w/SPC and daughter. Denies falls and pain today.    Pt accompanied by: family member daughter interprets for session and is [facility] certified to do so, patient denies another interpreter   PERTINENT HISTORY: DM, neuropathy, HTN, left AKA 04/17/22, left elbow mass excision 04/17/21, 08/10/21 & 10/16/22, prostate CA, gout  PAIN:  Are you having pain? No  PRECAUTIONS: None  WEIGHT BEARING RESTRICTIONS: No  FALLS: Has patient fallen in last 6 months? Yes. 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  He lifts up to 40-50#.   PLOF: Independent, Independent with household mobility without device, and Independent with community mobility without device  PATIENT GOALS:  "walk without any support"   OBJECTIVE:   COGNITION: Overall cognitive status: Within functional limits for tasks assessed  POSTURE: rounded shoulders, forward head, and flexed trunk   BED MOBILITY:  Independent all aspects and no changes reported    TRANSFERS: Sit to stand: Modified independence with armrests and able to stabilize Stand to sit: Modified independence using armrests to control descent.    VITALS: LUE before interventions Vitals:   08/05/23 1318  BP: (!) 159/60  Pulse: 70    GAIT: Gait pattern: step through pattern, decreased step length- Right, decreased stance time- Left, Left hip hike, lateral hip instability, lateral lean- Right, and trunk flexed; Excessive hip flexion of LLE to ensure L knee is locked out prior to IC as pt is fearful of knee buckling  Distance walked: Various clinic distances  Assistive device utilized: Environmental consultant - 2 wheeled and Transfemoral Prosthesis Level of assistance: Modified independence Gait velocity:  comfortable self-selected pace 2.87 ft/s, which is improved from previous assessment of 2.36 ft/sec taken 03/2023  CURRENT PROSTHETIC WEAR ASSESSMENT: Patient is independent with: skin check, residual limb care, prosthetic cleaning, ply sock cleaning, and correct ply sock adjustment Donning prosthesis: Modified independence Doffing prosthesis: Modified independence Prosthetic wear tolerance: pt reports daily for most of awake hours every day Prosthetic weight bearing tolerance: No reported changes Edema: To be assessed  Residual limb condition: not assessed  Prosthetic description: Ischial Containment Socket with flexible inner socket, silicon liner with suction ring suspension, microprocessor knee K code/activity level with prosthetic use: Level 3   TREATMENT:  Self-care/home management  Assessed vitals (see above) and systolic BP elevated but WNL   NMR: STS variations:  STS x10, cued to reposition prosthesis to prevent sliding forward w/ improvement in symmetry > STS w/ mirror feedback and RLE on 2" step x10, x4, x7 w/ pt drawing x for visual cue to line nose up with during transfer > STS w/ 6lb ball hold x10 Stride stance 6lb slam balls x12 each LE in rear, CGA due to mild  posterior waver STS into immediate alternating step x8 each side, step size decreased and forward trunk increased w/ fatigue, CGA  Gait Training  In // bars for improved facilitation of terminal stance on LLE and better engagement of left glut med for hip strategy and general stance phase:  Fwd/backward gait 4x8 ft each unsupported using step to pattern w/ RLE leading Lateral stepping unsupported w/ several instances of LOB and good stepping strategy used.  Discussed and demonstrated weight shift over prosthesis several times for improved pt understanding of goal of mechanics with practice.  He has some improvement prior to fatigue w/ several reps the length of bars.  He has a tendency to rotate towards prosthetic side when stepping left due to functional hip weakness. Pt ambulates x200 ft SBA-CGA no AD w/ ongoing compensated trendelenburg and some improvement in prosthetic swing through  PATIENT EDUCATION: Education details: Reminder of plan for re-cert.  Continue practicing walking w/o device on flat path in home (kitchen or hallway) w/ turns for improved weight shift onto prosthetic side.   Person educated: Patient and Child(ren) Education method: Explanation, demonstration, verbal cues  Education comprehension: verbalized understanding, returned demonstration, verbal cues required, needs further education  HOME EXERCISE PROGRAM: Access Code: [email or web address] URL: [email or web address] Date: 07/16/2023 Prepared by: Burleigh Carp Plaster  Exercises - Staggered Sit-to-Stand  - 1 x daily - 7 x weekly - 3 sets - 10 reps -ECCENTRIC focus of STS in sturdy chair riding MPK component symmetrically as practiced 5/15 -Reaching to chair back or seat flexing the MPK for weight bearing -Stride weight shifts -Walking unsupported beside countertop   AMPUTEE SINK HEP (Provided to pt and daughter in [place] and spanish 5/16) Haz cada ejercicio 1-2 veces al da. Haz cada ejercicio 10  repeticiones. Mantenga cada ejercicio durante 2 segundos para sentir su ubicacin.   EN EL FREGADERO ENCUENTRE SU POSICIN EN LA LNEA MEDIA Y COLOQUE LOS PIES A LA IGUAL DISTANCIA DE LA LNEA MEDIA. Trate de encontrar esta posicin cuando est quieto para Arts development officer.   UTILICE CINTA EN EL PISO PARA MARCAR LA POSICIN DE LA LNEA MEDIA. Tambin debe intentar sentir con la extremidad la presin en la cavidad. Ests tratando de sentir con las extremidades lo que solas sentir con la planta del pie.   1. Desplazamiento de lado a lado: moviendo solo las caderas (no los hombros): [employer] el peso hacia la pierna izquierda, MANTENER/SENTIR. Vuelva a colocar el mismo peso en cada pierna, MANTENER/SENTIR. Mueva el peso sobre su pierna derecha, MANTENER/SENTIR. Vuelva a colocar el mismo peso en cada pierna, MANTENER/SENTIR. Repetir. Comience con ambas manos en el lavabo, avance solo hacia la derecha y luego sin manos. 2. Cambio de adelante hacia atrs: moviendo solo las caderas (no los hombros): [employer] el peso hacia adelante [employer] dedos de los pies, MANTENER/SENTIR. Mueva su peso hacia atrs para igualar el pie plano en ambas piernas, MANTENER/SENTIR. Mueva su peso nuevamente sobre sus talones, MANTENER/SENTIR. Mueva su peso hacia atrs para igualarlo en ambas piernas, MANTENER/SENTIR. Repetir. comience con ambas manos en el fregadero, avance solo [employer] mano derecha, luego sin [employer]. 3. Conos/copas en movimiento: Con el mismo peso en cada pierna: Sujtese con una mano la primera vez, luego avance hasta no apoyar las manos. Mueva las tazas de un lado del fregadero al otro. Coloque los vasos a ~2" fuera de su alcance, avance hasta 10" ms all de su alcance. Coloque una mano en el medio del fregadero y extienda la mano con la otra. Haz ambos brazos. Luego coloque una mano y mueva las tazas con la otra. 4. Alcanzar hacia arriba/hacia Cottie Diss: se alterna el alcance hasta los gabinetes superiores o el techo  si no hay gabinetes presentes. Mantenga el mismo peso en cada pierna. Comience con Natha Bair apoyada en el mostrador mientras la otra mano se extiende y avance hasta no apoyar la mano al Barista. Coloque una mano en el medio del fregadero y extindala con la [place]. Haz ambos brazos. Luego coloque una mano y mueva las tazas con la otra. 5. Mirando por encima de los hombros: Con el mismo peso en cada pierna: gire alternativamente para mirar por encima de los hombros con una mano apoyada en el mostrador segn sea necesario. Comience con movimientos de la [place] solo para mirar delante del hombro, luego incluso con el hombro y avance hasta mirar detrs de usted. Para mirar hacia un lado, mueva la cabeza/los ojos, luego el hombro del costado que mira [place], cambie ms peso hacia el costado y tire de la cadera hacia atrs. Coloca una mano en el medio del fregadero y suelta la otra para que tu hombro pueda retroceder. Cambia de mano para Pension scheme manager otro  lado. Luego coloque una mano y [place] las tazas con la otra. 6. Al pisar con una pierna que no est amputada: retire de su camino los artculos que se encuentran debajo del gabinete. Mueva las caderas/pelvis para que el peso recaiga sobre la prtesis. PASE [place] la otra pierna de modo que la parte delantera del pie quede dentro del gabinete. Luego retroceda al piso.     Walking Program Using RW  (Por favor use su andador.): [employer] caminar durante un cierto perodo de tiempo cada da                          Camine 10 minutos 1 vez al da.             Aumentar 2 minutos cada 7 das              Trabaje hasta 20 minutos seguidos (1 vez al da).               Ejemplo:                         Da 1-2           4-5 minutos     3 veces al da                         Da 7-8           10-12 minutos 2-3 veces al da                         Da 13-14       20-22 minutos 1-2 veces al da  ASSESSMENT:  CLINICAL IMPRESSION: Focus of skilled session on  ongoing attempts to improve weight bearing and symmetry with utilization of MPK componentry.  He continues to rely on sound side with transfers and during ambulatory tasks. His swing through pattern appears more fluid today though he does still have moments of circumduction particularly with accelerated gait.  Terminal stance appears to be improving though this therapist is curious if his prosthetic may still be too long vs if he is seating well in current socket.  Will continue to assess progression of mechanics before making recommendation to [facility] and patient.  Continue per POC.   OBJECTIVE IMPAIRMENTS: Abnormal gait, decreased activity tolerance, decreased balance, decreased coordination, decreased knowledge of use of DME, difficulty walking, improper body mechanics, and prosthetic dependency .   ACTIVITY LIMITATIONS: carrying, lifting, bending, standing, squatting, stairs, transfers, and locomotion level  PARTICIPATION LIMITATIONS: meal prep, cleaning, community activity, occupation, and yard work  PERSONAL FACTORS: Fitness, Past/current experiences, and 3+ comorbidities: see PMH are also affecting patient's functional outcome.   REHAB POTENTIAL: Good  CLINICAL DECISION MAKING: Evolving/moderate complexity  EVALUATION COMPLEXITY: Moderate   GOALS: Goals reviewed with patient? Yes  STG = LTG due to POC Length    LONG TERM GOALS:  Target date: 08/07/2023  Pt will improve 5 x STS to less than or equal to 15 seconds w/proper body mechanics to demonstrate improved functional strength and transfer efficiency.   Baseline: 16.03s w/LLE in kickstand, retropulsion and poor eccentric control  Goal status: INITIAL  2.  Pt will report no falls w/microprocessor knee since eval for improved independence and reduced fall risk  Baseline: >10 falls  Goal status: INITIAL  3.  Pt will improve gait velocity to at  least 3.0 ft/s w/LRAD mod I for improved gait efficiency and functional use of LLE  prosthetic    Baseline: 2.87 ft/s w/axillary crutch  Goal status: INITIAL    PLAN:  PT FREQUENCY: 1-2x/week  PT DURATION: 4 weeks with POC written out longer as patient needs to see [facility] prior to scheduling visits   PLANNED INTERVENTIONS: [id number]- PT Re-evaluation, 97750- Physical Performance Testing, 97110-Therapeutic exercises, 97530- Therapeutic activity, [id number]- Neuromuscular re-education, 97535- Self Care, [id number]- Gait training, [phone]- Orthotic/Prosthetic subsequent, Patient/Family education, Balance training, Stair training, Dry Needling, DME instructions, and Gait training  PLAN FOR NEXT SESSION: Monitor BP. Review/update HEP prn. Facilitation of L knee flexion in weightbearing (ramps/curbs), sit to stands; stair training, terminal stance knee flexion. Progression of gait training to no AD overground, turns!  Assess LTGs!  When re-certing:  pt is going to [place] from June 1-15.  1x/wk for 8 wks!  Earlean Glaze, PT, DPT 08/05/2023, 3:52 PM

## 2023-08-06 ENCOUNTER — Ambulatory Visit: Admitting: Physical Therapy

## 2023-08-06 VITALS — BP 166/77 | HR 67

## 2023-08-06 DIAGNOSIS — M6281 Muscle weakness (generalized): Secondary | ICD-10-CM | POA: Diagnosis not present

## 2023-08-06 DIAGNOSIS — R2681 Unsteadiness on feet: Secondary | ICD-10-CM | POA: Diagnosis not present

## 2023-08-06 DIAGNOSIS — Z9181 History of falling: Secondary | ICD-10-CM

## 2023-08-06 DIAGNOSIS — R2689 Other abnormalities of gait and mobility: Secondary | ICD-10-CM

## 2023-08-06 NOTE — Therapy (Signed)
 OUTPATIENT PHYSICAL THERAPY PROSTHETIC TREATMENT - RECERTIFICATION    Patient Name: Tim Vincent MRN: [id number] DOB:07-25-1955, 68 y.o., male Today's Date: 08/06/2023  PCP: Nita Bast, NP  REFERRING PROVIDER: Gearldean Keepers, MD  END OF SESSION:  PT End of Session - 08/06/23 0815     Visit Number 8    Number of Visits 17   Recert   Date for PT Re-Evaluation 10/15/23   recert - to allow for pt to be in [place] for 2 weeks   Authorization Type [employer] Dual Complete    PT Start Time [phone]   Pt arrived late   PT Stop Time 0842    PT Time Calculation (min) 28 min    Equipment Utilized During Treatment Gait belt   L AKA w/microprocessor knee   Activity Tolerance Patient tolerated treatment well    Behavior During Therapy WFL for tasks assessed/performed             Past Medical History:  Diagnosis Date   Cancer (HCC)    prostate ca   Constipation    Diabetes mellitus without complication (HCC)    type 2   Gout    HLD (hyperlipidemia)    Hypertension    Past Surgical History:  Procedure Laterality Date   AMPUTATION Left 04/17/2022   Procedure: LEFT ABOVE KNEE AMPUTATION;  Surgeon: Timothy Ford, MD;  Location: [facility] OR;  Service: Orthopedics;  Laterality: Left;   MASS EXCISION Left 04/17/2021   Procedure: EXCISION LEFT ELBOW MASS;  Surgeon: Barb Bonito, MD;  Location: [facility];  Service: Plastics;  Laterality: Left;  1 hour   MASS EXCISION Left 08/10/2021   Procedure: EXCISION ELBOW MASS;  Surgeon: Barb Bonito, MD;  Location: [facility];  Service: Plastics;  Laterality: Left;  1 hour   MASS EXCISION Right 10/16/2022   Procedure: EXCISION OF MASS RIGHT ELBOW AND ARM;  Surgeon: Teretha Ferguson, MD;  Location: [facility] OR;  Service: Plastics;  Laterality: Right;  1.5 hours   Patient Active Problem List   Diagnosis Date Noted   Diabetic infection of left foot (HCC) 04/17/2022   Necrotizing fasciitis of lower leg (HCC) 04/17/2022    Dyslipidemia 04/17/2022   Essential hypertension 01/12/2021   Obesity 01/12/2021   Skin sensation disturbance 01/12/2021   Type 2 diabetes mellitus without complication (HCC) 01/12/2021   Diabetic neuropathy (HCC) 01/12/2021   Hav (hallux abducto valgus), unspecified laterality 01/12/2021   Cough 10/31/2017   Elevated blood pressure reading 10/31/2017    ONSET DATE: 03/20/2023 MD referral to PT  REFERRING DIAG: J19.147 (ICD-10-CM) - Left above-knee amputee   THERAPY DIAG:  Other abnormalities of gait and mobility  Muscle weakness (generalized)  History of falling  Rationale for Evaluation and Treatment: Rehabilitation  SUBJECTIVE:   SUBJECTIVE STATEMENT: Pt presents w/SPC and daughter. Denies falls and pain today.    Pt accompanied by: family member daughter interprets for session and is [facility] certified to do so, patient denies another interpreter   PERTINENT HISTORY: DM, neuropathy, HTN, left AKA 04/17/22, left elbow mass excision 04/17/21, 08/10/21 & 10/16/22, prostate CA, gout  PAIN:  Are you having pain? No  PRECAUTIONS: None  WEIGHT BEARING RESTRICTIONS: No  FALLS: Has patient fallen in last 6 months? Yes. 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  He lifts up to 40-50#.   PLOF: Independent, Independent with household mobility without device, and Independent with community mobility without device  PATIENT GOALS:  "walk without any support"   OBJECTIVE:   COGNITION: Overall cognitive status: Within functional limits for tasks assessed  POSTURE: rounded shoulders, forward head, and flexed trunk   BED MOBILITY:   Independent all aspects and no changes reported   TRANSFERS: Sit to stand: Modified independence with armrests and able to stabilize Stand to sit: Modified independence using armrests to control descent.    VITALS: LUE before interventions Vitals:   08/06/23 0817 08/06/23 0819  BP: (!) 177/79 (!) 166/77  Pulse: 70 67     GAIT: Gait pattern: step through pattern, decreased step length- Right, decreased stance time- Left, circumduction- Left, Left hip hike, lateral hip instability, lateral lean- Right, and trunk flexed Distance walked: Various clinic distances  Assistive device utilized: Transfemoral Prosthesis and tripod cane Level of assistance: Modified independence   CURRENT PROSTHETIC WEAR ASSESSMENT: Patient is independent with: skin check, residual limb care, prosthetic cleaning, ply sock cleaning, and correct ply sock adjustment Donning prosthesis: Modified independence Doffing prosthesis: Modified independence Prosthetic wear tolerance: pt reports daily for most of awake hours every day Prosthetic weight bearing tolerance: No reported changes Edema: To be assessed  Residual limb condition: not assessed  Prosthetic description: Ischial Containment Socket with flexible inner socket, silicon liner with suction ring suspension, microprocessor knee K code/activity level with prosthetic use: Level 3   TREATMENT:  Self-care/home management  Assessed vitals (see above) and systolic BP elevated. Pt reports he is stressed due to being late for session. Reassessed after several minutes and systolic BP did reduce some, but still elevated. However, within limits for session  Pt reports his socket is too big on his leg and he would like a new one. Informed pt he needs to be adding socks, as he currently only wears 1ply, and to contact [facility] if he is having socket issues. Pt verbalized understanding.   Physical Performance - LTG assessment   [facility] PT Assessment - 08/06/23 0823        Transfers   Five time sit to stand comments  15.32s   No UE support. LLE in kickstand w/weight shift to R and bracing against mat w/RLE     Ambulation/Gait   Gait velocity 32.8' over 13.63s = 2.41 ft/s w/tripod cane   32.8' over 14.72s = 2.23 ft/s no AD             NMR  Added to HEP (see bolded below) to work on turns. Performed quarter turns in // bars w/CGA as pt w/several instances of LOB in posterior direction w/activity. Informed pt to perform at countertop at home, pt verbalized understanding.   PATIENT EDUCATION: Education details: Reminder of plan for re-cert.  Additions to HEP. Importance of wearing socks and contact [facility] if he is having issues with socket  Person educated: Patient and Child(ren) Education method: Explanation, demonstration, verbal cues and handouts Education comprehension: verbalized understanding, returned demonstration, verbal cues required, needs further education      HOME EXERCISE PROGRAM: Access Code: [email or web address] URL: [email or web address] Date: 07/16/2023 Prepared by: Burleigh Carp Jamontae Thwaites  Exercises - Staggered Sit-to-Stand  - 1 x daily - 7 x weekly - 3 sets - 10 reps -ECCENTRIC focus of STS  in sturdy chair riding MPK component symmetrically as practiced 5/15 -Reaching to chair back or seat flexing the MPK for weight bearing -Stride weight shifts -Walking unsupported beside countertop - Standing Quarter Turn with Counter Support  - 1 x daily - 7 x weekly - 3 sets - 10 reps   AMPUTEE SINK HEP (Provided to pt and daughter in [place] and spanish 5/16) Haz cada ejercicio 1-2 veces al da. Haz cada ejercicio 10 repeticiones. Mantenga cada ejercicio durante 2 segundos para sentir su ubicacin.   EN EL FREGADERO ENCUENTRE SU POSICIN EN LA LNEA MEDIA Y COLOQUE LOS PIES A LA IGUAL DISTANCIA DE LA LNEA MEDIA. Trate de encontrar esta posicin cuando est quieto para Arts development officer.   UTILICE CINTA EN EL PISO PARA MARCAR LA POSICIN  DE LA LNEA MEDIA. Tambin debe intentar sentir con la extremidad la presin en la cavidad. Ests tratando de sentir con las extremidades lo que solas sentir con la planta del pie.   1. Desplazamiento de lado a lado: moviendo solo las caderas (no los hombros): [employer] el peso hacia la pierna izquierda, MANTENER/SENTIR. Vuelva a colocar el mismo peso en cada pierna, MANTENER/SENTIR. Mueva el peso sobre su pierna derecha, MANTENER/SENTIR. Vuelva a colocar el mismo peso en cada pierna, MANTENER/SENTIR. Repetir. Comience con ambas manos en el lavabo, avance solo hacia la derecha y luego sin manos. 2. Cambio de adelante hacia atrs: moviendo solo las caderas (no los hombros): [employer] el peso hacia adelante [employer] dedos de los pies, MANTENER/SENTIR. Mueva su peso hacia atrs para igualar el pie plano en ambas piernas, MANTENER/SENTIR. Mueva su peso nuevamente sobre sus talones, MANTENER/SENTIR. Mueva su peso hacia atrs para igualarlo en ambas piernas, MANTENER/SENTIR. Repetir. comience con ambas manos en el fregadero, avance solo [employer] mano derecha, luego sin [place]. 3. Conos/copas en movimiento: Con el mismo peso en cada pierna: Sujtese con una mano la primera vez, luego avance hasta no apoyar las manos. Mueva las tazas de un lado del fregadero al otro. Coloque los vasos a ~2" fuera de su alcance, avance hasta 10" ms all de su alcance. Coloque una mano en el medio del fregadero y extienda la mano con la otra. Haz ambos brazos. Luego coloque una mano y mueva las tazas con la otra. 4. Alcanzar hacia arriba/hacia Cottie Diss: se alterna el alcance hasta los gabinetes superiores o el techo si no hay gabinetes presentes. Mantenga el mismo peso en cada pierna. Comience con Natha Bair apoyada en el mostrador mientras la otra mano se extiende y avance hasta no apoyar la mano al Barista. Coloque una mano en el medio del fregadero y extindala con la [place]. Haz ambos brazos. Luego coloque una mano y mueva las tazas con  la otra. 5. Mirando por encima de los hombros: Con el mismo peso en cada pierna: gire alternativamente para mirar por encima de los hombros con una mano apoyada en el mostrador segn sea necesario. Comience con movimientos de la [place] solo para mirar delante del hombro, luego incluso con el hombro y avance hasta mirar detrs de usted. Para mirar hacia un lado, mueva la cabeza/los ojos, luego el hombro del costado que mira [place], cambie ms peso hacia el costado y tire de la cadera hacia atrs. Coloca una mano en el medio del fregadero y suelta la otra para que tu hombro pueda retroceder. Cambia de mano para mirar hacia otro lado. Luego coloque una mano y mueva las tazas con la otra. 6. Al pisar con  una pierna que no est amputada: retire de su camino los artculos que se encuentran debajo del gabinete. Mueva las caderas/pelvis para que el peso recaiga sobre la prtesis. PASE [place]ENT[place] la otra pierna de modo que la parte delantera del pie quede dentro del gabinete. Luego retroceda al piso.     Walking Program Using RW  (Por favor use su andador.): [employer] caminar durante un cierto perodo de tiempo cada da                          Camine 10 minutos 1 vez al da.             Aumentar 2 minutos cada 7 das              Trabaje hasta 20 minutos seguidos (1 vez al da).               Ejemplo:                         Da 1-2           4-5 minutos     3 veces al da                         Da 7-8           10-12 minutos 2-3 veces al da                         Da 13-14       20-22 minutos 1-2 veces al da  ASSESSMENT:  CLINICAL IMPRESSION: Session limited due to pt's late arrival and elevated BP. Emphasis of skilled PT session on LTG assessment, updating HEP to work on turns and writing new goals for extended POC. Pt did not meet any LTGs, demonstrating decreased gait speed this date both with and without AD, having single fall at [facility] a few weeks ago and exhibiting improper body mechanics  w/sit to stands. Pt continues to demonstrate decreased weight acceptance on LLE w/circumduction gait rather than knee flexion, but has improved since eval. Added quarter turns to HEP prior to pt's trip to [place] and will plan to recert at a frequency of 1x/week for 8 weeks upon his return,  Continue per POC.   OBJECTIVE IMPAIRMENTS: Abnormal gait, decreased activity tolerance, decreased balance, decreased coordination, decreased knowledge of use of DME, difficulty walking, improper body mechanics, and prosthetic dependency .   ACTIVITY LIMITATIONS: carrying, lifting, bending, standing, squatting, stairs, transfers, and locomotion level  PARTICIPATION LIMITATIONS: meal prep, cleaning, community activity, occupation, and yard work  PERSONAL FACTORS: Fitness, Past/current experiences, and 3+ comorbidities: see PMH are also affecting patient's functional outcome.   REHAB POTENTIAL: Good  CLINICAL DECISION MAKING: Evolving/moderate complexity  EVALUATION COMPLEXITY: Moderate   GOALS: Goals reviewed with patient? Yes  STG = LTG due to POC Length    LONG TERM GOALS:  Target date: 08/07/2023  Pt will improve 5 x STS to less than or equal to 15 seconds w/proper body mechanics to demonstrate improved functional strength and transfer efficiency.   Baseline: 16.03s w/LLE in kickstand, retropulsion and poor eccentric control; 15.32s w/LLE still in kickstand and bracing against mat w/RLE  Goal status: NOT MET  2.  Pt will report no falls w/microprocessor knee since eval for improved independence and reduced fall risk  Baseline: >10 falls; pt had fall  at [facility]  Goal status: NOT MET  3.  Pt will improve gait velocity to at least 3.0 ft/s w/LRAD mod I for improved gait efficiency and functional use of LLE prosthetic    Baseline: 2.87 ft/s w/axillary crutch; 2.41 ft/s w/cane (5/28) Goal status: NOT MET  NEW SHORT TERM GOALS:   Target date: 09/17/2023  Pt will improve gait velocity to at least  2.7 ft/s w/LRAD for improved gait efficiency and independence   Baseline: 2.41 ft/s w/tripod cane Goal status: INITIAL  2.  Pt will perform quarter turns w/no UE support and SBA for improved safety w/mobility  Baseline: CGA Goal status: INITIAL  3.  Berg to be assessed and LTG updated  Baseline:  Goal status: INITIAL    NEW LONG TERM GOALS:  Target date: 10/15/2023  Pt will improve 5 x STS to less than or equal to 15 seconds w/proper body mechanics to demonstrate improved functional strength and transfer efficiency. Baseline: 16.03s w/LLE in kickstand, retropulsion and poor eccentric control; 15.32s w/LLE still in kickstand and bracing against mat w/RLE (5/28) Goal status: INITIAL  2.  Pt will improve gait velocity to at least 3.0 ft/s w/LRAD for improved gait efficiency and independence   Baseline:  Goal status: INITIAL  3.  Berg goal  Baseline:  Goal status: INITIAL    PLAN:  PT FREQUENCY: 1-2x/week  PT DURATION:10 weeks (to allow for pt to be out of country)   PLANNED INTERVENTIONS: 16109- PT Re-evaluation, 97750- Physical Performance Testing, 97110-Therapeutic exercises, 97530- Therapeutic activity, [id number]- Neuromuscular re-education, [phone]- Self Care, [id number]- Gait training, [phone]- Orthotic/Prosthetic subsequent, Patient/Family education, Balance training, Stair training, Dry Needling, DME instructions, and Gait training  PLAN FOR NEXT SESSION: Monitor BP. Review/update HEP prn. Facilitation of L knee flexion in weightbearing (ramps/curbs), sit to stands; stair training, terminal stance knee flexion. Progression of gait training to no AD overground, turns!   When re-certing:  pt is going to [place] from June 1-15.  1x/wk for 8 wks!  Darletta Noblett E Kaiesha Tonner, PT, DPT 08/06/2023, 8:43 AM

## 2023-08-07 ENCOUNTER — Ambulatory Visit: Admitting: Physical Therapy

## 2023-08-12 ENCOUNTER — Ambulatory Visit: Admitting: Physical Therapy

## 2023-08-14 ENCOUNTER — Ambulatory Visit: Admitting: Physical Therapy

## 2023-08-28 ENCOUNTER — Ambulatory Visit: Attending: Orthopedic Surgery | Admitting: Physical Therapy

## 2023-08-28 VITALS — BP 156/73 | HR 66

## 2023-08-28 DIAGNOSIS — Z9181 History of falling: Secondary | ICD-10-CM | POA: Diagnosis not present

## 2023-08-28 DIAGNOSIS — R2681 Unsteadiness on feet: Secondary | ICD-10-CM | POA: Insufficient documentation

## 2023-08-28 DIAGNOSIS — R2689 Other abnormalities of gait and mobility: Secondary | ICD-10-CM | POA: Insufficient documentation

## 2023-08-28 DIAGNOSIS — M6281 Muscle weakness (generalized): Secondary | ICD-10-CM | POA: Insufficient documentation

## 2023-08-28 NOTE — Therapy (Signed)
 OUTPATIENT PHYSICAL THERAPY PROSTHETIC TREATMENT    Patient Name: Tim Vincent MRN: [id number] DOB:27-Jan-1956, 68 y.o., male Today's Date: 08/29/2023  PCP: Nita Bast, NP  REFERRING PROVIDER: Gearldean Keepers, MD  END OF SESSION:  PT End of Session - 08/28/23 1535     Visit Number 9    Number of Visits 17   Recert   Date for PT Re-Evaluation 10/15/23   recert - to allow for pt to be in [place] for 2 weeks   Authorization Type [employer] Dual Complete    PT Start Time 1532    PT Stop Time 1616    PT Time Calculation (min) 44 min    Equipment Utilized During Treatment Gait belt   L AKA w/microprocessor knee   Activity Tolerance Patient tolerated treatment well    Behavior During Therapy WFL for tasks assessed/performed          Past Medical History:  Diagnosis Date   Cancer (HCC)    prostate ca   Constipation    Diabetes mellitus without complication (HCC)    type 2   Gout    HLD (hyperlipidemia)    Hypertension    Past Surgical History:  Procedure Laterality Date   AMPUTATION Left 04/17/2022   Procedure: LEFT ABOVE KNEE AMPUTATION;  Surgeon: Timothy Ford, MD;  Location: [facility];  Service: Orthopedics;  Laterality: Left;   MASS EXCISION Left 04/17/2021   Procedure: EXCISION LEFT ELBOW MASS;  Surgeon: Barb Bonito, MD;  Location: [facility];  Service: Plastics;  Laterality: Left;  1 hour   MASS EXCISION Left 08/10/2021   Procedure: EXCISION ELBOW MASS;  Surgeon: Barb Bonito, MD;  Location: [facility];  Service: Plastics;  Laterality: Left;  1 hour   MASS EXCISION Right 10/16/2022   Procedure: EXCISION OF MASS RIGHT ELBOW AND ARM;  Surgeon: Teretha Ferguson, MD;  Location: [facility];  Service: Plastics;  Laterality: Right;  1.5 hours   Patient Active Problem List   Diagnosis Date Noted   Diabetic infection of left foot (HCC) 04/17/2022   Necrotizing fasciitis of lower leg (HCC) 04/17/2022   Dyslipidemia 04/17/2022   Essential hypertension  01/12/2021   Obesity 01/12/2021   Skin sensation disturbance 01/12/2021   Type 2 diabetes mellitus without complication (HCC) 01/12/2021   Diabetic neuropathy (HCC) 01/12/2021   Hav (hallux abducto valgus), unspecified laterality 01/12/2021   Cough 10/31/2017   Elevated blood pressure reading 10/31/2017    ONSET DATE: 03/20/2023 MD referral to PT  REFERRING DIAG: W10.272 (ICD-10-CM) - Left above-knee amputee   THERAPY DIAG:  Other abnormalities of gait and mobility  Muscle weakness (generalized)  Unsteadiness on feet  Rationale for Evaluation and Treatment: Rehabilitation  SUBJECTIVE:   SUBJECTIVE STATEMENT: Pt presents w/SPC and daughter. Denies falls and pain today. Has appointment with [facility] tomorrow to get some padding in socket. Wearing 5 ply on LLE but reports leg is still rotating on him, especially when he gets hot and sweaty.    Pt accompanied by: family member daughter interprets for session and is [facility] certified to do so, patient denies another interpreter   PERTINENT HISTORY: DM, neuropathy, HTN, left AKA 04/17/22, left elbow mass excision 04/17/21, 08/10/21 & 10/16/22, prostate CA, gout  PAIN:  Are you having pain? No  PRECAUTIONS: None  WEIGHT BEARING RESTRICTIONS: No  FALLS: Has patient fallen in last 6 months? Yes. 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  He lifts up to 40-50#.   PLOF: Independent, Independent with household mobility without device, and Independent with community mobility without device  PATIENT GOALS:  walk without any support   OBJECTIVE:   COGNITION: Overall cognitive status: Within  functional limits for tasks assessed  POSTURE: rounded shoulders, forward head, and flexed trunk   BED MOBILITY:  Independent all aspects and no changes reported   TRANSFERS: Sit to stand: Modified independence with armrests and able to stabilize Stand to sit: Modified independence using armrests to control descent.    VITALS: LUE before interventions Vitals:   08/28/23 1538  BP: (!) 156/73  Pulse: 66      GAIT: Gait pattern: step through pattern, decreased step length- Right, decreased stance time- Left, circumduction- Left, Left hip hike, lateral hip instability, lateral lean- Right, and trunk flexed Distance walked: Various clinic distances  Assistive device utilized: Transfemoral Prosthesis and tripod cane Level of assistance: Modified independence   CURRENT PROSTHETIC WEAR ASSESSMENT: Patient is independent with: skin check, residual limb care, prosthetic cleaning, ply sock cleaning, and correct ply sock adjustment Donning prosthesis: Modified independence Doffing prosthesis: Modified independence Prosthetic wear tolerance: pt reports daily for most of awake hours every day Prosthetic weight bearing tolerance: No reported changes Edema: To be assessed  Residual limb condition: not assessed  Prosthetic description: Ischial Containment Socket with flexible inner socket, silicon liner with suction ring suspension, microprocessor knee K code/activity level with prosthetic use: Level 3   TREATMENT:  Self-care/home management  Assessed vitals (see above) and WNL  Discussed importance of adding socks during the day as he feels his leg reducing in size. Pt reports he has been putting single sock on in the morning and wearing it all day, but verbalized understanding on need to add more layers as the day progresses.   NMR  In // bars, 6 Blaze pods on random reach setting for improved LE coordination, turns and single leg stability.  Performed on 2 minute minute intervals with  30-60s rest periods.  Pt requires SBA guarding. Round 1:  pods placed in circle inside // bars.  17 hits w/frequent UE support on bars to stabilize. Round 2:  same setup.  22 hits w/cues to reduce UE support. Round 3:  same setup.  25 hits. Almost fall at end of activity due to catching LLE  Notable errors/deficits:  pt frequently bumping into bars to stabilize due to posterior instability w/turns  Walking around gym x345' w/random direction changes to work on turns, side stepping and reactive balance strategies. Performed w/SPC and CGA-min A due to posterior instability. Pt most challenged by turns to R side.  Weaving through 4 cones x3 reps w/SPC to continue working on turns and LE coordination. Noted excessive ER of LLE w/activity, causing pt to lose balance so DC due to safety.  Standing deadlifts to 14 box using 12# KB, x15 reps, for facilitation of hip hinge and hip extension strength. Pt performed well w/good weight acceptance on LLE throughout.  Sit to stands w/12# KB held in goblet position, x15 reps, for improved functional BLE strength. Pt initially maintaining equal weight shift on BLEs, but noted ER of LLE w/increased reps, resulting in pt  shifting weight to R side.     PATIENT EDUCATION: Education details: Continue HEP, importance of adding more socks during the day  Person educated: Patient and Child(ren) Education method: Explanation, demonstration, verbal cues and handouts Education comprehension: verbalized understanding, returned demonstration, verbal cues required, needs further education      HOME EXERCISE PROGRAM: Access Code: [email or web address] URL: [email or web address] Date: 07/16/2023 Prepared by: Burleigh Carp Vershawn Westrup  Exercises - Staggered Sit-to-Stand  - 1 x daily - 7 x weekly - 3 sets - 10 reps -ECCENTRIC focus of STS in sturdy chair riding MPK component symmetrically as practiced 5/15 -Reaching to chair back or seat flexing the MPK for weight bearing -Stride  weight shifts -Walking unsupported beside countertop - Standing Quarter Turn with Counter Support  - 1 x daily - 7 x weekly - 3 sets - 10 reps   AMPUTEE SINK HEP (Provided to pt and daughter in [place] and spanish 5/16) Haz cada ejercicio 1-2 veces al da. Haz cada ejercicio 10 repeticiones. Mantenga cada ejercicio durante 2 segundos para sentir su ubicacin.   EN EL FREGADERO ENCUENTRE SU POSICIN EN LA LNEA MEDIA Y COLOQUE LOS PIES A LA IGUAL DISTANCIA DE LA LNEA MEDIA. Trate de encontrar esta posicin cuando est quieto para Arts development officer.   UTILICE CINTA EN EL PISO PARA MARCAR LA POSICIN DE LA LNEA MEDIA. Tambin debe intentar sentir con la extremidad la presin en la cavidad. Ests tratando de sentir con las extremidades lo que solas sentir con la planta del pie.   1. Desplazamiento de lado a lado: moviendo solo las caderas (no los hombros): [employer] el peso hacia la pierna izquierda, MANTENER/SENTIR. Vuelva a colocar el mismo peso en cada pierna, MANTENER/SENTIR. Mueva el peso sobre su pierna derecha, MANTENER/SENTIR. Vuelva a colocar el mismo peso en cada pierna, MANTENER/SENTIR. Repetir. Comience con ambas manos en el lavabo, avance solo hacia la derecha y luego sin manos. 2. Cambio de adelante hacia atrs: moviendo solo las caderas (no los hombros): [employer] el peso hacia adelante [employer] dedos de los pies, MANTENER/SENTIR. Mueva su peso hacia atrs para igualar el pie plano en ambas piernas, MANTENER/SENTIR. Mueva su peso nuevamente sobre sus talones, MANTENER/SENTIR. Mueva su peso hacia atrs para igualarlo en ambas piernas, MANTENER/SENTIR. Repetir. comience con ambas manos en el fregadero, avance solo [employer] mano derecha, luego sin [place]. 3. Conos/copas en movimiento: Con el mismo peso en cada pierna: Sujtese con una mano la primera vez, luego avance hasta no apoyar las manos. Mueva las tazas de un lado del fregadero al otro. Coloque los vasos a ~2" fuera de su alcance, avance  hasta 10" ms all de su alcance. Coloque una mano en el medio del fregadero y extienda la mano con la otra. Haz ambos brazos. Luego coloque una mano y mueva las tazas con la otra. 4. Alcanzar hacia arriba/hacia Cottie Diss: se alterna el alcance hasta los gabinetes superiores o el techo si no hay gabinetes presentes. Mantenga el mismo peso en cada pierna. Comience con Natha Bair apoyada en el mostrador mientras la otra mano se extiende y avance hasta no apoyar la mano al Barista. Coloque una mano en el medio del fregadero y extindala con la [place]. Haz ambos brazos. Luego coloque una mano y mueva las tazas con la otra. 5. Mirando por encima de los hombros: Con el mismo peso en cada pierna: gire alternativamente para mirar por encima de los hombros con una mano apoyada en el mostrador segn sea necesario. Comience con movimientos de  la [place] solo para Environmental consultant del hombro, luego incluso con el hombro y avance hasta mirar detrs de usted. Para mirar hacia un lado, mueva la cabeza/los ojos, luego el hombro del costado que mira Forbes, cambie ms peso hacia el costado y tire de la cadera hacia atrs. Coloca una mano en el medio del fregadero y suelta la otra para que tu hombro pueda retroceder. Cambia de mano para mirar hacia otro lado. Luego coloque una mano y mueva las tazas con la otra. 6. Al pisar con una pierna que no est amputada: retire de su camino los artculos que se encuentran debajo del gabinete. Mueva las caderas/pelvis para que el peso recaiga sobre la prtesis. PASE LENTAMENTE la otra pierna de modo que la parte delantera del pie quede dentro del gabinete. Luego retroceda al piso.     Walking Program Using RW  (Por favor use su andador.): [employer] caminar durante un cierto perodo de tiempo cada da                          Camine 10 minutos 1 vez al da.             Aumentar 2 minutos cada 7 das              Trabaje hasta 20 minutos seguidos (1 vez al da).               Ejemplo:                          Da 1-2           4-5 minutos     3 veces al da                         Da 7-8           10-12 minutos 2-3 veces al da                         Da 13-14       20-22 minutos 1-2 veces al da  ASSESSMENT:  CLINICAL IMPRESSION: Emphasis of skilled PT session on stability w/turns, LE coordination and single leg stability. Pt reports his L socket is rotating more on him but has not been adding more sock layers. Educated pt on adding more sock layers during the day to assist w/volume changes and pt to go to [facility] tomorrow to have padding added to socket. Pt demonstrates forward flexed posture w/gait and tendency to lose balance posteriorly during high-level activities. Pt most challenged w/turns to R side but is bearing more weight thorough LLE w/transfers and lifting activities. Continue per POC.   OBJECTIVE IMPAIRMENTS: Abnormal gait, decreased activity tolerance, decreased balance, decreased coordination, decreased knowledge of use of DME, difficulty walking, improper body mechanics, and prosthetic dependency .   ACTIVITY LIMITATIONS: carrying, lifting, bending, standing, squatting, stairs, transfers, and locomotion level  PARTICIPATION LIMITATIONS: meal prep, cleaning, community activity, occupation, and yard work  PERSONAL FACTORS: Fitness, Past/current experiences, and 3+ comorbidities: see PMH are also affecting patient's functional outcome.   REHAB POTENTIAL: Good  CLINICAL DECISION MAKING: Evolving/moderate complexity  EVALUATION COMPLEXITY: Moderate   GOALS: Goals reviewed with patient? Yes  STG = LTG due to POC Length    LONG TERM GOALS:  Target date: 08/07/2023  Pt will improve 5 x STS to  less than or equal to 15 seconds w/proper body mechanics to demonstrate improved functional strength and transfer efficiency.   Baseline: 16.03s w/LLE in kickstand, retropulsion and poor eccentric control; 15.32s w/LLE still in kickstand and bracing against mat w/RLE   Goal status: NOT MET  2.  Pt will report no falls w/microprocessor knee since eval for improved independence and reduced fall risk  Baseline: >10 falls; pt had fall at [facility]  Goal status: NOT MET  3.  Pt will improve gait velocity to at least 3.0 ft/s w/LRAD mod I for improved gait efficiency and functional use of LLE prosthetic    Baseline: 2.87 ft/s w/axillary crutch; 2.41 ft/s w/cane (5/28) Goal status: NOT MET  NEW SHORT TERM GOALS:   Target date: 09/17/2023  Pt will improve gait velocity to at least 2.7 ft/s w/LRAD for improved gait efficiency and independence   Baseline: 2.41 ft/s w/tripod cane Goal status: INITIAL  2.  Pt will perform quarter turns w/no UE support and SBA for improved safety w/mobility  Baseline: CGA Goal status: INITIAL  3.  Berg to be assessed and LTG updated  Baseline:  Goal status: INITIAL    NEW LONG TERM GOALS:  Target date: 10/15/2023  Pt will improve 5 x STS to less than or equal to 15 seconds w/proper body mechanics to demonstrate improved functional strength and transfer efficiency. Baseline: 16.03s w/LLE in kickstand, retropulsion and poor eccentric control; 15.32s w/LLE still in kickstand and bracing against mat w/RLE (5/28) Goal status: INITIAL  2.  Pt will improve gait velocity to at least 3.0 ft/s w/LRAD for improved gait efficiency and independence   Baseline:  Goal status: INITIAL  3.  Berg goal  Baseline:  Goal status: INITIAL    PLAN:  PT FREQUENCY: 1-2x/week  PT DURATION:10 weeks (to allow for [place] to be out of country)   PLANNED INTERVENTIONS: 16109- PT Re-evaluation, 97750- Physical Performance Testing, 97110-Therapeutic exercises, 97530- Therapeutic activity, [id number]- Neuromuscular re-education, [phone]- Self Care, 09811- Gait training, [phone]- Orthotic/Prosthetic subsequent, Patient/Family education, Balance training, Stair training, Dry Needling, DME instructions, and Gait training  PLAN FOR NEXT SESSION: 10th visit PN.  Monitor BP. Review/update HEP prn. Facilitation of L knee flexion in weightbearing (ramps/curbs), sit to stands; stair training, terminal stance knee flexion. Progression of gait training to no AD overground, turns!    Abbeygail Igoe E Johnmatthew Solorio, PT, DPT 08/29/2023, 8:34 AM

## 2023-09-03 ENCOUNTER — Ambulatory Visit: Admitting: Physical Therapy

## 2023-09-05 ENCOUNTER — Ambulatory Visit: Payer: Self-pay | Admitting: Physical Therapy

## 2023-09-05 VITALS — BP 162/64 | HR 68

## 2023-09-05 DIAGNOSIS — Z9181 History of falling: Secondary | ICD-10-CM | POA: Diagnosis not present

## 2023-09-05 DIAGNOSIS — R2689 Other abnormalities of gait and mobility: Secondary | ICD-10-CM

## 2023-09-05 DIAGNOSIS — R2681 Unsteadiness on feet: Secondary | ICD-10-CM | POA: Diagnosis not present

## 2023-09-05 DIAGNOSIS — M6281 Muscle weakness (generalized): Secondary | ICD-10-CM | POA: Diagnosis not present

## 2023-09-05 NOTE — Therapy (Signed)
 OUTPATIENT PHYSICAL THERAPY PROSTHETIC TREATMENT - 10TH VISIT PROGRESS NOTE    Patient Name: Tim Vincent MRN: [id number] DOB:1956-01-18, 68 y.o., male Today's Date: 09/05/2023  PCP: Celestia Harder, NP  REFERRING PROVIDER: Jerona Sage, MD  Physical Therapy Progress Note   Dates of Reporting Period:06/26/23 - 09/05/23  See Note below for Objective Data and Assessment of Progress/Goals.   END OF SESSION:  PT End of Session - 09/05/23 1323     Visit Number 10    Number of Visits 17   Recert   Date for PT Re-Evaluation 10/15/23   recert - to allow for pt to be in [place] for 2 weeks   Authorization Type [employer] Dual Complete    PT Start Time 1321    PT Stop Time 1402    PT Time Calculation (min) 41 min    Equipment Utilized During Treatment Gait belt   L AKA w/microprocessor knee   Activity Tolerance Patient tolerated treatment well    Behavior During Therapy WFL for tasks assessed/performed          Past Medical History:  Diagnosis Date   Cancer (HCC)    prostate ca   Constipation    Diabetes mellitus without complication (HCC)    type 2   Gout    HLD (hyperlipidemia)    Hypertension    Past Surgical History:  Procedure Laterality Date   AMPUTATION Left 04/17/2022   Procedure: LEFT ABOVE KNEE AMPUTATION;  Surgeon: [name], MD;  Location: [facility];  Service: Orthopedics;  Laterality: Left;   MASS EXCISION Left 04/17/2021   Procedure: EXCISION LEFT ELBOW MASS;  Surgeon: [name], MD;  Location: [facility];  Service: Plastics;  Laterality: Left;  1 hour   MASS EXCISION Left 08/10/2021   Procedure: EXCISION ELBOW MASS;  Surgeon: [name], MD;  Location: [facility];  Service: Plastics;  Laterality: Left;  1 hour   MASS EXCISION Right 10/16/2022   Procedure: EXCISION OF MASS RIGHT ELBOW AND ARM;  Surgeon: [name], MD;  Location: [facility];  Service: Plastics;  Laterality: Right;  1.5 hours   Patient Active Problem List    Diagnosis Date Noted   Diabetic infection of left foot (HCC) 04/17/2022   Necrotizing fasciitis of lower leg (HCC) 04/17/2022   Dyslipidemia 04/17/2022   Essential hypertension 01/12/2021   Obesity 01/12/2021   Skin sensation disturbance 01/12/2021   Type 2 diabetes mellitus without complication (HCC) 01/12/2021   Diabetic neuropathy (HCC) 01/12/2021   Hav (hallux abducto valgus), unspecified laterality 01/12/2021   Cough 10/31/2017   Elevated blood pressure reading 10/31/2017    ONSET DATE: 03/20/2023 MD referral to PT  REFERRING DIAG: S10.387 (ICD-10-CM) - Left above-knee amputee   THERAPY DIAG:  Other abnormalities of gait and mobility  Muscle weakness (generalized)  Unsteadiness on feet  History of falling  Rationale for Evaluation and Treatment: Rehabilitation  SUBJECTIVE:   SUBJECTIVE STATEMENT: Pt presents w/SPC and daughter. Reports he saw Catarina and she added some padding to his socket but informed him he is due for a new one if he would like one. Denies falls or pain today.    Pt accompanied by: family member daughter interprets for session and is [facility] certified to do so, patient denies another interpreter   PERTINENT HISTORY: DM, neuropathy, HTN, left AKA 04/17/22, left elbow mass excision 04/17/21, 08/10/21 & 10/16/22, prostate CA, gout  PAIN:  Are you having pain? No  PRECAUTIONS:  None  WEIGHT BEARING RESTRICTIONS: No  FALLS: Has patient fallen in last 6 months? Yes. 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  He lifts up to 40-50#.   PLOF: Independent, Independent with household mobility without device, and  Independent with community mobility without device  PATIENT GOALS:  walk without any support   OBJECTIVE:   COGNITION: Overall cognitive status: Within functional limits for tasks assessed  POSTURE: rounded shoulders, forward head, and flexed trunk   BED MOBILITY:  Independent all aspects and no changes reported   TRANSFERS: Sit to stand: Modified independence with armrests and able to stabilize Stand to sit: Modified independence using armrests to control descent.    VITALS: LUE before interventions Vitals:   09/05/23 1326  BP: (!) 162/64  Pulse: 68       GAIT: Gait pattern: step through pattern, decreased step length- Right, decreased stance time- Left, circumduction- Left, Left hip hike, lateral hip instability, lateral lean- Right, and trunk flexed Distance walked: Various clinic distances  Assistive device utilized: Transfemoral Prosthesis and tripod cane Level of assistance: Modified independence   CURRENT PROSTHETIC WEAR ASSESSMENT: Patient is independent with: skin check, residual limb care, prosthetic cleaning, ply sock cleaning, and correct ply sock adjustment Donning prosthesis: Modified independence Doffing prosthesis: Modified independence Prosthetic wear tolerance: pt reports daily for most of awake hours every day Prosthetic weight bearing tolerance: No reported changes Edema: To be assessed  Residual limb condition: not assessed  Prosthetic description: Ischial Containment Socket with flexible inner socket, silicon liner with suction ring suspension, microprocessor knee K code/activity level with prosthetic use: Level 3   TREATMENT:  Self-care/home management  Assessed vitals (see above) and systolic BP elevated but within limits for session  Recommended pt get fitted for new socket sooner than later as his socket continues to ER on him despite wearing socks. Recommended pt bring socks to next session as pt currently only wearing 1 ply and continues  to require education regarding proper sock management.   NMR  Walking down hallway w/180 degree turns without AD, x3 reps w/SBA-CGA. Pt initially demonstrating wide BOS and had single instance of anterior LOB due to ER of LLE. Min cues for hip-width BOS and to slow down and focus on facilitation of TO and knee flxexion of LLE, which pt able to perform well. Pt prefers to turn to R side despite being easier to turn to L   RAMP:  Level of Assistance: CGA and Min A Assistive device utilized: None Ramp Comments: Pt initially demonstrating circumduction of LLE to ascend ramp, resulting in anterior instability requiring min A to stabilize. Min cues for TO to facilitate L knee flexion and pt much more stable ascending ramp. Pt descends cautiously but no instability noted   CURB:  Level of Assistance: CGA Assistive device utilized: Hurrycane  Curb Comments: Pt w/good LE sequencing but poor eccentric control and frequent ER of LLE w/descent, so recommended pt use cane at all times if navigating curbs.   Clock yourself at 30 SPM for 2 minutes x2 rounds w/CGA-min A for improved lateral weight shifting, LE coordination and single leg stability. Pt most challenged w/posterior stepping w/a  few instances of anterior LOB, but was able to stabilize well.     PATIENT EDUCATION: Education details: Continue HEP, encouragement to be fitted for new socket.  Person educated: Patient and Child(ren) Education method: Explanation, demonstration, verbal cues and handouts Education comprehension: verbalized understanding, returned demonstration, verbal cues required, needs further education      HOME EXERCISE PROGRAM: Access Code: [email or web address] URL: [email or web address] Date: 07/16/2023 Prepared by: Marlon Courtnay Petrilla  Exercises - Staggered Sit-to-Stand  - 1 x daily - 7 x weekly - 3 sets - 10 reps -ECCENTRIC focus of STS in sturdy chair riding MPK component symmetrically as practiced 5/15 -Reaching to  chair back or seat flexing the MPK for weight bearing -Stride weight shifts -Walking unsupported beside countertop - Standing Quarter Turn with Counter Support  - 1 x daily - 7 x weekly - 3 sets - 10 reps   AMPUTEE SINK HEP (Provided to pt and daughter in [place] and spanish 5/16) Haz cada ejercicio 1-2 veces al da. Haz cada ejercicio 10 repeticiones. Mantenga cada ejercicio durante 2 segundos para sentir su ubicacin.   EN EL FREGADERO ENCUENTRE SU POSICIN EN LA LNEA MEDIA Y COLOQUE LOS PIES A LA IGUAL DISTANCIA DE LA LNEA MEDIA. Trate de encontrar esta posicin cuando est quieto para Arts development officer.   UTILICE CINTA EN EL PISO PARA MARCAR LA POSICIN DE LA LNEA MEDIA. Tambin debe intentar sentir con la extremidad la presin en la cavidad. Ests tratando de sentir con las extremidades lo que solas sentir con la planta del pie.   1. Desplazamiento de lado a lado: moviendo solo las caderas (no los hombros): [employer] el peso hacia la pierna izquierda, MANTENER/SENTIR. Vuelva a colocar el mismo peso en cada pierna, MANTENER/SENTIR. Mueva el peso sobre su pierna derecha, MANTENER/SENTIR. Vuelva a colocar el mismo peso en cada pierna, MANTENER/SENTIR. Repetir. Comience con ambas manos en el lavabo, avance solo hacia la derecha y luego sin manos. 2. Cambio de adelante hacia atrs: moviendo solo las caderas (no los hombros): [employer] el peso hacia adelante [employer] dedos de los pies, MANTENER/SENTIR. Mueva su peso hacia atrs para igualar el pie plano en ambas piernas, MANTENER/SENTIR. Mueva su peso nuevamente sobre sus talones, MANTENER/SENTIR. Mueva su peso hacia atrs para igualarlo en ambas piernas, MANTENER/SENTIR. Repetir. comience con ambas manos en el fregadero, avance solo [employer] mano derecha, luego sin [place]. 3. Conos/copas en movimiento: Con el mismo peso en cada pierna: Sujtese con una mano la primera vez, luego avance hasta no apoyar las manos. Mueva las tazas de un lado del  fregadero al otro. Coloque los vasos a ~2" fuera de su alcance, avance hasta 10" ms all de su alcance. Coloque una mano en el medio del fregadero y extienda la mano con la otra. Haz ambos brazos. Luego coloque una mano y mueva las tazas con la otra. 4. Alcanzar hacia arriba/hacia sheryll: se alterna el alcance hasta los gabinetes superiores o el techo si no hay gabinetes presentes. Mantenga el mismo peso en cada pierna. Comience con ignacia bass apoyada en el mostrador mientras la otra mano se extiende y avance hasta no apoyar la mano al [place]. Coloque una mano en el medio del fregadero y extindala con la [place]. Haz ambos brazos. Luego coloque una mano y mueva las tazas con la otra. 5. Mirando por encima de los hombros: Con el mismo peso en cada pierna: gire alternativamente para mirar por encima de los hombros con una mano apoyada en el mostrador segn sea  necesario. Comience con movimientos de la [place] solo para mirar delante del hombro, luego incluso con el hombro y avance hasta mirar detrs de usted. Para mirar hacia un lado, mueva la cabeza/los ojos, luego el hombro del costado que mira [place], cambie ms peso hacia el costado y tire de la cadera hacia atrs. Coloca una mano en el medio del fregadero y suelta la otra para que tu hombro pueda retroceder. Cambia de mano para mirar hacia otro lado. Luego coloque una mano y mueva las tazas con la otra. 6. Al pisar con una pierna que no est amputada: retire de su camino los artculos que se encuentran debajo del gabinete. Mueva las caderas/pelvis para que el peso recaiga sobre la prtesis. PASE LENTAMENTE la otra pierna de modo que la parte delantera del pie quede dentro del gabinete. Luego retroceda al piso.     Walking Program Using RW  (Por favor use su andador.): [employer] caminar durante un cierto perodo de tiempo cada da                          Camine 10 minutos 1 vez al da.             Aumentar 2 minutos cada 7 das               Trabaje hasta 20 minutos seguidos (1 vez al da).               Ejemplo:                         Da 1-2           4-5 minutos     3 veces al da                         Da 7-8           10-12 minutos 2-3 veces al da                         Da 13-14       20-22 minutos 1-2 veces al da  ASSESSMENT:  CLINICAL IMPRESSION: Emphasis of skilled PT session on gait training w/no AD on level and unlevel surfaces, single leg stability, LE coordination and stability w/turns. Pt continues to report L socket externally rotating on him when in full weightbearing despite wearing socks and having padding applied. Encouraged pt to be fitted for new socket, as he was informed he qualifies for one, and to bring socks to next session as he requires continued education on sock management. Pt most challenged w/ascending ramp this date due to L socket ER and tendency to circumduct leg rather than assume TO position to facilitate knee flexion, but did improve with practice. Pt reports he feels more confident on the leg know and is relying less on his cane, but continues to be unstable on unlevel surfaces and with turns. Continue per POC.   OBJECTIVE IMPAIRMENTS: Abnormal gait, decreased activity tolerance, decreased balance, decreased coordination, decreased knowledge of use of DME, difficulty walking, improper body mechanics, and prosthetic dependency .   ACTIVITY LIMITATIONS: carrying, lifting, bending, standing, squatting, stairs, transfers, and locomotion level  PARTICIPATION LIMITATIONS: meal prep, cleaning, community activity, occupation, and yard work  PERSONAL FACTORS: Fitness, Past/current experiences, and 3+ comorbidities: see PMH are also affecting patient's functional outcome.  REHAB POTENTIAL: Good  CLINICAL DECISION MAKING: Evolving/moderate complexity  EVALUATION COMPLEXITY: Moderate   GOALS: Goals reviewed with patient? Yes  STG = LTG due to POC Length    LONG TERM GOALS:  Target date:  08/07/2023  Pt will improve 5 x STS to less than or equal to 15 seconds w/proper body mechanics to demonstrate improved functional strength and transfer efficiency.   Baseline: 16.03s w/LLE in kickstand, retropulsion and poor eccentric control; 15.32s w/LLE still in kickstand and bracing against mat w/RLE  Goal status: NOT MET  2.  Pt will report no falls w/microprocessor knee since eval for improved independence and reduced fall risk  Baseline: >10 falls; pt had fall at [facility]  Goal status: NOT MET  3.  Pt will improve gait velocity to at least 3.0 ft/s w/LRAD mod I for improved gait efficiency and functional use of LLE prosthetic    Baseline: 2.87 ft/s w/axillary crutch; 2.41 ft/s w/cane (5/28) Goal status: NOT MET  NEW SHORT TERM GOALS:   Target date: 09/17/2023  Pt will improve gait velocity to at least 2.7 ft/s w/LRAD for improved gait efficiency and independence   Baseline: 2.41 ft/s w/tripod cane Goal status: INITIAL  2.  Pt will perform quarter turns w/no UE support and SBA for improved safety w/mobility  Baseline: CGA Goal status: INITIAL  3.  Berg to be assessed and LTG updated  Baseline:  Goal status: INITIAL    NEW LONG TERM GOALS:  Target date: 10/15/2023  Pt will improve 5 x STS to less than or equal to 15 seconds w/proper body mechanics to demonstrate improved functional strength and transfer efficiency. Baseline: 16.03s w/LLE in kickstand, retropulsion and poor eccentric control; 15.32s w/LLE still in kickstand and bracing against mat w/RLE (5/28) Goal status: INITIAL  2.  Pt will improve gait velocity to at least 3.0 ft/s w/LRAD for improved gait efficiency and independence   Baseline:  Goal status: INITIAL  3.  Berg goal  Baseline:  Goal status: INITIAL    PLAN:  PT FREQUENCY: 1-2x/week  PT DURATION:10 weeks (to allow for [place] of country)   PLANNED INTERVENTIONS: 02835- PT Re-evaluation, 97750- Physical Performance Testing,  97110-Therapeutic exercises, 97530- Therapeutic activity, [id number]- Neuromuscular re-education, [phone]- Self Care, [id number]- Gait training, [phone]- Orthotic/Prosthetic subsequent, Patient/Family education, Balance training, Stair training, Dry Needling, DME instructions, and Gait training  PLAN FOR NEXT SESSION: Monitor BP. Review/update HEP prn. Facilitation of L knee flexion in weightbearing (ramps/curbs), sit to stands; stair training, terminal stance knee flexion. Progression of gait training to no AD overground, turns!    Keilly Fatula E Epic Tribbett, PT, DPT 09/05/2023, 2:05 PM

## 2023-09-09 DIAGNOSIS — I10 Essential (primary) hypertension: Secondary | ICD-10-CM | POA: Diagnosis not present

## 2023-09-09 DIAGNOSIS — Z794 Long term (current) use of insulin: Secondary | ICD-10-CM | POA: Diagnosis not present

## 2023-09-09 DIAGNOSIS — S78119A Complete traumatic amputation at level between unspecified hip and knee, initial encounter: Secondary | ICD-10-CM | POA: Diagnosis not present

## 2023-09-09 DIAGNOSIS — E119 Type 2 diabetes mellitus without complications: Secondary | ICD-10-CM | POA: Diagnosis not present

## 2023-09-09 DIAGNOSIS — N184 Chronic kidney disease, stage 4 (severe): Secondary | ICD-10-CM | POA: Diagnosis not present

## 2023-09-09 DIAGNOSIS — R809 Proteinuria, unspecified: Secondary | ICD-10-CM | POA: Diagnosis not present

## 2023-09-10 ENCOUNTER — Ambulatory Visit: Attending: Orthopedic Surgery | Admitting: Physical Therapy

## 2023-09-10 ENCOUNTER — Encounter: Payer: Self-pay | Admitting: Physical Therapy

## 2023-09-10 VITALS — BP 179/73 | HR 59

## 2023-09-10 DIAGNOSIS — R2681 Unsteadiness on feet: Secondary | ICD-10-CM | POA: Insufficient documentation

## 2023-09-10 DIAGNOSIS — R2689 Other abnormalities of gait and mobility: Secondary | ICD-10-CM | POA: Insufficient documentation

## 2023-09-10 DIAGNOSIS — Z9181 History of falling: Secondary | ICD-10-CM | POA: Diagnosis not present

## 2023-09-10 DIAGNOSIS — M6281 Muscle weakness (generalized): Secondary | ICD-10-CM | POA: Diagnosis not present

## 2023-09-10 NOTE — Therapy (Signed)
 OUTPATIENT PHYSICAL THERAPY PROSTHETIC TREATMENT   Patient Name: Tim Vincent MRN: [id number] DOB:11-18-1955, 68 y.o., male Today's Date: 09/10/2023  PCP: Celestia Harder, NP  REFERRING PROVIDER: Jerona Sage, MD    END OF SESSION:  PT End of Session - 09/10/23 1547     Visit Number 11    Number of Visits 17   Recert   Date for PT Re-Evaluation 10/15/23   recert - to allow for pt to be in [place] for 2 weeks   Authorization Type [employer] Dual Complete    PT Start Time 1539    PT Stop Time 1627    PT Time Calculation (min) 48 min    Equipment Utilized During Treatment Gait belt   L AKA w/microprocessor knee   Activity Tolerance Patient tolerated treatment well    Behavior During Therapy WFL for tasks assessed/performed          Past Medical History:  Diagnosis Date   Cancer (HCC)    prostate ca   Constipation    Diabetes mellitus without complication (HCC)    type 2   Gout    HLD (hyperlipidemia)    Hypertension    Past Surgical History:  Procedure Laterality Date   AMPUTATION Left 04/17/2022   Procedure: LEFT ABOVE KNEE AMPUTATION;  Surgeon: [name], MD;  Location: [facility];  Service: Orthopedics;  Laterality: Left;   MASS EXCISION Left 04/17/2021   Procedure: EXCISION LEFT ELBOW MASS;  Surgeon: [name], MD;  Location: [facility];  Service: Plastics;  Laterality: Left;  1 hour   MASS EXCISION Left 08/10/2021   Procedure: EXCISION ELBOW MASS;  Surgeon: [name], MD;  Location: [facility];  Service: Plastics;  Laterality: Left;  1 hour   MASS EXCISION Right 10/16/2022   Procedure: EXCISION OF MASS RIGHT ELBOW AND ARM;  Surgeon: [name], MD;  Location: [facility];  Service: Plastics;  Laterality: Right;  1.5 hours   Patient Active Problem List   Diagnosis Date Noted   Diabetic infection of left foot (HCC) 04/17/2022   Necrotizing fasciitis of lower leg (HCC) 04/17/2022   Dyslipidemia 04/17/2022   Essential  hypertension 01/12/2021   Obesity 01/12/2021   Skin sensation disturbance 01/12/2021   Type 2 diabetes mellitus without complication (HCC) 01/12/2021   Diabetic neuropathy (HCC) 01/12/2021   Hav (hallux abducto valgus), unspecified laterality 01/12/2021   Cough 10/31/2017   Elevated blood pressure reading 10/31/2017    ONSET DATE: 03/20/2023 MD referral to PT  REFERRING DIAG: S10.387 (ICD-10-CM) - Left above-knee amputee   THERAPY DIAG:  Other abnormalities of gait and mobility  Muscle weakness (generalized)  Unsteadiness on feet  History of falling  Rationale for Evaluation and Treatment: Rehabilitation  SUBJECTIVE:   SUBJECTIVE STATEMENT: Pt presents w/SPC and daughter. Reports he has been traveling without issues. Denies falls or pain today.  Daughter inquires about if he should get a new socket.   Pt accompanied by: family member daughter interprets for session and is [facility] certified to do so, patient denies another interpreter   PERTINENT HISTORY: DM, neuropathy, HTN, left AKA 04/17/22, left elbow mass excision 04/17/21, 08/10/21 & 10/16/22, prostate CA, gout  PAIN:  Are you having pain? No  PRECAUTIONS: None  WEIGHT BEARING RESTRICTIONS: No  FALLS: Has patient fallen in last 6 months? Yes. 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  He lifts up to 40-50#.   PLOF: Independent, Independent with household mobility without device, and Independent with community mobility without device  PATIENT GOALS:  walk without any support   OBJECTIVE:   COGNITION: Overall cognitive status: Within functional limits for tasks  assessed  POSTURE: rounded shoulders, forward head, and flexed trunk   BED MOBILITY:  Independent all aspects and no changes reported   TRANSFERS: Sit to stand: Modified independence with armrests and able to stabilize Stand to sit: Modified independence using armrests to control descent.    VITALS: LUE before interventions Vitals:   09/10/23 1543  BP: (!) 179/73  Pulse: (!) 59       GAIT: Gait pattern: step through pattern, decreased step length- Right, decreased stance time- Left, circumduction- Left, Left hip hike, lateral hip instability, lateral lean- Right, and trunk flexed Distance walked: Various clinic distances  Assistive device utilized: Transfemoral Prosthesis and tripod cane Level of assistance: Modified independence   CURRENT PROSTHETIC WEAR ASSESSMENT: Patient is independent with: skin check, residual limb care, prosthetic cleaning, ply sock cleaning, and correct ply sock adjustment Donning prosthesis: Modified independence Doffing prosthesis: Modified independence Prosthetic wear tolerance: pt reports daily for most of awake hours every day Prosthetic weight bearing tolerance: No reported changes Edema: To be assessed  Residual limb condition: not assessed  Prosthetic description: Ischial Containment Socket with flexible inner socket, silicon liner with suction ring suspension, microprocessor knee K code/activity level with prosthetic use: Level 3   TREATMENT:  Self-care/home management  Assessed vitals (see above) and systolic BP elevated but within limits for session  Reaffirmed last therapist recommendation of assessment for new socket and inquiring with prosthetist about recommending advantageous changes like suspension type, etc.  Discussed his current socket and recent adjustments to fit and relying on sock ply adjustment for decreased rotation.  NMR  6 step up and over progressing to intermittent touch to // bars only over several reps working on  LLE step up and down, he does better w/ partial foot off step to facilitate knee bend on descent 2-4 obstacle navigation progressing to no UE support over several reps w/ LLE leading SBA-CGA  GAIT: Gait pattern: step through pattern, decreased trunk rotation, trunk flexed, and narrow BOS Distance walked: 200 ft + 230 ft Assistive device utilized: stand alone cane and None Level of assistance: CGA Comments: Light reliance on cane during initial bout, he does have singular left LOB due to narrowed BOS resulting in him catching himself with wall, he better corrects BOS w/ min cues.  Second bout w/o cane w/ some pull to the left w/ RLE advancement, unsure if this is due to LLE length from not sitting as far into socket or a compensatory pattern.  He has singular left LOB w/ self correction using step during left turning on track.  RAMP:  Level of Assistance: CGA Assistive device utilized: None Ramp Comments: Pt has better reciprocal pattern on ascent relying on smaller step-to pattern for safe descent.  He remains cautious without instability today on 2 attempts.  2 additional attempts on ascent using cane in preparation for step off curb practice.  CURB:  Level of Assistance: CGA and Mod A Assistive device utilized: Hurrycane  Curb Comments: Attempted step off  w/ prosthetic leading w/ no instability or rotation noted today.  Repeated second attempt w/ RLE leading with pt having good carryover from prior step activity positioning foot to facilitate knee bend without ability to safely dismount step as prosthetic knee would not bend.  Continued to recommend prosthetic leading during step off and use of cane as iterated last session.   PATIENT EDUCATION: Education details: Continue HEP, encouragement to be fitted for new socket - finishing remaining visits and holding/discharging until he is fitted for a new socket.  Scheduling changes to next weeks appt per pt and daughter request as they have a  scheduling conflict- this PT without availability at alternate times, but prior PT available and they are agreeable to change visit to this therapist. Person educated: Patient and Child(ren) Education method: Explanation, demonstration, verbal cues and handouts Education comprehension: verbalized understanding, returned demonstration, verbal cues required, needs further education      HOME EXERCISE PROGRAM: Access Code: [email or web address] URL: [email or web address] Date: 07/16/2023 Prepared by: Marlon Plaster  Exercises - Staggered Sit-to-Stand  - 1 x daily - 7 x weekly - 3 sets - 10 reps -ECCENTRIC focus of STS in sturdy chair riding MPK component symmetrically as practiced 5/15 -Reaching to chair back or seat flexing the MPK for weight bearing -Stride weight shifts -Walking unsupported beside countertop - Standing Quarter Turn with Counter Support  - 1 x daily - 7 x weekly - 3 sets - 10 reps   AMPUTEE SINK HEP (Provided to pt and daughter in [place] and spanish 5/16) Haz cada ejercicio 1-2 veces al da. Haz cada ejercicio 10 repeticiones. Mantenga cada ejercicio durante 2 segundos para sentir su ubicacin.   EN EL FREGADERO ENCUENTRE SU POSICIN EN LA LNEA MEDIA Y COLOQUE LOS PIES A LA IGUAL DISTANCIA DE LA LNEA MEDIA. Trate de encontrar esta posicin cuando est quieto para Arts development officer.   UTILICE CINTA EN EL PISO PARA MARCAR LA POSICIN DE LA LNEA MEDIA. Tambin debe intentar sentir con la extremidad la presin en la cavidad. Ests tratando de sentir con las extremidades lo que solas sentir con la planta del pie.   1. Desplazamiento de lado a lado: moviendo solo las caderas (no los hombros): [employer] el peso hacia la pierna izquierda, MANTENER/SENTIR. Vuelva a colocar el mismo peso en cada pierna, MANTENER/SENTIR. Mueva el peso sobre su pierna derecha, MANTENER/SENTIR. Vuelva a colocar el mismo peso en cada pierna, MANTENER/SENTIR. Repetir. Comience con ambas manos en  el lavabo, avance solo hacia la derecha y luego sin manos. 2. Cambio de adelante hacia atrs: moviendo solo las caderas (no los hombros): [employer] el peso hacia adelante [employer] dedos de los pies, MANTENER/SENTIR. Mueva su peso hacia atrs para igualar el pie plano en ambas piernas, MANTENER/SENTIR. Mueva su peso nuevamente sobre sus talones, MANTENER/SENTIR. Mueva su peso hacia atrs para igualarlo en ambas piernas, MANTENER/SENTIR. Repetir. comience con ambas manos en el fregadero, avance solo [employer] mano derecha, luego sin [place]. 3. Conos/copas en movimiento: Con el mismo peso en cada pierna: Sujtese con una mano la primera vez, luego avance hasta no apoyar las manos. Mueva las tazas de un lado del fregadero al otro. Coloque los vasos a ~2" fuera de su alcance, avance hasta 10" ms all de su alcance. Coloque una mano en el medio del fregadero y extienda la mano con la otra. Haz ambos brazos. Luego coloque una mano y mueva las tazas con la otra. 4. Alcanzar hacia arriba/hacia sheryll: se alterna el alcance [employer]  gabinetes superiores o el techo si no hay gabinetes presentes. Mantenga el mismo peso en cada pierna. Comience con ignacia bass apoyada en el mostrador mientras la otra mano se extiende y avance hasta no apoyar la mano al [place]. Coloque una mano en el medio del fregadero y extindala con la [place]. Haz ambos brazos. Luego coloque una mano y mueva las tazas con la otra. 5. Mirando por encima de los hombros: Con el mismo peso en cada pierna: gire alternativamente para mirar por encima de los hombros con una mano apoyada en el mostrador segn sea necesario. Comience con movimientos de la [place] solo para mirar delante del hombro, luego incluso con el hombro y avance hasta mirar detrs de usted. Para mirar hacia un lado, mueva la cabeza/los ojos, luego el hombro del costado que mira [place], cambie ms peso hacia el costado y tire de la cadera hacia atrs. Coloca una mano en el medio del  fregadero y suelta la otra para que tu hombro pueda retroceder. Cambia de mano para mirar hacia otro lado. Luego coloque una mano y mueva las tazas con la otra. 6. Al pisar con una pierna que no est amputada: retire de su camino los artculos que se encuentran debajo del gabinete. Mueva las caderas/pelvis para que el peso recaiga sobre la prtesis. PASE LENTAMENTE la otra pierna de modo que la parte delantera del pie quede dentro del gabinete. Luego retroceda al piso.     Walking Program Using RW  (Por favor use su andador.): [employer] caminar durante un cierto perodo de tiempo cada da                          Camine 10 minutos 1 vez al da.             Aumentar 2 minutos cada 7 das              Trabaje hasta 20 minutos seguidos (1 vez al da).               Ejemplo:                         Da 1-2           4-5 minutos     3 veces al da                         Da 7-8           10-12 minutos 2-3 veces al da                         Da 13-14       20-22 minutos 1-2 veces al da  ASSESSMENT:  CLINICAL IMPRESSION: Emphasis of skilled PT session on continuation of gait training and NMR to improve prosthetic componentry management.  He struggles to facilitate flexion with MPK which showed some improvement with step task today, but was unable to be carried over to curb management.  He would likely benefit from having his socket re-done and this was conveyed to him and daughter today.  His daughter will initiate scheduling a [facility] appt with Delaney.  Plan to finish scheduled appts and adjust POC to reflect time needed for prosthetic changes. Continue per POC.   OBJECTIVE IMPAIRMENTS: Abnormal gait, decreased activity tolerance, decreased balance, decreased coordination, decreased knowledge of use of  DME, difficulty walking, improper body mechanics, and prosthetic dependency .   ACTIVITY LIMITATIONS: carrying, lifting, bending, standing, squatting, stairs, transfers, and locomotion  level  PARTICIPATION LIMITATIONS: meal prep, cleaning, community activity, occupation, and yard work  PERSONAL FACTORS: Fitness, Past/current experiences, and 3+ comorbidities: see PMH are also affecting patient's functional outcome.   REHAB POTENTIAL: Good  CLINICAL DECISION MAKING: Evolving/moderate complexity  EVALUATION COMPLEXITY: Moderate   GOALS: Goals reviewed with patient? Yes  STG = LTG due to POC Length    LONG TERM GOALS:  Target date: 08/07/2023  Pt will improve 5 x STS to less than or equal to 15 seconds w/proper body mechanics to demonstrate improved functional strength and transfer efficiency.   Baseline: 16.03s w/LLE in kickstand, retropulsion and poor eccentric control; 15.32s w/LLE still in kickstand and bracing against mat w/RLE  Goal status: NOT MET  2.  Pt will report no falls w/microprocessor knee since eval for improved independence and reduced fall risk  Baseline: >10 falls; pt had fall at [facility]  Goal status: NOT MET  3.  Pt will improve gait velocity to at least 3.0 ft/s w/LRAD mod I for improved gait efficiency and functional use of LLE prosthetic    Baseline: 2.87 ft/s w/axillary crutch; 2.41 ft/s w/cane (5/28) Goal status: NOT MET  NEW SHORT TERM GOALS:   Target date: 09/17/2023  Pt will improve gait velocity to at least 2.7 ft/s w/LRAD for improved gait efficiency and independence   Baseline: 2.41 ft/s w/tripod cane Goal status: INITIAL  2.  Pt will perform quarter turns w/no UE support and SBA for improved safety w/mobility  Baseline: CGA Goal status: INITIAL  3.  Berg to be assessed and LTG updated  Baseline:  Goal status: INITIAL    NEW LONG TERM GOALS:  Target date: 10/15/2023  Pt will improve 5 x STS to less than or equal to 15 seconds w/proper body mechanics to demonstrate improved functional strength and transfer efficiency. Baseline: 16.03s w/LLE in kickstand, retropulsion and poor eccentric control; 15.32s w/LLE still in  kickstand and bracing against mat w/RLE (5/28) Goal status: INITIAL  2.  Pt will improve gait velocity to at least 3.0 ft/s w/LRAD for improved gait efficiency and independence   Baseline:  Goal status: INITIAL  3.  Berg goal  Baseline:  Goal status: INITIAL    PLAN:  PT FREQUENCY: 1-2x/week  PT DURATION:10 weeks (to allow for [place] to be out of country)   PLANNED INTERVENTIONS: [id number]- PT Re-evaluation, 97750- Physical Performance Testing, 97110-Therapeutic exercises, 97530- Therapeutic activity, [id number]- Neuromuscular re-education, [phone]- Self Care, [id number]- Gait training, [phone]- Orthotic/Prosthetic subsequent, Patient/Family education, Balance training, Stair training, Dry Needling, DME instructions, and Gait training  PLAN FOR NEXT SESSION: Monitor BP. Review/update HEP prn. Facilitation of L knee flexion in weightbearing (ramps/curbs), sit to stands; stair training, terminal stance knee flexion. Progression of gait training to no AD overground, turns!  STG!  Did he schedule appt for socket revision?  Daved KATHEE Bull, PT, DPT 09/10/2023, 5:06 PM

## 2023-09-17 ENCOUNTER — Ambulatory Visit: Admitting: Physical Therapy

## 2023-09-19 ENCOUNTER — Ambulatory Visit: Payer: Self-pay | Admitting: Physical Therapy

## 2023-09-19 VITALS — BP 134/72 | HR 67

## 2023-09-19 DIAGNOSIS — M6281 Muscle weakness (generalized): Secondary | ICD-10-CM | POA: Diagnosis not present

## 2023-09-19 DIAGNOSIS — R2681 Unsteadiness on feet: Secondary | ICD-10-CM

## 2023-09-19 DIAGNOSIS — R2689 Other abnormalities of gait and mobility: Secondary | ICD-10-CM | POA: Diagnosis not present

## 2023-09-19 DIAGNOSIS — Z9181 History of falling: Secondary | ICD-10-CM

## 2023-09-19 NOTE — Therapy (Signed)
 OUTPATIENT PHYSICAL THERAPY PROSTHETIC TREATMENT   Patient Name: Tim Vincent MRN: [id number] DOB:02/07/1956, 68 y.o., male Today's Date: 09/19/2023  PCP: Tim Harder, NP  REFERRING PROVIDER: Jerona Sage, MD    END OF SESSION:  PT End of Session - 09/19/23 1331     Visit Number 12    Number of Visits 17   Recert   Date for PT Re-Evaluation 10/15/23   recert - to allow for pt to be in [place] for 2 weeks   Authorization Type [employer] Dual Complete    PT Start Time 1328   Pt arrived late   PT Stop Time 1400    PT Time Calculation (min) 32 min    Equipment Utilized During Treatment Gait belt   L AKA w/microprocessor knee   Activity Tolerance Patient tolerated treatment well    Behavior During Therapy WFL for tasks assessed/performed          Past Medical History:  Diagnosis Date   Cancer (HCC)    prostate ca   Constipation    Diabetes mellitus without complication (HCC)    type 2   Gout    HLD (hyperlipidemia)    Hypertension    Past Surgical History:  Procedure Laterality Date   AMPUTATION Left 04/17/2022   Procedure: LEFT ABOVE KNEE AMPUTATION;  Surgeon: Vincent Tim GAILS, MD;  Location: [facility];  Service: Orthopedics;  Laterality: Left;   MASS EXCISION Left 04/17/2021   Procedure: EXCISION LEFT ELBOW MASS;  Surgeon: Tim Craig RAMAN, MD;  Location: [facility];  Service: Plastics;  Laterality: Left;  1 hour   MASS EXCISION Left 08/10/2021   Procedure: EXCISION ELBOW MASS;  Surgeon: Tim Craig RAMAN, MD;  Location: [facility];  Service: Plastics;  Laterality: Left;  1 hour   MASS EXCISION Right 10/16/2022   Procedure: EXCISION OF MASS RIGHT ELBOW AND ARM;  Surgeon: Tim Leonce NOVAK, MD;  Location: [facility];  Service: Plastics;  Laterality: Right;  1.5 hours   Patient Active Problem List   Diagnosis Date Noted   Diabetic infection of left foot (HCC) 04/17/2022   Necrotizing fasciitis of lower leg (HCC) 04/17/2022   Dyslipidemia 04/17/2022    Essential hypertension 01/12/2021   Obesity 01/12/2021   Skin sensation disturbance 01/12/2021   Type 2 diabetes mellitus without complication (HCC) 01/12/2021   Diabetic neuropathy (HCC) 01/12/2021   Hav (hallux abducto valgus), unspecified laterality 01/12/2021   Cough 10/31/2017   Elevated blood pressure reading 10/31/2017    ONSET DATE: 03/20/2023 MD referral to PT  REFERRING DIAG: S10.387 (ICD-10-CM) - Left above-knee amputee   THERAPY DIAG:  Other abnormalities of gait and mobility  Muscle weakness (generalized)  Unsteadiness on feet  History of falling  Rationale for Evaluation and Treatment: Rehabilitation  SUBJECTIVE:   SUBJECTIVE STATEMENT: Pt presents w/SPC and daughter. Reports he thinks he is at his max potential with his current socket but was told he needs an order for a new socket and does not see Dr. Sage until 7/31. No falls.    Pt accompanied by: family member daughter interprets for session and is [facility] certified to do so, patient denies another interpreter   PERTINENT HISTORY: DM, neuropathy, HTN, left AKA 04/17/22, left elbow mass excision 04/17/21, 08/10/21 & 10/16/22, prostate CA, gout  PAIN:  Are you having pain? No  PRECAUTIONS: None  WEIGHT BEARING RESTRICTIONS: No  FALLS: Has patient fallen in last 6 months? Yes. 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  He lifts up to 40-50#.   PLOF: Independent, Independent with household mobility without device, and Independent with community mobility without device  PATIENT GOALS:  walk without any support   OBJECTIVE:   COGNITION: Overall cognitive  status: Within functional limits for tasks assessed  POSTURE: rounded shoulders, forward head, and flexed trunk   BED MOBILITY:  Independent all aspects and no changes reported   TRANSFERS: Sit to stand: Modified independence with armrests and able to stabilize Stand to sit: Modified independence using armrests to control descent.    VITALS: LUE before interventions Vitals:   09/19/23 1345  BP: 134/72  Pulse: 67        GAIT: Gait pattern: step through pattern, decreased step length- Right, decreased stance time- Left, circumduction- Left, Left hip hike, lateral hip instability, lateral lean- Right, and trunk flexed Distance walked: Various clinic distances  Assistive device utilized: Transfemoral Prosthesis and tripod cane Level of assistance: Modified independence   CURRENT PROSTHETIC WEAR ASSESSMENT: Patient is independent with: skin check, residual limb care, prosthetic cleaning, ply sock cleaning, and correct ply sock adjustment Donning prosthesis: Modified independence Doffing prosthesis: Modified independence Prosthetic wear tolerance: pt reports daily for most of awake hours every day Prosthetic weight bearing tolerance: No reported changes Edema: To be assessed  Residual limb condition: not assessed  Prosthetic description: Ischial Containment Socket with flexible inner socket, silicon liner with suction ring suspension, microprocessor knee K code/activity level with prosthetic use: Level 3   TREATMENT:  Self-care/home management  Assessed vitals (see above) and systolic BP elevated but within limits for session  POC   Ther Act   Tim Vincent PT Assessment - 09/19/23 1351       Ambulation/Gait   Gait velocity 32.8' over 10.87s = 3.02 ft/s w/tripod cane         Pt Quarter turns   PATIENT EDUCATION: Education details: Continue HEP, encouragement to be fitted for new socket - finishing remaining visits and holding/discharging until he is fitted for a new  socket.  Scheduling changes to next weeks appt per pt and daughter request as they have a scheduling conflict- this PT without availability at alternate times, but prior PT available and they are agreeable to change visit to this therapist. Person educated: Patient and Child(ren) Education method: Explanation, demonstration, verbal cues and handouts Education comprehension: verbalized understanding, returned demonstration, verbal cues required, needs further education      HOME EXERCISE PROGRAM: Access Code: [email or web address] URL: [email or web address] Date: 07/16/2023 Prepared by: Marlon Debora Stockdale  Exercises - Staggered Sit-to-Stand  - 1 x daily - 7 x weekly - 3 sets - 10 reps -ECCENTRIC focus of STS in sturdy chair riding MPK component symmetrically as practiced 5/15 -Reaching to chair back or seat flexing the MPK for weight bearing -Stride weight shifts -Walking unsupported beside countertop - Standing Quarter Turn with Counter Support  - 1 x daily - 7 x weekly - 3 sets - 10 reps   AMPUTEE SINK HEP (Provided to pt and daughter in [place] and spanish 5/16) Haz cada ejercicio 1-2 veces al da. Haz cada ejercicio 10 repeticiones. Mantenga cada ejercicio durante 2 segundos para sentir su ubicacin.  EN EL FREGADERO ENCUENTRE SU POSICIN EN LA LNEA MEDIA Y COLOQUE LOS PIES A LA IGUAL DISTANCIA DE LA LNEA MEDIA. Trate de encontrar esta posicin cuando est quieto para Arts development officer.   UTILICE CINTA EN EL PISO PARA MARCAR LA POSICIN DE LA LNEA MEDIA. Tambin debe intentar sentir con la extremidad la presin en la cavidad. Ests tratando de sentir con las extremidades lo que solas sentir con la planta del pie.   1. Desplazamiento de lado a lado: moviendo solo las caderas (no los hombros): [employer] el peso hacia la pierna izquierda, MANTENER/SENTIR. Vuelva a colocar el mismo peso en cada pierna, MANTENER/SENTIR. Mueva el peso sobre su pierna derecha, MANTENER/SENTIR. Vuelva a  colocar el mismo peso en cada pierna, MANTENER/SENTIR. Repetir. Comience con ambas manos en el lavabo, avance solo hacia la derecha y luego sin manos. 2. Cambio de adelante hacia atrs: moviendo solo las caderas (no los hombros): [employer] el peso hacia adelante [employer] dedos de los pies, MANTENER/SENTIR. Mueva su peso hacia atrs para igualar el pie plano en ambas piernas, MANTENER/SENTIR. Mueva su peso nuevamente sobre sus talones, MANTENER/SENTIR. Mueva su peso hacia atrs para igualarlo en ambas piernas, MANTENER/SENTIR. Repetir. comience con ambas manos en el fregadero, avance solo [employer] mano derecha, luego sin [place]. 3. Conos/copas en movimiento: Con el mismo peso en cada pierna: Sujtese con una mano la primera vez, luego avance hasta no apoyar las manos. Mueva las tazas de un lado del fregadero al otro. Coloque los vasos a ~2" fuera de su alcance, avance hasta 10" ms all de su alcance. Coloque una mano en el medio del fregadero y extienda la mano con la otra. Haz ambos brazos. Luego coloque una mano y mueva las tazas con la otra. 4. Alcanzar hacia arriba/hacia sheryll: se alterna el alcance hasta los gabinetes superiores o el techo si no hay gabinetes presentes. Mantenga el mismo peso en cada pierna. Comience con ignacia bass apoyada en el mostrador mientras la otra mano se extiende y avance hasta no apoyar la mano al Barista. Coloque una mano en el medio del fregadero y extindala con la [place]. Haz ambos brazos. Luego coloque una mano y mueva las tazas con la otra. 5. Mirando por encima de los hombros: Con el mismo peso en cada pierna: gire alternativamente para mirar por encima de los hombros con una mano apoyada en el mostrador segn sea necesario. Comience con movimientos de la [place] solo para mirar delante del hombro, luego incluso con el hombro y avance hasta mirar detrs de usted. Para mirar hacia un lado, mueva la cabeza/los ojos, luego el hombro del costado que mira [place], cambie ms  peso hacia el costado y tire de la cadera hacia atrs. Coloca una mano en el medio del fregadero y suelta la otra para que tu hombro pueda retroceder. Cambia de mano para mirar hacia otro lado. Luego coloque una mano y mueva las tazas con la otra. 6. Al pisar con una pierna que no est amputada: retire de su camino los artculos que se encuentran debajo del gabinete. Mueva las caderas/pelvis para que el peso recaiga sobre la prtesis. PASE LENTAMENTE la otra pierna de modo que la parte delantera del pie quede dentro del gabinete. Luego retroceda al piso.     Walking Program Using RW  (Por favor use su andador.): [employer] caminar durante un cierto perodo de tiempo cada da  Camine 10 minutos 1 vez al da.             Aumentar 2 minutos cada 7 das              Trabaje hasta 20 minutos seguidos (1 vez al da).               Ejemplo:                         Da 1-2           4-5 minutos     3 veces al da                         Da 7-8           10-12 minutos 2-3 veces al da                         Da 13-14       20-22 minutos 1-2 veces al da  ASSESSMENT:  CLINICAL IMPRESSION: Emphasis of skilled PT session on continuation of gait training and NMR to improve prosthetic componentry management.  He struggles to facilitate flexion with MPK which showed some improvement with step task today, but was unable to be carried over to curb management.  He would likely benefit from having his socket re-done and this was conveyed to him and daughter today.  His daughter will initiate scheduling a [facility] appt with Delaney.  Plan to finish scheduled appts and adjust POC to reflect time needed for prosthetic changes. Continue per POC.   OBJECTIVE IMPAIRMENTS: Abnormal gait, decreased activity tolerance, decreased balance, decreased coordination, decreased knowledge of use of DME, difficulty walking, improper body mechanics, and prosthetic dependency .   ACTIVITY LIMITATIONS: carrying,  lifting, bending, standing, squatting, stairs, transfers, and locomotion level  PARTICIPATION LIMITATIONS: meal prep, cleaning, community activity, occupation, and yard work  PERSONAL FACTORS: Fitness, Past/current experiences, and 3+ comorbidities: see PMH are also affecting patient's functional outcome.   REHAB POTENTIAL: Good  CLINICAL DECISION MAKING: Evolving/moderate complexity  EVALUATION COMPLEXITY: Moderate   GOALS: Goals reviewed with patient? Yes  STG = LTG due to POC Length    LONG TERM GOALS:  Target date: 08/07/2023  Pt will improve 5 x STS to less than or equal to 15 seconds w/proper body mechanics to demonstrate improved functional strength and transfer efficiency.   Baseline: 16.03s w/LLE in kickstand, retropulsion and poor eccentric control; 15.32s w/LLE still in kickstand and bracing against mat w/RLE  Goal status: NOT MET  2.  Pt will report no falls w/microprocessor knee since eval for improved independence and reduced fall risk  Baseline: >10 falls; pt had fall at [facility]  Goal status: NOT MET  3.  Pt will improve gait velocity to at least 3.0 ft/s w/LRAD mod I for improved gait efficiency and functional use of LLE prosthetic    Baseline: 2.87 ft/s w/axillary crutch; 2.41 ft/s w/cane (5/28) Goal status: NOT MET  NEW SHORT TERM GOALS:   Target date: 09/17/2023  Pt will improve gait velocity to at least 2.7 ft/s w/LRAD for improved gait efficiency and independence   Baseline: 2.41 ft/s w/tripod cane; 3.02 ft/s w/tripod cane (7/11) Goal status: MET  2.  Pt will perform quarter turns w/no UE support and SBA for improved safety w/mobility  Baseline: CGA; SBA Goal status: MET  3.  Berg to be assessed  and LTG updated  Baseline:  Goal status: INITIAL    NEW LONG TERM GOALS:  Target date: 10/15/2023  Pt will improve 5 x STS to less than or equal to 15 seconds w/proper body mechanics to demonstrate improved functional strength and transfer  efficiency. Baseline: 16.03s w/LLE in kickstand, retropulsion and poor eccentric control; 15.32s w/LLE still in kickstand and bracing against mat w/RLE (5/28) Goal status: INITIAL  2.  Pt will improve gait velocity to at least 3.0 ft/s w/LRAD for improved gait efficiency and independence   Baseline:  Goal status: INITIAL  3.  Berg goal  Baseline:  Goal status: INITIAL    PLAN:  PT FREQUENCY: 1-2x/week  PT DURATION:10 weeks (to allow [place] of [place])   PLANNED INTERVENTIONS: 02835- PT Re-evaluation, 97750- Physical Performance Testing, 97110-Therapeutic exercises, 97530- Therapeutic activity, [id number]- Neuromuscular re-education, [phone]- Self Care, 02883- Gait training, [phone]- Orthotic/Prosthetic subsequent, Patient/Family education, Balance training, Stair training, Dry Needling, DME instructions, and Gait training  PLAN FOR NEXT SESSION: Monitor BP. Review/update HEP prn. Facilitation of L knee flexion in weightbearing (ramps/curbs), sit to stands; stair training, terminal stance knee flexion. Progression of gait training to no AD overground, turns!  STG!  Did he schedule appt for socket revision?  Beverlee Wilmarth E Briley Bumgarner, PT, DPT 09/19/2023, 2:00 PM

## 2023-09-24 ENCOUNTER — Ambulatory Visit: Admitting: Physical Therapy

## 2023-10-01 ENCOUNTER — Ambulatory Visit: Admitting: Physical Therapy

## 2023-10-08 ENCOUNTER — Ambulatory Visit: Admitting: Physical Therapy

## 2023-10-09 ENCOUNTER — Ambulatory Visit (INDEPENDENT_AMBULATORY_CARE_PROVIDER_SITE_OTHER): Admitting: Orthopedic Surgery

## 2023-10-09 DIAGNOSIS — E119 Type 2 diabetes mellitus without complications: Secondary | ICD-10-CM | POA: Diagnosis not present

## 2023-10-09 DIAGNOSIS — Z89612 Acquired absence of left leg above knee: Secondary | ICD-10-CM | POA: Diagnosis not present

## 2023-10-09 DIAGNOSIS — I10 Essential (primary) hypertension: Secondary | ICD-10-CM | POA: Diagnosis not present

## 2023-10-10 ENCOUNTER — Ambulatory Visit: Payer: Self-pay | Attending: Orthopedic Surgery | Admitting: Physical Therapy

## 2023-10-14 ENCOUNTER — Encounter: Payer: Self-pay | Admitting: Orthopedic Surgery

## 2023-10-14 NOTE — [facility] Notes (Signed)
 Office Visit Note   Patient: Tim Vincent           Date of Birth: 05-27-1955           MRN: [id number] Visit Date: 10/09/2023              Requested by: Celestia Harder, NP [address] [place],  [place] [address] PCP: Celestia Harder, NP  Chief Complaint  Patient presents with   Left Leg - Follow-up    Hx AKA 04/2022      HPI: Patient is a 68 year old gentleman who is status post left above-knee amputation.  Patient has had significant decreased volume in the residual limb and is subsiding into his socket.  Assessment & Plan: Visit Diagnoses:  1. Left above-knee amputee [facility])     Plan: Patient was provided a prescription for [facility] for a new socket liner and supplies.  Follow-Up Instructions: No follow-ups on file.   Ortho Exam  Patient is alert, oriented, no adenopathy, well-dressed, normal affect, normal respiratory effort. Examination patient is in bearing callus on the residual limb and pending skin breakdown proximally from subsiding into the socket.  He is currently ambulating with 1 crutch.  Patient is an existing left transfemoral amputee.  Patient's current comorbidities are not expected to impact the ability to function with the prescribed prosthesis. Patient verbally communicates a strong desire to use a prosthesis. Patient currently requires mobility aids to ambulate without a prosthesis.  Expects not to use mobility aids with a new prosthesis. Patient is expected to resume or reach their K Level within 6 months. Patient was active before the amputation and independent with stairs, uneven terrain, varying cadence, and a community ambulator.  Patient is a K2 level ambulator that will use a prosthesis to walk around their home and the community over low level environmental barriers.        Imaging: No results found. No images are attached to the encounter.  Labs: Lab Results  Component Value Date   HGBA1C 6.3 (H) 04/17/2022    LABURIC 7.8 04/21/2022   REPTSTATUS 04/21/2022 FINAL 04/17/2022   GRAMSTAIN  04/17/2022    NO WBC SEEN FEW GRAM NEGATIVE RODS FEW GRAM POSITIVE COCCI    CULT  04/17/2022    FEW ENTEROCOCCUS FAECALIS FEW STREPTOCOCCUS MITIS/ORALIS MODERATE PREVOTELLA BIVIA BETA LACTAMASE POSITIVE Performed at [facility], [address] [place], [place] [address]    [facility] ENTEROCOCCUS FAECALIS 04/17/2022   LABORGA STREPTOCOCCUS MITIS/ORALIS 04/17/2022     Lab Results  Component Value Date   ALBUMIN  2.5 (L) 04/16/2022    Lab Results  Component Value Date   MG 2.1 04/18/2022   No results found for: VD25OH  No results found for: PREALBUMIN    Latest Ref Rng & Units 10/16/2022   12:39 PM 04/21/2022    2:19 AM 04/19/2022    2:17 AM  CBC EXTENDED  WBC 4.0 - 10.5 K/uL 7.5  10.6  12.4   RBC 4.22 - 5.81 MIL/uL 3.19  3.87  3.50   Hemoglobin 13.0 - 17.0 g/dL 9.7  88.8  89.7   HCT 60.9 - 52.0 % 28.8  35.8  32.4   Platelets 150 - 400 K/uL 174  304  299      There is no height or weight on file to calculate BMI.  Orders:  No orders of the defined types were placed in this encounter.  No orders of the defined types were placed in this  encounter.    Procedures: No procedures performed  Clinical Data: No additional findings.  ROS:  All other systems negative, except as noted in the HPI. Review of Systems  Objective: Vital Signs: There were no vitals taken for this visit.  Specialty Comments:  No specialty comments available.  PMFS History: Patient Active Problem List   Diagnosis Date Noted   Diabetic infection of left foot (HCC) 04/17/2022   Necrotizing fasciitis of lower leg (HCC) 04/17/2022   Dyslipidemia 04/17/2022   Essential hypertension 01/12/2021   Obesity 01/12/2021   Skin sensation disturbance 01/12/2021   Type 2 diabetes mellitus without complication (HCC) 01/12/2021   Diabetic neuropathy (HCC) 01/12/2021   Hav (hallux abducto valgus), unspecified  laterality 01/12/2021   Cough 10/31/2017   Elevated blood pressure reading 10/31/2017   Past Medical History:  Diagnosis Date   Cancer [facility])    prostate ca   Constipation    Diabetes mellitus without complication (HCC)    type 2   Gout    HLD (hyperlipidemia)    Hypertension     History reviewed. No pertinent family history.  Past Surgical History:  Procedure Laterality Date   AMPUTATION Left 04/17/2022   Procedure: LEFT ABOVE KNEE AMPUTATION;  Surgeon: Harden Jerona GAILS, MD;  Location: [facility] OR;  Service: Orthopedics;  Laterality: Left;   MASS EXCISION Left 04/17/2021   Procedure: EXCISION LEFT ELBOW MASS;  Surgeon: [name], MD;  Location: [facility];  Service: Plastics;  Laterality: Left;  1 hour   MASS EXCISION Left 08/10/2021   Procedure: EXCISION ELBOW MASS;  Surgeon: [name], MD;  Location: [facility];  Service: Plastics;  Laterality: Left;  1 hour   MASS EXCISION Right 10/16/2022   Procedure: EXCISION OF MASS RIGHT ELBOW AND ARM;  Surgeon: [name], MD;  Location: [facility];  Service: Plastics;  Laterality: Right;  1.5 hours   Social History   Occupational History   Not on file  Tobacco Use   Smoking status: Never   Smokeless tobacco: Never  Vaping Use   Vaping status: Never Used  Substance and Sexual Activity   Alcohol use: Not Currently    Alcohol/week: 6.0 standard drinks of alcohol    Types: 6 Cans of beer per week    Comment: Stopped ETOH in 05/2022   Drug use: Never   Sexual activity: Not Currently

## 2023-10-15 ENCOUNTER — Ambulatory Visit: Admitting: Physical Therapy

## 2023-10-15 ENCOUNTER — Telehealth: Payer: Self-pay | Admitting: Physical Therapy

## 2023-10-15 NOTE — Telephone Encounter (Signed)
 Called and spoke to pt's daughter, Tim Vincent, regarding update on pt's new AKA socket. Daughter reports that pt has been fitted for new socket and should receive the week of 8/22. Rescheduled today's appointment for 8/28 to allow time for pt to receive new socket prior to resuming PT. Daughter in agreement with plan.   Tim Vincent, PT, DPT

## 2023-10-23 DIAGNOSIS — E119 Type 2 diabetes mellitus without complications: Secondary | ICD-10-CM | POA: Diagnosis not present

## 2023-11-06 ENCOUNTER — Ambulatory Visit: Payer: Self-pay | Admitting: Physical Therapy

## 2023-11-28 DIAGNOSIS — E119 Type 2 diabetes mellitus without complications: Secondary | ICD-10-CM | POA: Diagnosis not present

## 2023-11-28 DIAGNOSIS — M2550 Pain in unspecified joint: Secondary | ICD-10-CM | POA: Diagnosis not present

## 2023-11-28 DIAGNOSIS — I10 Essential (primary) hypertension: Secondary | ICD-10-CM | POA: Diagnosis not present

## 2023-11-28 DIAGNOSIS — M21611 Bunion of right foot: Secondary | ICD-10-CM | POA: Diagnosis not present

## 2023-12-02 DIAGNOSIS — Z89612 Acquired absence of left leg above knee: Secondary | ICD-10-CM | POA: Diagnosis not present

## 2024-01-12 ENCOUNTER — Encounter: Payer: Self-pay | Admitting: Radiology
